# Patient Record
Sex: Male | Born: 1970
Health system: Southern US, Community
[De-identification: ages and names within clinical notes are randomized; demographics above are authoritative.]

## PROBLEM LIST (undated history)

## (undated) DIAGNOSIS — E119 Type 2 diabetes mellitus without complications: Secondary | ICD-10-CM

## (undated) DIAGNOSIS — K219 Gastro-esophageal reflux disease without esophagitis: Secondary | ICD-10-CM

## (undated) DIAGNOSIS — K649 Unspecified hemorrhoids: Secondary | ICD-10-CM

## (undated) DIAGNOSIS — E785 Hyperlipidemia, unspecified: Secondary | ICD-10-CM

## (undated) DIAGNOSIS — I10 Essential (primary) hypertension: Secondary | ICD-10-CM

## (undated) HISTORY — DX: Hyperlipidemia, unspecified: E78.5

## (undated) HISTORY — DX: Unspecified hemorrhoids: K64.9

## (undated) HISTORY — DX: Gastro-esophageal reflux disease without esophagitis: K21.9

---

## 2004-08-27 ENCOUNTER — Ambulatory Visit (HOSPITAL_COMMUNITY): Admission: RE | Admit: 2004-08-27 | Discharge: 2004-08-27 | Payer: Self-pay | Admitting: *Deleted

## 2013-08-10 ENCOUNTER — Ambulatory Visit: Payer: Self-pay | Admitting: Family Medicine

## 2013-08-10 VITALS — BP 118/80 | HR 59 | Temp 97.8°F | Resp 16 | Ht 76.0 in | Wt 266.0 lb

## 2013-08-10 DIAGNOSIS — Z024 Encounter for examination for driving license: Secondary | ICD-10-CM

## 2013-08-10 DIAGNOSIS — Z0289 Encounter for other administrative examinations: Secondary | ICD-10-CM

## 2013-08-10 NOTE — Progress Notes (Signed)
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 He is physically healthy except he has a history of an esophageal stricture about 9 years ago which had to be dilated. He's been on Prilosec for that, but is starting to get some symptoms again. He realizes it is probably can get something done again. He could not recall the name of the gastroenterologist so we gave him the information of people he can contact.  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 Single.  Review of systems: Unremarkable  Physical exam: Well-developed well-nourished tall young man in no major distress. TMs are normal. Eyes PERRLA. Throat clear. Neck supple without nodes. No carotid bruits. Chest clear. Heart regular without murmurs gallops or arrhythmias. Abdomen soft without masses or tenderness. No abdominal bruits. Normal male external genitalia with testes descended. No hernias. Extremities unremarkable. Skin unremarkable except for some scattered moles. On flexion his spine looks fine. Neuro essentially unremarkable.  Plan: DOT card for 2 years  Gave him the name of some gastroenterologists he can contact says he wants to followup on the GERD and esophageal reflux

## 2013-08-10 NOTE — Progress Notes (Signed)
ua-sp gr-1.015, protein-neg, glucose-neg, blood-neg

## 2017-02-28 ENCOUNTER — Encounter: Payer: Self-pay | Admitting: Internal Medicine

## 2017-03-09 ENCOUNTER — Ambulatory Visit (INDEPENDENT_AMBULATORY_CARE_PROVIDER_SITE_OTHER): Payer: 59 | Admitting: Internal Medicine

## 2017-03-09 ENCOUNTER — Encounter: Payer: Self-pay | Admitting: Internal Medicine

## 2017-03-09 VITALS — BP 130/90 | HR 74 | Ht 76.0 in | Wt 259.2 lb

## 2017-03-09 DIAGNOSIS — R151 Fecal smearing: Secondary | ICD-10-CM | POA: Diagnosis not present

## 2017-03-09 DIAGNOSIS — K219 Gastro-esophageal reflux disease without esophagitis: Secondary | ICD-10-CM | POA: Diagnosis not present

## 2017-03-09 DIAGNOSIS — K648 Other hemorrhoids: Secondary | ICD-10-CM

## 2017-03-09 NOTE — Progress Notes (Signed)
Patient ID: Tyrone Lee, male   DOB: 1971/02/22, 46 y.o.   MRN: [id number] HPI: Dolton Shaker is a 46 year old male with past medical history of GERD who self-referred for evaluation of hemorrhoidal symptoms. He is here alone today. He reports that for many years he has had intermittent issues with hemorrhoids. Several months ago he developed hemorrhoidal symptoms that were more persistent and fail to improve as quickly as before. He reports that he feels swollen tissue and bulging tissue particularly after bowel movement. He reports that he feels a "swelling in the anal canal. He endorses perianal itching as well as fecal smearing. He denies rectal bleeding, pain with passing stool and melena. He states that at times he feels that it is hard to "stay clean" after wiping. Bowel habits have not changed and he usually has 2 formed stools per day. Occasionally he will have loose stools with certain foods such as salads. He reports that he has taught himself to not "rush through" a bowel movement. He reports he would often try to have a bowel movement very quickly and move on. He denies abdominal pain. Denies family history of colon polyps and colon cancer. No family history of IBD.  He does report history of intermittent heartburn. He is taking Prilosec 20 mg daily. He reports this controls his symptoms completely. He states it "works great". No dysphagia or odynophagia. No nausea, vomiting or weight loss.  Past Medical History:  Diagnosis Date  . Acid reflux     History reviewed. No pertinent surgical history.  Outpatient Medications Prior to Visit  Medication Sig Dispense Refill  . omeprazole (PRILOSEC) 10 MG capsule Take 10 mg by mouth daily.     No facility-administered medications prior to visit.     Not on File  Family History  Problem Relation Age of Onset  . Alcoholism Mother   . Drug abuse Mother   . Alcoholism Father   . Lung cancer Maternal Grandfather   . Lung cancer  Paternal Grandfather   . Colon cancer Neg Hx   . Stomach cancer Neg Hx   . Esophageal cancer Neg Hx     Social History  Substance Use Topics  . Smoking status: Never Smoker  . Smokeless tobacco: Never Used  . Alcohol use Yes     Comment: weekends    ROS: As per history of present illness, otherwise negative  BP 130/90   Pulse 74   Ht 6\' 4"  (1.93 m)   Wt 259 lb 4 oz (117.6 kg)   BMI 31.56 kg/m  Constitutional: Well-developed and well-nourished. No distress. HEENT: Normocephalic and atraumatic.  Conjunctivae are normal.  No scleral icterus. Neck: Neck supple. Trachea midline. Cardiovascular: Normal rate, regular rhythm and intact distal pulses. No M/R/G Pulmonary/chest: Effort normal and breath sounds normal. No wheezing, rales or rhonchi. Abdominal: Soft, nontender, nondistended. Bowel sounds active throughout.  Rectal: no external lesions or external hemorrhoids are seen, in the posterior midline there is a skin pile with a pinpoint opening without tenderness, fluctuance or drainage, no rectal mass, soft brown stool in the vault Anoscopy: hypertrophied anal papillae and small internal hemorrhoids RA=LL>RP positions  Extremities: no clubbing, cyanosis, or edema Neurological: Alert and oriented to person place and time. Skin: Skin is warm and dry.  Psychiatric: Normal mood and affect. Behavior is normal.   ASSESSMENT/PLAN: 46 year old male with past medical history of GERD who self-referred for evaluation of hemorrhoidal symptoms.  1. Internal hemorrhoids -- cannot rule out a posterior  perirectal abscess which has subsequently healed. There is no evidence of tenderness or active inflammation today. He does have internal hemorrhoids seen by anoscopy. We discussed hemorrhoids and treatment options including banding and surgery. Symptoms include perianal swelling, discomfort, itching and fecal smearing. He denies a history of bleeding. After this discussion including the risks,  benefits and alternatives he would prefer to pursue hemorrhoidal banding.   PROCEDURE NOTE:  The patient presents with symptomatic grade 2 internal hemorrhoids, requesting rubber band ligation of his hemorrhoidal disease.  All risks, benefits and alternative forms of therapy were described and informed consent was obtained.   The anorectum was pre-medicated with 0.125% nitroglycerin ointment The decision was made to band the RA internal hemorrhoid, and the [place] was used to perform band ligation without complication.   Digital anorectal examination was then performed to assure proper positioning of the band, and to adjust the banded tissue as required.  The patient was discharged home without pain or other issues.  Dietary and behavioral recommendations were given and along with follow-up instructions.     The patient will return as scheduled for  follow-up and possible additional banding as required. No complications were encountered and the patient tolerated the procedure well.   2. Need for PCP -- he does not have a primary care physician established. He feels that this is a good idea for him and I agree. We will refer him to [facility] primary care.  3. CRC screening -- average risk, Caucasian male. Screening colonoscopy recommended at age 18.  89. GERD -- GERD without alarm symptoms. He will continue Prilosec. We discussed the risk benefits and alternatives to chronic PPI therapy.

## 2017-03-09 NOTE — Patient Instructions (Addendum)
[place] [facility] Phone [phone].  You have been scheduled for your 2nd hemorrhoidal banding on 05/03/17 @ 3:45 pm   HEMORRHOID [place]   1. The procedure you have had should have been relatively painless since the banding of the area involved does not have nerve endings and there is no pain sensation.  The rubber band cuts off the blood supply to the hemorrhoid and the band may fall off as soon as 48 hours after the banding (the band may occasionally be seen in the toilet bowl following a bowel movement). You may notice a temporary feeling of fullness in the rectum which should respond adequately to plain Tylenol or Motrin.  2. Following the banding, avoid strenuous exercise that evening and resume full activity the next day.  A sitz bath (soaking in a warm tub) or bidet is soothing, and can be useful for cleansing the area after bowel movements.     3. To avoid constipation, take two tablespoons of natural wheat bran, natural oat bran, flax, Benefiber or any over the counter fiber supplement and increase your water intake to 7-8 glasses daily.    4. Unless you have been prescribed anorectal medication, do not put anything inside your rectum for two weeks: No suppositories, enemas, fingers, etc.  5. Occasionally, you may have more bleeding than usual after the banding procedure.  This is often from the untreated hemorrhoids rather than the treated one.  Don't be concerned if there is a tablespoon or so of blood.  If there is more blood than this, lie flat with your bottom higher than your head and apply an ice pack to the area. If the bleeding does not stop within a half an hour or if you feel faint, call our office at [phone] or go to the emergency room.  6. Problems are not common; however, if there is a substantial amount of bleeding, severe pain, chills, fever or difficulty passing urine (very rare) or other problems, you should  call us at [phone] or report to the nearest emergency room.  7. Do not stay seated continuously for more than 2-3 hours for a day or two after the procedure.  Tighten your buttock muscles 10-15 times every two hours and take 10-15 deep breaths every 1-2 hours.  Do not spend more than a few minutes on the toilet if you cannot empty your bowel; instead re-visit the toilet at a later time.

## 2017-04-27 ENCOUNTER — Encounter: Payer: Self-pay | Admitting: *Deleted

## 2017-05-03 ENCOUNTER — Encounter: Payer: Self-pay | Admitting: Internal Medicine

## 2017-05-03 ENCOUNTER — Ambulatory Visit (INDEPENDENT_AMBULATORY_CARE_PROVIDER_SITE_OTHER): Payer: 59 | Admitting: Internal Medicine

## 2017-05-03 VITALS — BP 126/84 | HR 68 | Ht 76.0 in | Wt 255.4 lb

## 2017-05-03 DIAGNOSIS — K648 Other hemorrhoids: Secondary | ICD-10-CM

## 2017-05-03 DIAGNOSIS — R151 Fecal smearing: Secondary | ICD-10-CM

## 2017-05-03 NOTE — Progress Notes (Signed)
Patient ID: Tyrone Lee, male   DOB: January 29, 1971, 46 y.o.   MRN: [id number] [name] is a 46 year old male with a history of symptomatic internal hemorrhoids who was initially seen on 03/09/2017. Hemorrhoidal symptoms prior to the first hemorrhoidal banding included perianal swelling, peripheral pain, itching and fecal smearing Hemorrhoidal banding was performed at last visit to the right anterior internal hemorrhoid  He reports this helped tremendously and his symptoms have improved significantly. He wishes to pursue hemorrhoidal banding again today. He asked to perform the 2 remaining banding's on the same visit today We discussed the slight increased risk of complication when applying to hemorrhoidal bands on the same visit including bleeding and pain. After a thorough discussion of the risks, benefits and alternatives to this therapy he wishes to proceed.   PROCEDURE NOTE:  The patient presents with symptomatic grade 2 internal hemorrhoids, requesting rubber band ligation of his hemorrhoidal disease.  All risks, benefits and alternative forms of therapy were described and informed consent was obtained.   The anorectum was pre-medicated with 0.125% nitroglycerin ointment The decision was made to band the LL and RP (RA done last visit) internal hemorrhoid, and the [place] was used to perform band ligation without complication.   Digital anorectal examination was then performed to assure proper positioning of the band, and to adjust the banded tissue as required.  The patient was discharged home without pain or other issues.  Dietary and behavioral recommendations were given and along with follow-up instructions.     The patient will return as needed. No complications were encountered and the patient tolerated the procedure well.   He is reminded to screening colonoscopy is recommended at age 89, which would be June 2020

## 2017-05-03 NOTE — Patient Instructions (Signed)
You will be due for a recall colonoscopy in 12/2018. We will send you a reminder in the mail when it gets closer to that time.  HEMORRHOID BANDING PROCEDURE    FOLLOW-UP CARE   1. The procedure you have had should have been relatively painless since the banding of the area involved does not have nerve endings and there is no pain sensation.  The rubber band cuts off the blood supply to the hemorrhoid and the band may fall off as soon as 48 hours after the banding (the band may occasionally be seen in the toilet bowl following a bowel movement). You may notice a temporary feeling of fullness in the rectum which should respond adequately to plain Tylenol or Motrin.  2. Following the banding, avoid strenuous exercise that evening and resume full activity the next day.  A sitz bath (soaking in a warm tub) or bidet is soothing, and can be useful for cleansing the area after bowel movements.     3. To avoid constipation, take two tablespoons of natural wheat bran, natural oat bran, flax, Benefiber or any over the counter fiber supplement and increase your water intake to 7-8 glasses daily.    4. Unless you have been prescribed anorectal medication, do not put anything inside your rectum for two weeks: No suppositories, enemas, fingers, etc.  5. Occasionally, you may have more bleeding than usual after the banding procedure.  This is often from the untreated hemorrhoids rather than the treated one.  Don't be concerned if there is a tablespoon or so of blood.  If there is more blood than this, lie flat with your bottom higher than your head and apply an ice pack to the area. If the bleeding does not stop within a half an hour or if you feel faint, call our office at [phone] or go to the emergency room.  6. Problems are not common; however, if there is a substantial amount of bleeding, severe pain, chills, fever or difficulty passing urine (very rare) or other problems, you should call us at [phone]  [phone] or report to the nearest emergency room.  7. Do not stay seated continuously for more than 2-3 hours for a day or two after the procedure.  Tighten your buttock muscles 10-15 times every two hours and take 10-15 deep breaths every 1-2 hours.  Do not spend more than a few minutes on the toilet if you cannot empty your bowel; instead re-visit the toilet at a later time.

## 2017-05-23 ENCOUNTER — Encounter: Payer: 59 | Admitting: Internal Medicine

## 2017-08-04 ENCOUNTER — Ambulatory Visit (INDEPENDENT_AMBULATORY_CARE_PROVIDER_SITE_OTHER): Payer: BLUE CROSS/BLUE SHIELD | Admitting: Family Medicine

## 2017-08-04 ENCOUNTER — Encounter: Payer: Self-pay | Admitting: Family Medicine

## 2017-08-04 ENCOUNTER — Other Ambulatory Visit: Payer: Self-pay

## 2017-08-04 VITALS — BP 141/90 | HR 60 | Temp 98.6°F | Resp 16 | Ht 75.0 in | Wt 250.2 lb

## 2017-08-04 DIAGNOSIS — R030 Elevated blood-pressure reading, without diagnosis of hypertension: Secondary | ICD-10-CM | POA: Diagnosis not present

## 2017-08-04 DIAGNOSIS — E1165 Type 2 diabetes mellitus with hyperglycemia: Secondary | ICD-10-CM | POA: Diagnosis not present

## 2017-08-04 DIAGNOSIS — R739 Hyperglycemia, unspecified: Secondary | ICD-10-CM

## 2017-08-04 DIAGNOSIS — Z13220 Encounter for screening for lipoid disorders: Secondary | ICD-10-CM | POA: Diagnosis not present

## 2017-08-04 DIAGNOSIS — R12 Heartburn: Secondary | ICD-10-CM | POA: Diagnosis not present

## 2017-08-04 LAB — GLUCOSE, POCT (MANUAL RESULT ENTRY): POC Glucose: 121 mg/dl — AB (ref 70–99)

## 2017-08-04 LAB — POCT GLYCOSYLATED HEMOGLOBIN (HGB A1C): Hemoglobin A1C: 11.4

## 2017-08-04 MED ORDER — BLOOD GLUCOSE METER KIT: PACK | 0 refills | Status: AC

## 2017-08-04 MED ORDER — METFORMIN HCL 500 MG PO TABS: 500.0000 mg | ORAL_TABLET | Freq: Two times a day (BID) | ORAL | 1 refills | Status: DC

## 2017-08-04 NOTE — Progress Notes (Addendum)
Subjective:  By signing my name below, I, Tyrone Lee, attest that this documentation has been prepared under the direction and in the presence of Tyrone Agreste, MD Electronically Signed: Ladene Lee, ED Scribe 08/04/2017 at 2:52 PM.   Patient ID: Tyrone Lee, male    DOB: 1971/04/18, 47 y.o.   MRN: [id number]  Chief Complaint  Patient presents with  . Hyperglycemia    patient presents to have lab work following elevated blood sugar during DOT physical, patient denies any sx at this time   HPI Tyrone Lee is a 47 y.o. male who presents to Primary Care at [facility] to f/u on elevated blood glucose. Pt last ate around 6 AM today. Pt recently had a DOT physical through work ~ 1 wk ago, had a finger stick blood glucose of 190, however, he was not fasting at that time. Pt was given a temporary DOT card. Denies polydipsia, polyuria, urinary frequency, visual disturbances, any symptoms. No family h/o DM or HTN. Pt is an occasional drinker ~once/month  Up to 10 beers, last drank over thanksgiving. Pt is a nonsmoker. He is exercising 2-3 days/wk. Denies cp or sob on exertion, lightheadedness, dizziness, weakness, blurred or darkening of vision, numbness/tingling in feet.  GERD Takes Prilosec 10 mg ~2-3 days/wk. Pt has made some behavioral modifications; states he no longer eats within several hours of going to bed. Denies heartburn, h/o stomach ulcer.  Immunizations Pt declines flu vaccine at this visit.   There are no active problems to display for this patient.  Past Medical History:  Diagnosis Date  . Acid reflux   . Hemorrhoids    No past surgical history on file. No Known Allergies Prior to Admission medications   Medication Sig Start Date End Date Taking? Authorizing Provider  omeprazole (PRILOSEC) 10 MG capsule Take 10 mg by mouth daily.    [provider]   Social History   Socioeconomic History  . Marital status: Married    Spouse name: Not on file  .  Number of children: 2  . Years of education: Not on file  . Highest education level: Not on file  Social Needs  . Financial resource strain: Not on file  . Food insecurity - worry: Not on file  . Food insecurity - inability: Not on file  . Transportation needs - medical: Not on file  . Transportation needs - non-medical: Not on file  Occupational History  . Not on file  Tobacco Use  . Smoking status: Never Smoker  . Smokeless tobacco: Never Used  Substance and Sexual Activity  . Alcohol use: Yes    Comment: weekends, occasional  . Drug use: No  . Sexual activity: Not on file  Other Topics Concern  . Not on file  Social History Narrative  . Not on file   Review of Systems  Eyes: Negative for visual disturbance.  Respiratory: Negative for shortness of breath.   Cardiovascular: Negative for chest pain.  Endocrine: Negative for polydipsia and polyuria.  Genitourinary: Negative for frequency.  Neurological: Negative for dizziness, weakness, light-headedness and numbness.      Objective:   Physical Exam  Constitutional: He is oriented to person, place, and time. He appears well-developed and well-nourished.  HENT:  Head: Normocephalic and atraumatic.  Eyes: EOM are normal. Pupils are equal, round, and reactive to light.  Neck: No JVD present. Carotid bruit is not present.  Cardiovascular: Normal rate, regular rhythm and normal heart sounds.  No murmur  heard. Pulmonary/Chest: Effort normal and breath sounds normal. He has no rales.  Musculoskeletal: He exhibits no edema.  Neurological: He is alert and oriented to person, place, and time.  Skin: Skin is warm and dry.  Psychiatric: He has a normal mood and affect.  Vitals reviewed.  Vitals:   08/04/17 1443 08/04/17 1635  BP: (!) 142/100 (!) 141/90  Pulse: 60   Resp: 16   Temp: 98.6 F (37 C)   TempSrc: Oral   SpO2: 99%   Weight: 250 lb 3.2 oz (113.5 kg)   Height: 6' 3"  (1.905 m)    Results for orders placed or  performed in visit on 08/04/17  POCT glucose (manual entry)  Result Value Ref Range   POC Glucose 121 (A) 70 - 99 mg/dl  POCT glycosylated hemoglobin (Hb A1C)  Result Value Ref Range   Hemoglobin A1C 11.4       Assessment & Plan:    Tyrone Lee is a 47 y.o. male Type 2 diabetes mellitus with hyperglycemia, without long-term current use of insulin ([place]) - Plan: Microalbumin, urine, metFORMIN (GLUCOPHAGE) 500 MG tablet, blood glucose meter kit and supplies Hyperglycemia - Plan: POCT glucose (manual entry), POCT glycosylated hemoglobin (Hb A1C), Microalbumin, urine  -Elevated A1c was slightly elevated glucose today consistent with diabetes, new onset. Current glucose reading not consistent with significant elevation in A1c.  -Check microalbumin, start metformin 500 mg daily for now, increasing to twice a day in few days.  -Glucose meter prescribed, monitor home readings and bring record of those to next visit to help decide on further changes.  Screening for hyperlipidemia - Plan: Comprehensive metabolic panel, Lipid panel  Elevated blood pressure reading - Plan: Comprehensive metabolic panel  -Borderline elevated, monitor at repeat visit.  Heartburn  -trigger foods discussed for avoidance, over-the-counter Prilosec as needed  Meds ordered this encounter  Medications  . metFORMIN (GLUCOPHAGE) 500 MG tablet    Sig: Take 1 tablet (500 mg total) by mouth 2 (two) times daily with a meal. Start initially QD for 1 week.    Dispense:  180 tablet    Refill:  1  . blood glucose meter kit and supplies    Sig: Dispense based on patient and insurance preference. Use up to four times daily as directed. (FOR ICD-10 E10.9, E11.9). Once per day for now.    Dispense:  1 each    Refill:  0    Order Specific Question:   Number of strips    Answer:   100    Order Specific Question:   Number of lancets    Answer:   100   Patient Instructions   Blood sugar test in office did indicate  diabetes. Three-month average appears much higher than blood sugar test today. Check your blood sugars once per day at various times including fasting or 2 hours after meals. Bring meter or copy of those readings to follow-up in 2 weeks. Tart metformin once per day for first few days then increase to twice per day if tolerated.  I will check cholesterol and other blood work for screening.   Ok to take Prilosec as needed. See typical foods that can worsen heartburn below.   Diabetes Mellitus and Nutrition When you have diabetes (diabetes mellitus), it is very important to have healthy eating habits because your blood sugar (glucose) levels are greatly affected by what you eat and drink. Eating healthy foods in the appropriate amounts, at about the same times every day,  can help you:  Control your blood glucose.  Lower your risk of heart disease.  Improve your blood pressure.  Reach or maintain a healthy weight.  Every person with diabetes is different, and each person has different needs for a meal plan. Your health care provider may recommend that you work with a diet and nutrition specialist (dietitian) to make a meal plan that is best for you. Your meal plan may vary depending on factors such as:  The calories you need.  The medicines you take.  Your weight.  Your blood glucose, blood pressure, and cholesterol levels.  Your activity level.  Other health conditions you have, such as heart or kidney disease.  How do carbohydrates affect me? Carbohydrates affect your blood glucose level more than any other type of food. Eating carbohydrates naturally increases the amount of glucose in your blood. Carbohydrate counting is a method for keeping track of how many carbohydrates you eat. Counting carbohydrates is important to keep your blood glucose at a healthy level, especially if you use insulin or take certain oral diabetes medicines. It is important to know how many carbohydrates you  can safely have in each meal. This is different for every person. Your dietitian can help you calculate how many carbohydrates you should have at each meal and for snack. Foods that contain carbohydrates include:  Bread, cereal, rice, pasta, and crackers.  Potatoes and corn.  Peas, beans, and lentils.  Milk and yogurt.  Fruit and juice.  Desserts, such as cakes, cookies, ice cream, and candy.  How does alcohol affect me? Alcohol can cause a sudden decrease in blood glucose (hypoglycemia), especially if you use insulin or take certain oral diabetes medicines. Hypoglycemia can be a life-threatening condition. Symptoms of hypoglycemia (sleepiness, dizziness, and confusion) are similar to symptoms of having too much alcohol. If your health care provider says that alcohol is safe for you, follow these guidelines:  Limit alcohol intake to no more than 1 drink per day for nonpregnant women and 2 drinks per day for men. One drink equals 12 oz of beer, 5 oz of wine, or 1 oz of hard liquor.  Do not drink on an empty stomach.  Keep yourself hydrated with water, diet soda, or unsweetened iced tea.  Keep in mind that regular soda, juice, and other mixers may contain a lot of sugar and must be counted as carbohydrates.  What are tips for following this plan? Reading food labels  Start by checking the serving size on the label. The amount of calories, carbohydrates, fats, and other nutrients listed on the label are based on one serving of the food. Many foods contain more than one serving per package.  Check the total grams (g) of carbohydrates in one serving. You can calculate the number of servings of carbohydrates in one serving by dividing the total carbohydrates by 15. For example, if a food has 30 g of total carbohydrates, it would be equal to 2 servings of carbohydrates.  Check the number of grams (g) of saturated and trans fats in one serving. Choose foods that have low or no amount of  these fats.  Check the number of milligrams (mg) of sodium in one serving. Most people should limit total sodium intake to less than 2,300 mg per day.  Always check the nutrition information of foods labeled as "low-fat" or "nonfat". These foods may be higher in added sugar or refined carbohydrates and should be avoided.  Talk to your dietitian to identify  your daily goals for nutrients listed on the label. Shopping  Avoid buying canned, premade, or processed foods. These foods tend to be high in fat, sodium, and added sugar.  Shop around the outside edge of the grocery store. This includes fresh fruits and vegetables, bulk grains, fresh meats, and fresh dairy. Cooking  Use low-heat cooking methods, such as baking, instead of high-heat cooking methods like deep frying.  Cook using healthy oils, such as olive, canola, or sunflower oil.  Avoid cooking with butter, cream, or high-fat meats. Meal planning  Eat meals and snacks regularly, preferably at the same times every day. Avoid going long periods of time without eating.  Eat foods high in fiber, such as fresh fruits, vegetables, beans, and whole grains. Talk to your dietitian about how many servings of carbohydrates you can eat at each meal.  Eat 4-6 ounces of lean protein each day, such as lean meat, chicken, fish, eggs, or tofu. 1 ounce is equal to 1 ounce of meat, chicken, or fish, 1 egg, or 1/4 cup of tofu.  Eat some foods each day that contain healthy fats, such as avocado, nuts, seeds, and fish. Lifestyle   Check your blood glucose regularly.  Exercise at least 30 minutes 5 or more days each week, or as told by your health care provider.  Take medicines as told by your health care provider.  Do not use any products that contain nicotine or tobacco, such as cigarettes and e-cigarettes. If you need help quitting, ask your health care provider.  Work with a Social worker or diabetes educator to identify strategies to manage  stress and any emotional and social challenges. What are some questions to ask my health care provider?  Do I need to meet with a diabetes educator?  Do I need to meet with a dietitian?  What number can I call if I have questions?  When are the best times to check my blood glucose? Where to find more information:  American Diabetes Association: diabetes.org/food-and-fitness/food  Academy of Nutrition and Dietetics: PokerClues.dk  [employer] of Diabetes and Digestive and Kidney Diseases (NIH): ContactWire.be Summary  A healthy meal plan will help you control your blood glucose and maintain a healthy lifestyle.  Working with a diet and nutrition specialist (dietitian) can help you make a meal plan that is best for you.  Keep in mind that carbohydrates and alcohol have immediate effects on your blood glucose levels. It is important to count carbohydrates and to use alcohol carefully. This information is not intended to replace advice given to you by your health care provider. Make sure you discuss any questions you have with your health care provider. Document Released: 03/18/2005 Document Revised: 07/26/2016 Document Reviewed: 07/26/2016 Elsevier Interactive Patient Education  2018 [employer].  Diabetes Mellitus and Standards of Medical Care Managing diabetes (diabetes mellitus) can be complicated. Your diabetes treatment may be managed by a team of health care providers, including:  A diet and nutrition specialist (registered dietitian).  A nurse.  A certified diabetes educator (CDE).  A diabetes specialist (endocrinologist).  An eye doctor.  A primary care provider.  A dentist.  Your health care providers follow a schedule in order to help you get the best quality of care. The following schedule is a general guideline for your diabetes  management plan. Your health care providers may also give you more specific instructions. HbA1c ( hemoglobin A1c) test This test provides information about blood sugar (glucose) control over the previous 2-3 months.  It is used to check whether your diabetes management plan needs to be adjusted.  If you are meeting your treatment goals, this test is done at least 2 times a year.  If you are not meeting treatment goals or if your treatment goals have changed, this test is done 4 times a year.  Blood pressure test  This test is done at every routine medical visit. For most people, the goal is less than 130/80. Ask your health care provider what your goal blood pressure should be. Dental and eye exams  Visit your dentist two times a year.  If you have type 1 diabetes, get an eye exam 3-5 years after you are diagnosed, and then once a year after your first exam. ? If you were diagnosed with type 1 diabetes as a child, get an eye exam when you are age 41 or older and have had diabetes for 3-5 years. After the first exam, you should get an eye exam once a year.  If you have type 2 diabetes, have an eye exam as soon as you are diagnosed, and then once a year after your first exam. Foot care exam  Visual foot exams are done at every routine medical visit. The exams check for cuts, bruises, redness, blisters, sores, or other problems with the feet.  A complete foot exam is done by your health care provider once a year. This exam includes an inspection of the structure and skin of your feet, and a check of the pulses and sensation in your feet. ? Type 1 diabetes: Get your first exam 3-5 years after diagnosis. ? Type 2 diabetes: Get your first exam as soon as you are diagnosed.  Check your feet every day for cuts, bruises, redness, blisters, or sores. If you have any of these or other problems that are not healing, contact your health care provider. Kidney function test ( urine  microalbumin)  This test is done once a year. ? Type 1 diabetes: Get your first test 5 years after diagnosis. ? Type 2 diabetes: Get your first test as soon as you are diagnosed.  If you have chronic kidney disease (CKD), get a serum creatinine and estimated glomerular filtration rate (eGFR) test once a year. Lipid profile (cholesterol, HDL, LDL, triglycerides)  This test should be done when you are diagnosed with diabetes, and every 5 years after the first test. If you are on medicines to lower your cholesterol, you may need to get this test done every year. ? The goal for LDL is less than 100 mg/dL (5.5 mmol/L). If you are at high risk, the goal is less than 70 mg/dL (3.9 mmol/L). ? The goal for HDL is 40 mg/dL (2.2 mmol/L) for men and 50 mg/dL(2.8 mmol/L) for women. An HDL cholesterol of 60 mg/dL (3.3 mmol/L) or higher gives some protection against heart disease. ? The goal for triglycerides is less than 150 mg/dL (8.3 mmol/L). Immunizations  The yearly flu (influenza) vaccine is recommended for everyone 6 months or older who has diabetes.  The pneumonia (pneumococcal) vaccine is recommended for everyone 2 years or older who has diabetes. If you are 79 or older, you may get the pneumonia vaccine as a series of two separate shots.  The hepatitis B vaccine is recommended for adults shortly after they have been diagnosed with diabetes.  The Tdap (tetanus, diphtheria, and pertussis) vaccine should be given: ? According to normal childhood vaccination schedules, for children. ? Every 10 years, for adults who  have diabetes.  The shingles vaccine is recommended for people who have had chicken pox and are 50 years or older. Mental and emotional health  Screening for symptoms of eating disorders, anxiety, and depression is recommended at the time of diagnosis and afterward as needed. If your screening shows that you have symptoms (you have a positive screening result), you may need further  evaluation and be referred to a mental health care provider. Diabetes self-management education  Education about how to manage your diabetes is recommended at diagnosis and ongoing as needed. Treatment plan  Your treatment plan will be reviewed at every medical visit. Summary  Managing diabetes (diabetes mellitus) can be complicated. Your diabetes treatment may be managed by a team of health care providers.  Your health care providers follow a schedule in order to help you get the best quality of care.  Standards of care including having regular physical exams, blood tests, blood pressure monitoring, immunizations, screening tests, and education about how to manage your diabetes.  Your health care providers may also give you more specific instructions based on your individual health. This information is not intended to replace advice given to you by your health care provider. Make sure you discuss any questions you have with your health care provider. Document Released: 04/18/2009 Document Revised: 03/19/2016 Document Reviewed: 03/19/2016 Elsevier Interactive Patient Education  2018 [employer] for Gastroesophageal Reflux Disease, Adult When you have gastroesophageal reflux disease (GERD), the foods you eat and your eating habits are very important. Choosing the right foods can help ease your discomfort. What guidelines do I need to follow?  Choose fruits, vegetables, whole grains, and low-fat dairy products.  Choose low-fat meat, fish, and poultry.  Limit fats such as oils, salad dressings, butter, nuts, and avocado.  Keep a food diary. This helps you identify foods that cause symptoms.  Avoid foods that cause symptoms. These may be different for everyone.  Eat small meals often instead of 3 large meals a day.  Eat your meals slowly, in a place where you are relaxed.  Limit fried foods.  Cook foods using methods other than frying.  Avoid drinking  alcohol.  Avoid drinking large amounts of liquids with your meals.  Avoid bending over or lying down until 2-3 hours after eating. What foods are not recommended? These are some foods and drinks that may make your symptoms worse: Vegetables Tomatoes. Tomato juice. Tomato and spaghetti sauce. Chili peppers. Onion and garlic. Horseradish. Fruits Oranges, grapefruit, and lemon (fruit and juice). Meats High-fat meats, fish, and poultry. This includes hot dogs, ribs, ham, sausage, salami, and bacon. Dairy Whole milk and chocolate milk. Sour cream. Cream. Butter. Ice cream. Cream cheese. Drinks Coffee and tea. Bubbly (carbonated) drinks or energy drinks. Condiments Hot sauce. Barbecue sauce. Sweets/Desserts Chocolate and cocoa. Donuts. Peppermint and spearmint. Fats and Oils High-fat foods. This includes [place] fries and potato chips. Other Vinegar. Strong spices. This includes black pepper, white pepper, red pepper, cayenne, curry powder, cloves, ginger, and chili powder. The items listed above may not be a complete list of foods and drinks to avoid. Contact your dietitian for more information. This information is not intended to replace advice given to you by your health care provider. Make sure you discuss any questions you have with your health care provider. Document Released: 12/21/2011 Document Revised: 11/27/2015 Document Reviewed: 04/25/2013 Elsevier Interactive Patient Education  2017 [employer].   IF you received an x-ray today, you will receive an  invoice from [facility]. Please contact [facility] at [phone] with questions or concerns regarding your invoice.   IF you received labwork today, you will receive an invoice from [facility]. Please contact [facility] at [phone] with questions or concerns regarding your invoice.   Our billing staff will not be able to assist you with questions regarding bills from these companies.  You will be contacted  with the lab results as soon as they are available. The fastest way to get your results is to activate your My Chart account. Instructions are located on the last page of this paperwork. If you have not heard from [place] regarding the results in 2 weeks, please contact this office.      I personally performed the services described in this documentation, which was scribed in my presence. The recorded information has been reviewed and considered for accuracy and completeness, addended by me as needed, and agree with information above.  Signed,   Merri Ray, MD [facility] at [place].  08/04/17 4:32 PM

## 2017-08-04 NOTE — Patient Instructions (Addendum)
Blood sugar test in office did indicate diabetes. Three-month average appears much higher than blood sugar test today. Check your blood sugars once per day at various times including fasting or 2 hours after meals. Bring meter or copy of those readings to follow-up in 2 weeks. Tart metformin once per day for first few days then increase to twice per day if tolerated.  I will check cholesterol and other blood work for screening.   Ok to take Prilosec as needed. See typical foods that can worsen heartburn below.   Diabetes Mellitus and Nutrition When you have diabetes (diabetes mellitus), it is very important to have healthy eating habits because your blood sugar (glucose) levels are greatly affected by what you eat and drink. Eating healthy foods in the appropriate amounts, at about the same times every day, can help you:  Control your blood glucose.  Lower your risk of heart disease.  Improve your blood pressure.  Reach or maintain a healthy weight.  Every person with diabetes is different, and each person has different needs for a meal plan. Your health care provider may recommend that you work with a diet and nutrition specialist (dietitian) to make a meal plan that is best for you. Your meal plan may vary depending on factors such as:  The calories you need.  The medicines you take.  Your weight.  Your blood glucose, blood pressure, and cholesterol levels.  Your activity level.  Other health conditions you have, such as heart or kidney disease.  How do carbohydrates affect me? Carbohydrates affect your blood glucose level more than any other type of food. Eating carbohydrates naturally increases the amount of glucose in your blood. Carbohydrate counting is a method for keeping track of how many carbohydrates you eat. Counting carbohydrates is important to keep your blood glucose at a healthy level, especially if you use insulin or take certain oral diabetes medicines. It is  important to know how many carbohydrates you can safely have in each meal. This is different for every person. Your dietitian can help you calculate how many carbohydrates you should have at each meal and for snack. Foods that contain carbohydrates include:  Bread, cereal, rice, pasta, and crackers.  Potatoes and corn.  Peas, beans, and lentils.  Milk and yogurt.  Fruit and juice.  Desserts, such as cakes, cookies, ice cream, and candy.  How does alcohol affect me? Alcohol can cause a sudden decrease in blood glucose (hypoglycemia), especially if you use insulin or take certain oral diabetes medicines. Hypoglycemia can be a life-threatening condition. Symptoms of hypoglycemia (sleepiness, dizziness, and confusion) are similar to symptoms of having too much alcohol. If your health care provider says that alcohol is safe for you, follow these guidelines:  Limit alcohol intake to no more than 1 drink per day for nonpregnant women and 2 drinks per day for men. One drink equals 12 oz of beer, 5 oz of wine, or 1 oz of hard liquor.  Do not drink on an empty stomach.  Keep yourself hydrated with water, diet soda, or unsweetened iced tea.  Keep in mind that regular soda, juice, and other mixers may contain a lot of sugar and must be counted as carbohydrates.  What are tips for following this plan? Reading food labels  Start by checking the serving size on the label. The amount of calories, carbohydrates, fats, and other nutrients listed on the label are based on one serving of the food. Many foods contain more than one  serving per package.  Check the total grams (g) of carbohydrates in one serving. You can calculate the number of servings of carbohydrates in one serving by dividing the total carbohydrates by 15. For example, if a food has 30 g of total carbohydrates, it would be equal to 2 servings of carbohydrates.  Check the number of grams (g) of saturated and trans fats in one serving.  Choose foods that have low or no amount of these fats.  Check the number of milligrams (mg) of sodium in one serving. Most people should limit total sodium intake to less than 2,300 mg per day.  Always check the nutrition information of foods labeled as "low-fat" or "nonfat". These foods may be higher in added sugar or refined carbohydrates and should be avoided.  Talk to your dietitian to identify your daily goals for nutrients listed on the label. Shopping  Avoid buying canned, premade, or processed foods. These foods tend to be high in fat, sodium, and added sugar.  Shop around the outside edge of the grocery store. This includes fresh fruits and vegetables, bulk grains, fresh meats, and fresh dairy. Cooking  Use low-heat cooking methods, such as baking, instead of high-heat cooking methods like deep frying.  Cook using healthy oils, such as olive, canola, or sunflower oil.  Avoid cooking with butter, cream, or high-fat meats. Meal planning  Eat meals and snacks regularly, preferably at the same times every day. Avoid going long periods of time without eating.  Eat foods high in fiber, such as fresh fruits, vegetables, beans, and whole grains. Talk to your dietitian about how many servings of carbohydrates you can eat at each meal.  Eat 4-6 ounces of lean protein each day, such as lean meat, chicken, fish, eggs, or tofu. 1 ounce is equal to 1 ounce of meat, chicken, or fish, 1 egg, or 1/4 cup of tofu.  Eat some foods each day that contain healthy fats, such as avocado, nuts, seeds, and fish. Lifestyle   Check your blood glucose regularly.  Exercise at least 30 minutes 5 or more days each week, or as told by your health care provider.  Take medicines as told by your health care provider.  Do not use any products that contain nicotine or tobacco, such as cigarettes and e-cigarettes. If you need help quitting, ask your health care provider.  Work with a Social worker or diabetes  educator to identify strategies to manage stress and any emotional and social challenges. What are some questions to ask my health care provider?  Do I need to meet with a diabetes educator?  Do I need to meet with a dietitian?  What number can I call if I have questions?  When are the best times to check my blood glucose? Where to find more information:  American Diabetes Association: diabetes.org/food-and-fitness/food  Academy of Nutrition and Dietetics: PokerClues.dk  [employer] of Diabetes and Digestive and Kidney Diseases (NIH): ContactWire.be Summary  A healthy meal plan will help you control your blood glucose and maintain a healthy lifestyle.  Working with a diet and nutrition specialist (dietitian) can help you make a meal plan that is best for you.  Keep in mind that carbohydrates and alcohol have immediate effects on your blood glucose levels. It is important to count carbohydrates and to use alcohol carefully. This information is not intended to replace advice given to you by your health care provider. Make sure you discuss any questions you have with your health care provider. Document  Released: 03/18/2005 Document Revised: 07/26/2016 Document Reviewed: 07/26/2016 Elsevier Interactive Patient Education  2018 [employer].  Diabetes Mellitus and Standards of Medical Care Managing diabetes (diabetes mellitus) can be complicated. Your diabetes treatment may be managed by a team of health care providers, including:  A diet and nutrition specialist (registered dietitian).  A nurse.  A certified diabetes educator (CDE).  A diabetes specialist (endocrinologist).  An eye doctor.  A primary care provider.  A dentist.  Your health care providers follow a schedule in order to help you get the best quality of care. The following schedule is  a general guideline for your diabetes management plan. Your health care providers may also give you more specific instructions. HbA1c ( hemoglobin A1c) test This test provides information about blood sugar (glucose) control over the previous 2-3 months. It is used to check whether your diabetes management plan needs to be adjusted.  If you are meeting your treatment goals, this test is done at least 2 times a year.  If you are not meeting treatment goals or if your treatment goals have changed, this test is done 4 times a year.  Blood pressure test  This test is done at every routine medical visit. For most people, the goal is less than 130/80. Ask your health care provider what your goal blood pressure should be. Dental and eye exams  Visit your dentist two times a year.  If you have type 1 diabetes, get an eye exam 3-5 years after you are diagnosed, and then once a year after your first exam. ? If you were diagnosed with type 1 diabetes as a child, get an eye exam when you are age 57 or older and have had diabetes for 3-5 years. After the first exam, you should get an eye exam once a year.  If you have type 2 diabetes, have an eye exam as soon as you are diagnosed, and then once a year after your first exam. Foot care exam  Visual foot exams are done at every routine medical visit. The exams check for cuts, bruises, redness, blisters, sores, or other problems with the feet.  A complete foot exam is done by your health care provider once a year. This exam includes an inspection of the structure and skin of your feet, and a check of the pulses and sensation in your feet. ? Type 1 diabetes: Get your first exam 3-5 years after diagnosis. ? Type 2 diabetes: Get your first exam as soon as you are diagnosed.  Check your feet every day for cuts, bruises, redness, blisters, or sores. If you have any of these or other problems that are not healing, contact your health care provider. Kidney  function test ( urine microalbumin)  This test is done once a year. ? Type 1 diabetes: Get your first test 5 years after diagnosis. ? Type 2 diabetes: Get your first test as soon as you are diagnosed.  If you have chronic kidney disease (CKD), get a serum creatinine and estimated glomerular filtration rate (eGFR) test once a year. Lipid profile (cholesterol, HDL, LDL, triglycerides)  This test should be done when you are diagnosed with diabetes, and every 5 years after the first test. If you are on medicines to lower your cholesterol, you may need to get this test done every year. ? The goal for LDL is less than 100 mg/dL (5.5 mmol/L). If you are at high risk, the goal is less than 70 mg/dL (3.9 mmol/L). ? The  goal for HDL is 40 mg/dL (2.2 mmol/L) for men and 50 mg/dL(2.8 mmol/L) for women. An HDL cholesterol of 60 mg/dL (3.3 mmol/L) or higher gives some protection against heart disease. ? The goal for triglycerides is less than 150 mg/dL (8.3 mmol/L). Immunizations  The yearly flu (influenza) vaccine is recommended for everyone 6 months or older who has diabetes.  The pneumonia (pneumococcal) vaccine is recommended for everyone 2 years or older who has diabetes. If you are 17 or older, you may get the pneumonia vaccine as a series of two separate shots.  The hepatitis B vaccine is recommended for adults shortly after they have been diagnosed with diabetes.  The Tdap (tetanus, diphtheria, and pertussis) vaccine should be given: ? According to normal childhood vaccination schedules, for children. ? Every 10 years, for adults who have diabetes.  The shingles vaccine is recommended for people who have had chicken pox and are 50 years or older. Mental and emotional health  Screening for symptoms of eating disorders, anxiety, and depression is recommended at the time of diagnosis and afterward as needed. If your screening shows that you have symptoms (you have a positive screening result),  you may need further evaluation and be referred to a mental health care provider. Diabetes self-management education  Education about how to manage your diabetes is recommended at diagnosis and ongoing as needed. Treatment plan  Your treatment plan will be reviewed at every medical visit. Summary  Managing diabetes (diabetes mellitus) can be complicated. Your diabetes treatment may be managed by a team of health care providers.  Your health care providers follow a schedule in order to help you get the best quality of care.  Standards of care including having regular physical exams, blood tests, blood pressure monitoring, immunizations, screening tests, and education about how to manage your diabetes.  Your health care providers may also give you more specific instructions based on your individual health. This information is not intended to replace advice given to you by your health care provider. Make sure you discuss any questions you have with your health care provider. Document Released: 04/18/2009 Document Revised: 03/19/2016 Document Reviewed: 03/19/2016 Elsevier Interactive Patient Education  2018 [place] for Gastroesophageal Reflux Disease, Adult When you have gastroesophageal reflux disease (GERD), the foods you eat and your eating habits are very important. Choosing the right foods can help ease your discomfort. What guidelines do I need to follow?  Choose fruits, vegetables, whole grains, and low-fat dairy products.  Choose low-fat meat, fish, and poultry.  Limit fats such as oils, salad dressings, butter, nuts, and avocado.  Keep a food diary. This helps you identify foods that cause symptoms.  Avoid foods that cause symptoms. These may be different for everyone.  Eat small meals often instead of 3 large meals a day.  Eat your meals slowly, in a place where you are relaxed.  Limit fried foods.  Cook foods using methods other than  frying.  Avoid drinking alcohol.  Avoid drinking large amounts of liquids with your meals.  Avoid bending over or lying down until 2-3 hours after eating. What foods are not recommended? These are some foods and drinks that may make your symptoms worse: Vegetables Tomatoes. Tomato juice. Tomato and spaghetti sauce. Chili peppers. Onion and garlic. Horseradish. Fruits Oranges, grapefruit, and lemon (fruit and juice). Meats High-fat meats, fish, and poultry. This includes hot dogs, ribs, ham, sausage, salami, and bacon. Dairy Whole milk and chocolate milk. Sour cream.  Cream. Butter. Ice cream. Cream cheese. Drinks Coffee and tea. Bubbly (carbonated) drinks or energy drinks. Condiments Hot sauce. Barbecue sauce. Sweets/Desserts Chocolate and cocoa. Donuts. Peppermint and spearmint. Fats and Oils High-fat foods. This includes [place] fries and potato chips. Other Vinegar. Strong spices. This includes black pepper, white pepper, red pepper, cayenne, curry powder, cloves, ginger, and chili powder. The items listed above may not be a complete list of foods and drinks to avoid. Contact your dietitian for more information. This information is not intended to replace advice given to you by your health care provider. Make sure you discuss any questions you have with your health care provider. Document Released: 12/21/2011 Document Revised: 11/27/2015 Document Reviewed: 04/25/2013 Elsevier Interactive Patient Education  2017 [employer].   IF you received an x-ray today, you will receive an invoice from [facility]. Please contact [facility] at [phone] with questions or concerns regarding your invoice.   IF you received labwork today, you will receive an invoice from [facility]. Please contact [facility] at [phone] with questions or concerns regarding your invoice.   Our billing staff will not be able to assist you with questions regarding bills from these  companies.  You will be contacted with the lab results as soon as they are available. The fastest way to get your results is to activate your My Chart account. Instructions are located on the last page of this paperwork. If you have not heard from [place] regarding the results in 2 weeks, please contact this office.

## 2017-08-05 LAB — LIPID PANEL
CHOLESTEROL TOTAL: 242 mg/dL — AB (ref 100–199)
Chol/HDL Ratio: 6.7 ratio — ABNORMAL HIGH (ref 0.0–5.0)
HDL: 36 mg/dL — AB (ref >39)
LDL Calculated: 178 mg/dL — ABNORMAL HIGH (ref 0–99)
TRIGLYCERIDES: 142 mg/dL (ref 0–149)
VLDL Cholesterol Cal: 28 mg/dL (ref 5–40)

## 2017-08-05 LAB — COMPREHENSIVE METABOLIC PANEL
ALK PHOS: 85 IU/L (ref 39–117)
ALT: 45 IU/L — AB (ref 0–44)
AST: 22 IU/L (ref 0–40)
Albumin/Globulin Ratio: 1.5 (ref 1.2–2.2)
Albumin: 4.6 g/dL (ref 3.5–5.5)
BUN/Creatinine Ratio: 11 (ref 9–20)
BUN: 10 mg/dL (ref 6–24)
Bilirubin Total: 1 mg/dL (ref 0.0–1.2)
CO2: 20 mmol/L (ref 20–29)
Calcium: 10.9 mg/dL — ABNORMAL HIGH (ref 8.7–10.2)
Chloride: 103 mmol/L (ref 96–106)
Creatinine, Ser: 0.94 mg/dL (ref 0.76–1.27)
GFR calc Af Amer: 110 mL/min/{1.73_m2} (ref >59)
GFR calc non Af Amer: 95 mL/min/{1.73_m2} (ref >59)
GLOBULIN, TOTAL: 3 g/dL (ref 1.5–4.5)
Glucose: 139 mg/dL — ABNORMAL HIGH (ref 65–99)
Potassium: 4.3 mmol/L (ref 3.5–5.2)
Sodium: 142 mmol/L (ref 134–144)
Total Protein: 7.6 g/dL (ref 6.0–8.5)

## 2017-08-05 LAB — MICROALBUMIN, URINE: Microalbumin, Urine: 8.6 ug/mL

## 2017-08-10 NOTE — Progress Notes (Signed)
Lab letter sent 

## 2017-08-19 ENCOUNTER — Ambulatory Visit (INDEPENDENT_AMBULATORY_CARE_PROVIDER_SITE_OTHER): Payer: BLUE CROSS/BLUE SHIELD | Admitting: Family Medicine

## 2017-08-19 ENCOUNTER — Encounter: Payer: Self-pay | Admitting: Family Medicine

## 2017-08-19 VITALS — BP 138/84 | HR 89 | Temp 98.5°F | Resp 18 | Ht 75.0 in | Wt 244.4 lb

## 2017-08-19 DIAGNOSIS — E785 Hyperlipidemia, unspecified: Secondary | ICD-10-CM | POA: Diagnosis not present

## 2017-08-19 DIAGNOSIS — E119 Type 2 diabetes mellitus without complications: Secondary | ICD-10-CM | POA: Diagnosis not present

## 2017-08-19 MED ORDER — ATORVASTATIN CALCIUM 10 MG PO TABS: 10.0000 mg | ORAL_TABLET | Freq: Every day | ORAL | 1 refills | Status: DC

## 2017-08-19 NOTE — Progress Notes (Signed)
Subjective:  By signing my name below, I, Moises Blood, attest that this documentation has been prepared under the direction and in the presence of Merri Ray, MD. Electronically Signed: Moises Blood, [place]. 08/19/2017 , 5:20 PM .  Patient was seen in Room 9 .   Patient ID: Tyrone Lee, male    DOB: 1970-10-24, 47 y.o.   MRN: [id number] Chief Complaint  Patient presents with  . Diabetes    2 week follow up; states he has no issues with medication since last OV   HPI Tyrone Lee is a 47 y.o. male   Diabetes Here for follow up of new diagnosis of diabetes. I saw him 2 weeks ago, after he had DOT physical 1 week prior with blood sugar of 190. At last visit, his A1C was significantly elevated at 11.4 but in office glucose was only 121. He was started on metformin 565m QD with plan to increase to BID. Home meter provided. Urine micro albumin overall okay at 8.6. CMP with borderline calcium and borderline ALT, glucose 139.   He reports he's been checking his blood sugar, running around 100s. He brought in his meter, running 102 this morning; fasting running around low 100s up to high of 164 (5 days ago). He believes high of 164 was due to forgetting to take medication that morning.   He's changed up his diet, eating more salads and changed how he cooks. 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 He denies any complications with metformin. He notes he had some watery stools but has improved; believed due to change in diet. He drinks socially, about once a month. He denies any numbness or tingling in extremities, lightheadedness, dizziness or headaches.   He isn't followed by an eye doctor.  He is followed by a dentist, seen 2 days ago.  Flu shot: he declines flu shot today.  Pneumonia vaccine: he declines vaccine today.   Lipid screening Lab Results  Component Value Date   CHOL 242 (H) 08/04/2017   HDL 36 (L) 08/04/2017   LDLCALC 178  (H) 08/04/2017   TRIG 142 08/04/2017   CHOLHDL 6.7 (H) 08/04/2017   Recommended starting a statin, especially with diabetes, may need high intensity. He is currently not on a statin.   There are no active problems to display for this patient.  Past Medical History:  Diagnosis Date  . Acid reflux   . Hemorrhoids    No past surgical history on file. No Known Allergies Prior to Admission medications   Medication Sig Start Date End Date Taking? Authorizing Provider  blood glucose meter kit and supplies Dispense based on patient and insurance preference. Use up to four times daily as directed. (FOR ICD-10 E10.9, E11.9). Once per day for now. 08/04/17  Yes GWendie Agreste MD  metFORMIN (GLUCOPHAGE) 500 MG tablet Take 1 tablet (500 mg total) by mouth 2 (two) times daily with a meal. Start initially QD for 1 week. 08/04/17  Yes GWendie Agreste MD  omeprazole (PRILOSEC) 10 MG capsule Take 10 mg by mouth daily.   Yes [provider]   Social History   Socioeconomic History  . Marital status: Married    Spouse name: Not on file  . Number of children: 2  . Years of education: Not on file  . Highest education level: Not on file  Social Needs  . Financial resource strain: Not on file  . Food insecurity -  worry: Not on file  . Food insecurity - inability: Not on file  . Transportation needs - medical: Not on file  . Transportation needs - non-medical: Not on file  Occupational History  . Not on file  Tobacco Use  . Smoking status: Never Smoker  . Smokeless tobacco: Never Used  Substance and Sexual Activity  . Alcohol use: Yes    Comment: weekends, occasional  . Drug use: No  . Sexual activity: Not on file  Other Topics Concern  . Not on file  Social History Narrative  . Not on file   Review of Systems  Constitutional: Negative for fatigue and unexpected weight change.  Eyes: Negative for visual disturbance.  Respiratory: Negative for cough, chest tightness and  shortness of breath.   Cardiovascular: Negative for chest pain, palpitations and leg swelling.  Gastrointestinal: Negative for abdominal pain and blood in stool.  Neurological: Negative for dizziness, light-headedness and headaches.       Objective:   Physical Exam  Constitutional: He is oriented to person, place, and time. He appears well-developed and well-nourished.  HENT:  Head: Normocephalic and atraumatic.  Eyes: EOM are normal. Pupils are equal, round, and reactive to light.  Neck: No JVD present. Carotid bruit is not present.  Cardiovascular: Normal rate, regular rhythm and normal heart sounds.  No murmur heard. Pulmonary/Chest: Effort normal and breath sounds normal. He has no rales.  Musculoskeletal: He exhibits no edema.  Neurological: He is alert and oriented to person, place, and time.  Skin: Skin is warm and dry.  Psychiatric: He has a normal mood and affect.  Vitals reviewed.   Vitals:   08/19/17 1648  BP: 138/84  Pulse: 89  Resp: 18  Temp: 98.5 F (36.9 C)  TempSrc: Oral  SpO2: 99%  Weight: 244 lb 6.4 oz (110.9 kg)  Height: 6' 3"  (1.905 m)   Diabetic Foot Exam - Simple   Simple Foot Form Diabetic Foot exam was performed with the following findings:  Yes 08/19/2017  5:34 PM  Visual Inspection No deformities, no ulcerations, no other skin breakdown bilaterally:  Yes Sensation Testing Intact to touch and monofilament testing bilaterally:  Yes Pulse Check Posterior Tibialis and Dorsalis pulse intact bilaterally:  Yes Comments        Assessment & Plan:    Tyrone Lee is a 47 y.o. male Hyperlipidemia, unspecified hyperlipidemia type - Plan: atorvastatin (LIPITOR) 10 MG tablet  - Based on previous readings and diagnosis of diabetes, discussed need for statin. Potentially could recommend high intensity statin given ASCVD risk with diabetes.   -Start at Lipitor 10 mg daily, recheck levels at next visit.  Type 2 diabetes mellitus without  complication, without long-term current use of insulin ([place]) - Plan: Ambulatory referral to Ophthalmology  -Tolerating metformin, home readings improving with medication and diet  -Letter completed for his DOT medical examiner given stability of readings recently.   - flu and pneumonia vaccines declined.  Meds ordered this encounter  Medications  . atorvastatin (LIPITOR) 10 MG tablet    Sig: Take 1 tablet (10 mg total) by mouth daily at 6 PM.    Dispense:  90 tablet    Refill:  1   Patient Instructions   No change in metformin dose for now. I would recommend starting Lipitor once per day for cholesterol with repeat testing in next approximately 10 weeks.   Let me know if you change your mind about the flu or pneumonia vaccines.  Thanks for coming in today. I completed a letter for your DOT examiner, but let me know if other information is needed.   Type 2 Diabetes Mellitus, Self Care, Adult When you have type 2 diabetes (type 2 diabetes mellitus), you must keep your blood sugar (glucose) under control. You can do this with:  Nutrition.  Exercise.  Lifestyle changes.  Medicines or insulin, if needed.  Support from your doctors and others.  How do I manage my blood sugar?  Check your blood sugar level every day, as often as told.  Call your doctor if your blood sugar is above your goal numbers for 2 tests in a row.  Have your A1c (hemoglobin A1c) level checked at least two times a year. Have it checked more often if your doctor tells you to. Your doctor will set treatment goals for you. Generally, you should have these blood sugar levels:  Before meals (preprandial): 80-130 mg/dL (4.4-7.2 mmol/L).  After meals (postprandial): lower than 180 mg/dL (10 mmol/L).  A1c level: less than 7%.  What do I need to know about high blood sugar? High blood sugar is called hyperglycemia. Know the signs of high blood sugar. Signs may  include:  Feeling: ? Thirsty. ? Hungry. ? Very tired.  Needing to pee (urinate) more than usual.  Blurry vision.  What do I need to know about low blood sugar? Low blood sugar is called hypoglycemia. This is when blood sugar is at or below 70 mg/dL (3.9 mmol/L). Symptoms may include:  Feeling: ? Hungry. ? Worried or nervous (anxious). ? Sweaty and clammy. ? Confused. ? Dizzy. ? Sleepy. ? Sick to your stomach (nauseous).  Having: ? A fast heartbeat (palpitations). ? A headache. ? A change in your vision. ? Jerky movements that you cannot control (seizure). ? Nightmares. ? Tingling or no feeling (numbness) around the mouth, lips, or tongue.  Having trouble with: ? Talking. ? Paying attention (concentrating). ? Moving (coordination). ? Sleeping.  Shaking.  Passing out (fainting).  Getting upset easily (irritability).  Treating low blood sugar  To treat low blood sugar, eat or drink something sugary right away. If you can think clearly and swallow safely, follow the 15:15 rule:  Take 15 grams of a fast-acting carb (carbohydrate). Some fast-acting carbs are: ? 1 tube of glucose gel. ? 3 sugar tablets (glucose pills). ? 6-8 pieces of hard candy. ? 4 oz (120 mL) of fruit juice. ? 4 oz (120 mL) regular (not diet) soda.  Check your blood sugar 15 minutes after you take the carb.  If your blood sugar is still at or below 70 mg/dL (3.9 mmol/L), take 15 grams of a carb again.  If your blood sugar does not go above 70 mg/dL (3.9 mmol/L) after 3 tries, get help right away.  After your blood sugar goes back to normal, eat a meal or a snack within 1 hour.  Treating very low blood sugar If your blood sugar is at or below 54 mg/dL (3 mmol/L), you have very low blood sugar (severe hypoglycemia). This is an emergency. Do not wait to see if the symptoms will go away. Get medical help right away. Call your local emergency services (911 in the [place] Do not drive yourself to  the hospital. If you have very low blood sugar and you cannot eat or drink, you may need a glucagon shot (injection). A family member or friend should learn how to check your blood sugar and how to give you a  glucagon shot. Ask your doctor if you need to have a glucagon shot kit at home. What else is important to manage my diabetes? Medicine Follow these instructions about insulin and diabetes medicines:  Take them as told by your doctor.  Adjust them as told by your doctor.  Do not run out of them.  Having diabetes can raise your risk for other long-term conditions. These include heart or kidney disease. Your doctor may prescribe medicines to help prevent problems from diabetes. Food   Make healthy food choices. These include: ? Chicken, fish, egg whites, and beans. ? Oats, whole wheat, bulgur, brown rice, quinoa, and millet. ? Fresh fruits and vegetables. ? Low-fat dairy products. ? Nuts, avocado, olive oil, and canola oil.  Make a food plan with a specialist (dietitian).  Follow instructions from your doctor about what you cannot eat or drink.  Drink enough fluid to keep your pee (urine) clear or pale yellow.  Eat healthy snacks between healthy meals.  Keep track of carbs that you eat. Read food labels. Learn food serving sizes.  Follow your sick day plan when you cannot eat or drink normally. Make this plan with your doctor so it is ready to use. Activity  Exercise at least 3 times a week.  Do not go more than 2 days without exercising.  Talk with your doctor before you start a new exercise. Your doctor may need to adjust your insulin, medicines, or food. Lifestyle   Do not use any tobacco products. These include cigarettes, chewing tobacco, and e-cigarettes.If you need help quitting, ask your doctor.  Ask your doctor how much alcohol is safe for you.  Learn to deal with stress. If you need help with this, ask your doctor. Body care  Stay up to date with your  shots (immunizations).  Have your eyes and feet checked by a doctor as often as told.  Check your skin and feet every day. Check for cuts, bruises, redness, blisters, or sores.  Brush your teeth and gums two times a day.  Floss at least one time a day.  Go to the dentist least one time every 6 months.  Stay at a healthy weight. General instructions   Take over-the-counter and prescription medicines only as told by your doctor.  Share your diabetes care plan with: ? Your work or school. ? People you live with.  Check your pee (urine) for ketones: ? When you are sick. ? As told by your doctor.  Carry a card or wear jewelry that says that you have diabetes.  Ask your doctor: ? Do I need to meet with a diabetes educator? ? Where can I find a support group for people with diabetes?  Keep all follow-up visits as told by your doctor. This is important. Where to find more information: To learn more about diabetes, visit:  American Diabetes Association: www.diabetes.org  American Association of Diabetes Educators: www.diabeteseducator.org/patient-resources  This information is not intended to replace advice given to you by your health care provider. Make sure you discuss any questions you have with your health care provider. Document Released: 10/13/2015 Document Revised: 11/27/2015 Document Reviewed: 07/25/2015 Elsevier Interactive Patient Education  2018 [employer].     IF you received an x-ray today, you will receive an invoice from [facility]. Please contact [facility] at [phone] with questions or concerns regarding your invoice.   IF you received labwork today, you will receive an invoice from [facility]. Please contact [facility] at [phone] with questions  or concerns regarding your invoice.   Our billing staff will not be able to assist you with questions regarding bills from these companies.  You will be contacted with the lab results as  soon as they are available. The fastest way to get your results is to activate your My Chart account. Instructions are located on the last page of this paperwork. If you have not heard from [place] regarding the results in 2 weeks, please contact this office.       I personally performed the services described in this documentation, which was scribed in my presence. The recorded information has been reviewed and considered for accuracy and completeness, addended by me as needed, and agree with information above.  Signed,   Merri Ray, MD [facility] [place].  08/20/17 10:54 PM

## 2017-08-19 NOTE — Patient Instructions (Addendum)
No change in metformin dose for now. I would recommend starting Lipitor once per day for cholesterol with repeat testing in next approximately 10 weeks.   Let me know if you change your mind about the flu or pneumonia vaccines.    Thanks for coming in today. I completed a letter for your DOT examiner, but let me know if other information is needed.   Type 2 Diabetes Mellitus, Self Care, Adult When you have type 2 diabetes (type 2 diabetes mellitus), you must keep your blood sugar (glucose) under control. You can do this with:  Nutrition.  Exercise.  Lifestyle changes.  Medicines or insulin, if needed.  Support from your doctors and others.  How do I manage my blood sugar?  Check your blood sugar level every day, as often as told.  Call your doctor if your blood sugar is above your goal numbers for 2 tests in a row.  Have your A1c (hemoglobin A1c) level checked at least two times a year. Have it checked more often if your doctor tells you to. Your doctor will set treatment goals for you. Generally, you should have these blood sugar levels:  Before meals (preprandial): 80-130 mg/dL (4.4-7.2 mmol/L).  After meals (postprandial): lower than 180 mg/dL (10 mmol/L).  A1c level: less than 7%.  What do I need to know about high blood sugar? High blood sugar is called hyperglycemia. Know the signs of high blood sugar. Signs may include:  Feeling: ? Thirsty. ? Hungry. ? Very tired.  Needing to pee (urinate) more than usual.  Blurry vision.  What do I need to know about low blood sugar? Low blood sugar is called hypoglycemia. This is when blood sugar is at or below 70 mg/dL (3.9 mmol/L). Symptoms may include:  Feeling: ? Hungry. ? Worried or nervous (anxious). ? Sweaty and clammy. ? Confused. ? Dizzy. ? Sleepy. ? Sick to your stomach (nauseous).  Having: ? A fast heartbeat (palpitations). ? A headache. ? A change in your vision. ? Jerky movements that you cannot  control (seizure). ? Nightmares. ? Tingling or no feeling (numbness) around the mouth, lips, or tongue.  Having trouble with: ? Talking. ? Paying attention (concentrating). ? Moving (coordination). ? Sleeping.  Shaking.  Passing out (fainting).  Getting upset easily (irritability).  Treating low blood sugar  To treat low blood sugar, eat or drink something sugary right away. If you can think clearly and swallow safely, follow the 15:15 rule:  Take 15 grams of a fast-acting carb (carbohydrate). Some fast-acting carbs are: ? 1 tube of glucose gel. ? 3 sugar tablets (glucose pills). ? 6-8 pieces of hard candy. ? 4 oz (120 mL) of fruit juice. ? 4 oz (120 mL) regular (not diet) soda.  Check your blood sugar 15 minutes after you take the carb.  If your blood sugar is still at or below 70 mg/dL (3.9 mmol/L), take 15 grams of a carb again.  If your blood sugar does not go above 70 mg/dL (3.9 mmol/L) after 3 tries, get help right away.  After your blood sugar goes back to normal, eat a meal or a snack within 1 hour.  Treating very low blood sugar If your blood sugar is at or below 54 mg/dL (3 mmol/L), you have very low blood sugar (severe hypoglycemia). This is an emergency. Do not wait to see if the symptoms will go away. Get medical help right away. Call your local emergency services (911 in the [place] Do not drive  yourself to the hospital. If you have very low blood sugar and you cannot eat or drink, you may need a glucagon shot (injection). A family member or friend should learn how to check your blood sugar and how to give you a glucagon shot. Ask your doctor if you need to have a glucagon shot kit at home. What else is important to manage my diabetes? Medicine Follow these instructions about insulin and diabetes medicines:  Take them as told by your doctor.  Adjust them as told by your doctor.  Do not run out of them.  Having diabetes can raise your risk for other  long-term conditions. These include heart or kidney disease. Your doctor may prescribe medicines to help prevent problems from diabetes. Food   Make healthy food choices. These include: ? Chicken, fish, egg whites, and beans. ? Oats, whole wheat, bulgur, brown rice, quinoa, and millet. ? Fresh fruits and vegetables. ? Low-fat dairy products. ? Nuts, avocado, olive oil, and canola oil.  Make a food plan with a specialist (dietitian).  Follow instructions from your doctor about what you cannot eat or drink.  Drink enough fluid to keep your pee (urine) clear or pale yellow.  Eat healthy snacks between healthy meals.  Keep track of carbs that you eat. Read food labels. Learn food serving sizes.  Follow your sick day plan when you cannot eat or drink normally. Make this plan with your doctor so it is ready to use. Activity  Exercise at least 3 times a week.  Do not go more than 2 days without exercising.  Talk with your doctor before you start a new exercise. Your doctor may need to adjust your insulin, medicines, or food. Lifestyle   Do not use any tobacco products. These include cigarettes, chewing tobacco, and e-cigarettes.If you need help quitting, ask your doctor.  Ask your doctor how much alcohol is safe for you.  Learn to deal with stress. If you need help with this, ask your doctor. Body care  Stay up to date with your shots (immunizations).  Have your eyes and feet checked by a doctor as often as told.  Check your skin and feet every day. Check for cuts, bruises, redness, blisters, or sores.  Brush your teeth and gums two times a day.  Floss at least one time a day.  Go to the dentist least one time every 6 months.  Stay at a healthy weight. General instructions   Take over-the-counter and prescription medicines only as told by your doctor.  Share your diabetes care plan with: ? Your work or school. ? People you live with.  Check your pee (urine) for  ketones: ? When you are sick. ? As told by your doctor.  Carry a card or wear jewelry that says that you have diabetes.  Ask your doctor: ? Do I need to meet with a diabetes educator? ? Where can I find a support group for people with diabetes?  Keep all follow-up visits as told by your doctor. This is important. Where to find more information: To learn more about diabetes, visit:  American Diabetes Association: www.diabetes.org  American Association of Diabetes Educators: www.diabeteseducator.org/patient-resources  This information is not intended to replace advice given to you by your health care provider. Make sure you discuss any questions you have with your health care provider. Document Released: 10/13/2015 Document Revised: 11/27/2015 Document Reviewed: 07/25/2015 Elsevier Interactive Patient Education  2018 [employer].     IF you received an  x-ray today, you will receive an invoice from [facility]. Please contact [facility] at [phone] with questions or concerns regarding your invoice.   IF you received labwork today, you will receive an invoice from [facility]. Please contact [facility] at [phone] with questions or concerns regarding your invoice.   Our billing staff will not be able to assist you with questions regarding bills from these companies.  You will be contacted with the lab results as soon as they are available. The fastest way to get your results is to activate your My Chart account. Instructions are located on the last page of this paperwork. If you have not heard from [place] regarding the results in 2 weeks, please contact this office.

## 2017-08-20 ENCOUNTER — Encounter: Payer: Self-pay | Admitting: Family Medicine

## 2017-08-26 ENCOUNTER — Other Ambulatory Visit: Payer: Self-pay | Admitting: Family Medicine

## 2017-08-26 NOTE — Telephone Encounter (Signed)
Diabetic test supplies refilled per policy.

## 2017-10-28 ENCOUNTER — Ambulatory Visit: Payer: BLUE CROSS/BLUE SHIELD | Admitting: Family Medicine

## 2017-11-01 ENCOUNTER — Ambulatory Visit (INDEPENDENT_AMBULATORY_CARE_PROVIDER_SITE_OTHER): Payer: BLUE CROSS/BLUE SHIELD | Admitting: Family Medicine

## 2017-11-01 ENCOUNTER — Encounter: Payer: Self-pay | Admitting: Family Medicine

## 2017-11-01 VITALS — BP 151/91 | HR 66 | Temp 98.8°F | Resp 17 | Ht 75.0 in | Wt 246.0 lb

## 2017-11-01 DIAGNOSIS — E1165 Type 2 diabetes mellitus with hyperglycemia: Secondary | ICD-10-CM | POA: Diagnosis not present

## 2017-11-01 DIAGNOSIS — E119 Type 2 diabetes mellitus without complications: Secondary | ICD-10-CM

## 2017-11-01 DIAGNOSIS — Z23 Encounter for immunization: Secondary | ICD-10-CM

## 2017-11-01 DIAGNOSIS — E785 Hyperlipidemia, unspecified: Secondary | ICD-10-CM

## 2017-11-01 MED ORDER — METFORMIN HCL 500 MG PO TABS: 500.0000 mg | ORAL_TABLET | Freq: Two times a day (BID) | ORAL | 1 refills | Status: DC

## 2017-11-01 MED ORDER — ATORVASTATIN CALCIUM 10 MG PO TABS: 10.0000 mg | ORAL_TABLET | Freq: Every day | ORAL | 1 refills | Status: DC

## 2017-11-01 NOTE — Patient Instructions (Addendum)
 Type 2 Diabetes Mellitus, Self Care, Adult When you have type 2 diabetes (type 2 diabetes mellitus), you must keep your blood sugar (glucose) under control. You can do this with:  Nutrition.  Exercise.  Lifestyle changes.  Medicines or insulin, if needed.  Support from your doctors and others.  How do I manage my blood sugar?  Check your blood sugar level every day, as often as told.  Call your doctor if your blood sugar is above your goal numbers for 2 tests in a row.  Have your A1c (hemoglobin A1c) level checked at least two times a year. Have it checked more often if your doctor tells you to. Your doctor will set treatment goals for you. Generally, you should have these blood sugar levels:  Before meals (preprandial): 80-130 mg/dL (4.4-7.2 mmol/L).  After meals (postprandial): lower than 180 mg/dL (10 mmol/L).  A1c level: less than 7%.  What do I need to know about high blood sugar? High blood sugar is called hyperglycemia. Know the signs of high blood sugar. Signs may include:  Feeling: ? Thirsty. ? Hungry. ? Very tired.  Needing to pee (urinate) more than usual.  Blurry vision.  What do I need to know about low blood sugar? Low blood sugar is called hypoglycemia. This is when blood sugar is at or below 70 mg/dL (3.9 mmol/L). Symptoms may include:  Feeling: ? Hungry. ? Worried or nervous (anxious). ? Sweaty and clammy. ? Confused. ? Dizzy. ? Sleepy. ? Sick to your stomach (nauseous).  Having: ? A fast heartbeat (palpitations). ? A headache. ? A change in your vision. ? Jerky movements that you cannot control (seizure). ? Nightmares. ? Tingling or no feeling (numbness) around the mouth, lips, or tongue.  Having trouble with: ? Talking. ? Paying attention (concentrating). ? Moving (coordination). ? Sleeping.  Shaking.  Passing out (fainting).  Getting upset easily (irritability).  Treating low blood sugar  To treat low blood sugar, eat  or drink something sugary right away. If you can think clearly and swallow safely, follow the 15:15 rule:  Take 15 grams of a fast-acting carb (carbohydrate). Some fast-acting carbs are: ? 1 tube of glucose gel. ? 3 sugar tablets (glucose pills). ? 6-8 pieces of hard candy. ? 4 oz (120 mL) of fruit juice. ? 4 oz (120 mL) regular (not diet) soda.  Check your blood sugar 15 minutes after you take the carb.  If your blood sugar is still at or below 70 mg/dL (3.9 mmol/L), take 15 grams of a carb again.  If your blood sugar does not go above 70 mg/dL (3.9 mmol/L) after 3 tries, get help right away.  After your blood sugar goes back to normal, eat a meal or a snack within 1 hour.  Treating very low blood sugar If your blood sugar is at or below 54 mg/dL (3 mmol/L), you have very low blood sugar (severe hypoglycemia). This is an emergency. Do not wait to see if the symptoms will go away. Get medical help right away. Call your local emergency services (911 in the [place] Do not drive yourself to the hospital. If you have very low blood sugar and you cannot eat or drink, you may need a glucagon shot (injection). A family member or friend should learn how to check your blood sugar and how to give you a glucagon shot. Ask your doctor if you need to have a glucagon shot kit at home. What else is important to manage my diabetes?   Follow these instructions about insulin and diabetes medicines:  Take them as told by your doctor.  Adjust them as told by your doctor.  Do not run out of them.  Having diabetes can raise your risk for other long-term conditions. These include heart or kidney disease. Your doctor may prescribe medicines to help prevent problems from diabetes. Food   Make healthy food choices. These include: ? Chicken, fish, egg whites, and beans. ? Oats, whole wheat, bulgur, brown rice, quinoa, and millet. ? Fresh fruits and vegetables. ? Low-fat dairy products. ? Nuts,  avocado, olive oil, and canola oil.  Make a food plan with a specialist (dietitian).  Follow instructions from your doctor about what you cannot eat or drink.  Drink enough fluid to keep your pee (urine) clear or pale yellow.  Eat healthy snacks between healthy meals.  Keep track of carbs that you eat. Read food labels. Learn food serving sizes.  Follow your sick day plan when you cannot eat or drink normally. Make this plan with your doctor so it is ready to use. Activity  Exercise at least 3 times a week.  Do not go more than 2 days without exercising.  Talk with your doctor before you start a new exercise. Your doctor may need to adjust your insulin, medicines, or food. Lifestyle   Do not use any tobacco products. These include cigarettes, chewing tobacco, and e-cigarettes.If you need help quitting, ask your doctor.  Ask your doctor how much alcohol is safe for you.  Learn to deal with stress. If you need help with this, ask your doctor. Body care  Stay up to date with your shots (immunizations).  Have your eyes and feet checked by a doctor as often as told.  Check your skin and feet every day. Check for cuts, bruises, redness, blisters, or sores.  Brush your teeth and gums two times a day.  Floss at least one time a day.  Go to the dentist least one time every 6 months.  Stay at a healthy weight. General instructions   Take over-the-counter and prescription medicines only as told by your doctor.  Share your diabetes care plan with: ? Your work or school. ? People you live with.  Check your pee (urine) for ketones: ? When you are sick. ? As told by your doctor.  Carry a card or wear jewelry that says that you have diabetes.  Ask your doctor: ? Do I need to meet with a diabetes educator? ? Where can I find a support group for people with diabetes?  Keep all follow-up visits as told by your doctor. This is important. Where to find more information: To  learn more about diabetes, visit:  American Diabetes Association: www.diabetes.org  American Association of Diabetes Educators: www.diabeteseducator.org/patient-resources  This information is not intended to replace advice given to you by your health care provider. Make sure you discuss any questions you have with your health care provider. Document Released: 10/13/2015 Document Revised: 11/27/2015 Document Reviewed: 07/25/2015 Elsevier Interactive Patient Education  2018 [employer]     IF you received an x-ray today, you will receive an invoice from [facility]. Please contact [facility] at [phone] with questions or concerns regarding your invoice.   IF you received labwork today, you will receive an invoice from [facility]. Please contact [facility] at [phone] with questions or concerns regarding your invoice.   Our billing staff will not be able to assist you with questions regarding bills from these companies.    You will be contacted with the lab results as soon as they are available. The fastest way to get your results is to activate your My Chart account. Instructions are located on the last page of this paperwork. If you have not heard from us regarding the results in 2 weeks, please contact this office.      

## 2017-11-01 NOTE — Progress Notes (Signed)
Subjective:  By signing my name below, I, Moises Blood, attest that this documentation has been prepared under the direction and in the presence of Merri Ray, MD. Electronically Signed: Moises Blood, [place]. 11/01/2017 , 4:46 PM .  Patient was seen in Room 3 .   Patient ID: Tyrone Lee, male    DOB: April 22, 1971, 47 y.o.   MRN: [id number] Chief Complaint  Patient presents with  . Follow-up    diabetes, cholesterol    HPI Tyrone Lee is a 47 y.o. male Here for follow up of diabetes and hyperlipidemia. 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   Diabetes He was last seen on Feb 15th, with new diagnosis of diabetes after DOT physical in January. His initial A1C was 11.4. He was started on metformin 581m QD but in-office glucose only 121, and normal urine microalbumin. Home glucose readings around 100, up to high of 164 when he may have forgotten medications. Optho and dentist recommended at last visit. Letter completed for DOT, given stability of readings and tolerating medication.   Lab Results  Component Value Date   HGBA1C 11.4 08/04/2017    Patient reports his sugar readings lately have been around 130s and below. He's currently taking metformin 5051mBID without complications. He drinks alcohol about just once a month, only drinking socially with friends; denies drinking at home. He denies history of smoking. He denies chest pain, shortness of breath, lightheadedness, headache, blurry vision, abdominal pain, abdominal distention, or dark tarry stool.   Optho: hasn't seen yet Dentist: seen 3 times a year Pneumovax: will receive today.   Hyperlipidemia Lab Results  Component Value Date   CHOL 242 (H) 08/04/2017   HDL 36 (L) 08/04/2017   LDLCALC 178 (H) 08/04/2017   TRIG 142 08/04/2017   CHOLHDL 6.7 (H) 08/04/2017   Lab Results  Component Value Date   ALT 45 (H) 08/04/2017   AST 22 08/04/2017   ALKPHOS 85 08/04/2017   BILITOT 1.0 08/04/2017   He was  started on Lipitor 1058mD at last visit. He had borderline elevated LFT's, plan for recheck today. He denies any new muscle aches or cramps.   There are no active problems to display for this patient.  Past Medical History:  Diagnosis Date  . Acid reflux   . Hemorrhoids    No past surgical history on file. No Known Allergies Prior to Admission medications   Medication Sig Start Date End Date Taking? Authorizing Provider  ACCU-CHEK GUIDE test strip TEST UP TO FOUR TIMES A DAY AS DIRECTED (START WITH TESTING ONCE PER DAY) 08/26/17   GreWendie AgresteD  atorvastatin (LIPITOR) 10 MG tablet Take 1 tablet (10 mg total) by mouth daily at 6 PM. 08/19/17   GreWendie AgresteD  blood glucose meter kit and supplies Dispense based on patient and insurance preference. Use up to four times daily as directed. (FOR ICD-10 E10.9, E11.9). Once per day for now. 08/04/17   GreWendie AgresteD  metFORMIN (GLUCOPHAGE) 500 MG tablet Take 1 tablet (500 mg total) by mouth 2 (two) times daily with a meal. Start initially QD for 1 week. 08/04/17   GreWendie AgresteD  omeprazole (PRILOSEC) 10 MG capsule Take 10 mg by mouth daily.    [provider]   Social History   Socioeconomic History  . Marital status: Married    Spouse name: Not on file  . Number of children: 2  . Years  of education: Not on file  . Highest education level: Not on file  Occupational History  . Not on file  Social Needs  . Financial resource strain: Not on file  . Food insecurity:    Worry: Not on file    Inability: Not on file  . Transportation needs:    Medical: Not on file    Non-medical: Not on file  Tobacco Use  . Smoking status: Never Smoker  . Smokeless tobacco: Never Used  Substance and Sexual Activity  . Alcohol use: Yes    Comment: weekends, occasional  . Drug use: No  . Sexual activity: Not on file  Lifestyle  . Physical activity:    Days per week: Not on file    Minutes per session: Not on file  .  Stress: Not on file  Relationships  . Social connections:    Talks on phone: Not on file    Gets together: Not on file    Attends religious service: Not on file    Active member of club or organization: Not on file    Attends meetings of clubs or organizations: Not on file    Relationship status: Not on file  . Intimate partner violence:    Fear of current or ex partner: Not on file    Emotionally abused: Not on file    Physically abused: Not on file    Forced sexual activity: Not on file  Other Topics Concern  . Not on file  Social History Narrative  . Not on file   Review of Systems  Constitutional: Negative for fatigue and unexpected weight change.  Eyes: Negative for visual disturbance.  Respiratory: Negative for cough, chest tightness and shortness of breath.   Cardiovascular: Negative for chest pain, palpitations and leg swelling.  Gastrointestinal: Negative for abdominal distention, abdominal pain, blood in stool, diarrhea, nausea and vomiting.  Musculoskeletal: Negative for myalgias.  Neurological: Negative for dizziness, light-headedness and headaches.       Objective:   Physical Exam  Constitutional: He is oriented to person, place, and time. He appears well-developed and well-nourished. No distress.  HENT:  Head: Normocephalic and atraumatic.  Eyes: Pupils are equal, round, and reactive to light. EOM are normal.  Neck: Neck supple. No JVD present. Carotid bruit is not present.  Cardiovascular: Normal rate, regular rhythm and normal heart sounds.  No murmur heard. Pulmonary/Chest: Effort normal and breath sounds normal. No respiratory distress. He has no rales.  Musculoskeletal: Normal range of motion. He exhibits no edema.  Neurological: He is alert and oriented to person, place, and time.  Skin: Skin is warm and dry.  Psychiatric: He has a normal mood and affect. His behavior is normal.  Nursing note and vitals reviewed.   Vitals:   11/01/17 1607  BP: (!)  151/91  Pulse: 66  Resp: 17  Temp: 98.8 F (37.1 C)  TempSrc: Oral  SpO2: 98%  Weight: 246 lb (111.6 kg)  Height: 6' 3"  (1.905 m)       Assessment & Plan:    Tyrone Lee is a 47 y.o. male Type 2 diabetes mellitus without complication, without long-term current use of insulin ([place]) - Plan: Hemoglobin A1c Type 2 diabetes mellitus with hyperglycemia, without long-term current use of insulin ([place]) - Plan: metFORMIN (GLUCOPHAGE) 500 MG tablet  -Initial hyperglycemia, reports improved readings at home, tolerating metformin 500 mg twice daily.  Check A1c, then determine if med changes.  -Recommended ophthalmology/optometry eval  -Pneumovax given  -  Recheck 3 months  Hyperlipidemia, unspecified hyperlipidemia type - Plan: Comprehensive metabolic panel, Lipid panel, atorvastatin (LIPITOR) 10 MG tablet  -Tolerating Lipitor, continue same dose, check CMP, lipid panel.  Prior borderline LFTs, monitor for significant changes.  Need for prophylactic vaccination against Streptococcus pneumoniae (pneumococcus) - Plan: Pneumococcal polysaccharide vaccine 23-valent greater than or equal to 2yo subcutaneous/IM     Meds ordered this encounter  Medications  . metFORMIN (GLUCOPHAGE) 500 MG tablet    Sig: Take 1 tablet (500 mg total) by mouth 2 (two) times daily with a meal.    Dispense:  180 tablet    Refill:  1  . atorvastatin (LIPITOR) 10 MG tablet    Sig: Take 1 tablet (10 mg total) by mouth daily at 6 PM.    Dispense:  90 tablet    Refill:  1   Patient Instructions     Type 2 Diabetes Mellitus, Self Care, Adult When you have type 2 diabetes (type 2 diabetes mellitus), you must keep your blood sugar (glucose) under control. You can do this with:  Nutrition.  Exercise.  Lifestyle changes.  Medicines or insulin, if needed.  Support from your doctors and others.  How do I manage my blood sugar?  Check your blood sugar level every day, as often as told.  Call your doctor  if your blood sugar is above your goal numbers for 2 tests in a row.  Have your A1c (hemoglobin A1c) level checked at least two times a year. Have it checked more often if your doctor tells you to. Your doctor will set treatment goals for you. Generally, you should have these blood sugar levels:  Before meals (preprandial): 80-130 mg/dL (4.4-7.2 mmol/L).  After meals (postprandial): lower than 180 mg/dL (10 mmol/L).  A1c level: less than 7%.  What do I need to know about high blood sugar? High blood sugar is called hyperglycemia. Know the signs of high blood sugar. Signs may include:  Feeling: ? Thirsty. ? Hungry. ? Very tired.  Needing to pee (urinate) more than usual.  Blurry vision.  What do I need to know about low blood sugar? Low blood sugar is called hypoglycemia. This is when blood sugar is at or below 70 mg/dL (3.9 mmol/L). Symptoms may include:  Feeling: ? Hungry. ? Worried or nervous (anxious). ? Sweaty and clammy. ? Confused. ? Dizzy. ? Sleepy. ? Sick to your stomach (nauseous).  Having: ? A fast heartbeat (palpitations). ? A headache. ? A change in your vision. ? Jerky movements that you cannot control (seizure). ? Nightmares. ? Tingling or no feeling (numbness) around the mouth, lips, or tongue.  Having trouble with: ? Talking. ? Paying attention (concentrating). ? Moving (coordination). ? Sleeping.  Shaking.  Passing out (fainting).  Getting upset easily (irritability).  Treating low blood sugar  To treat low blood sugar, eat or drink something sugary right away. If you can think clearly and swallow safely, follow the 15:15 rule:  Take 15 grams of a fast-acting carb (carbohydrate). Some fast-acting carbs are: ? 1 tube of glucose gel. ? 3 sugar tablets (glucose pills). ? 6-8 pieces of hard candy. ? 4 oz (120 mL) of fruit juice. ? 4 oz (120 mL) regular (not diet) soda.  Check your blood sugar 15 minutes after you take the carb.  If your  blood sugar is still at or below 70 mg/dL (3.9 mmol/L), take 15 grams of a carb again.  If your blood sugar does not go above 70  mg/dL (3.9 mmol/L) after 3 tries, get help right away.  After your blood sugar goes back to normal, eat a meal or a snack within 1 hour.  Treating very low blood sugar If your blood sugar is at or below 54 mg/dL (3 mmol/L), you have very low blood sugar (severe hypoglycemia). This is an emergency. Do not wait to see if the symptoms will go away. Get medical help right away. Call your local emergency services (911 in the [place] Do not drive yourself to the hospital. If you have very low blood sugar and you cannot eat or drink, you may need a glucagon shot (injection). A family member or friend should learn how to check your blood sugar and how to give you a glucagon shot. Ask your doctor if you need to have a glucagon shot kit at home. What else is important to manage my diabetes? Medicine Follow these instructions about insulin and diabetes medicines:  Take them as told by your doctor.  Adjust them as told by your doctor.  Do not run out of them.  Having diabetes can raise your risk for other long-term conditions. These include heart or kidney disease. Your doctor may prescribe medicines to help prevent problems from diabetes. Food   Make healthy food choices. These include: ? Chicken, fish, egg whites, and beans. ? Oats, whole wheat, bulgur, brown rice, quinoa, and millet. ? Fresh fruits and vegetables. ? Low-fat dairy products. ? Nuts, avocado, olive oil, and canola oil.  Make a food plan with a specialist (dietitian).  Follow instructions from your doctor about what you cannot eat or drink.  Drink enough fluid to keep your pee (urine) clear or pale yellow.  Eat healthy snacks between healthy meals.  Keep track of carbs that you eat. Read food labels. Learn food serving sizes.  Follow your sick day plan when you cannot eat or drink normally. Make  this plan with your doctor so it is ready to use. Activity  Exercise at least 3 times a week.  Do not go more than 2 days without exercising.  Talk with your doctor before you start a new exercise. Your doctor may need to adjust your insulin, medicines, or food. Lifestyle   Do not use any tobacco products. These include cigarettes, chewing tobacco, and e-cigarettes.If you need help quitting, ask your doctor.  Ask your doctor how much alcohol is safe for you.  Learn to deal with stress. If you need help with this, ask your doctor. Body care  Stay up to date with your shots (immunizations).  Have your eyes and feet checked by a doctor as often as told.  Check your skin and feet every day. Check for cuts, bruises, redness, blisters, or sores.  Brush your teeth and gums two times a day.  Floss at least one time a day.  Go to the dentist least one time every 6 months.  Stay at a healthy weight. General instructions   Take over-the-counter and prescription medicines only as told by your doctor.  Share your diabetes care plan with: ? Your work or school. ? People you live with.  Check your pee (urine) for ketones: ? When you are sick. ? As told by your doctor.  Carry a card or wear jewelry that says that you have diabetes.  Ask your doctor: ? Do I need to meet with a diabetes educator? ? Where can I find a support group for people with diabetes?  Keep all follow-up visits as  told by your doctor. This is important. Where to find more information: To learn more about diabetes, visit:  American Diabetes Association: www.diabetes.org  American Association of Diabetes Educators: www.diabeteseducator.org/patient-resources  This information is not intended to replace advice given to you by your health care provider. Make sure you discuss any questions you have with your health care provider. Document Released: 10/13/2015 Document Revised: 11/27/2015 Document Reviewed:  07/25/2015 Elsevier Interactive Patient Education  2018 [employer].   IF you received an x-ray today, you will receive an invoice from [facility]. Please contact [facility] at [phone] with questions or concerns regarding your invoice.   IF you received labwork today, you will receive an invoice from [facility]. Please contact [facility] at [phone] with questions or concerns regarding your invoice.   Our billing staff will not be able to assist you with questions regarding bills from these companies.  You will be contacted with the lab results as soon as they are available. The fastest way to get your results is to activate your My Chart account. Instructions are located on the last page of this paperwork. If you have not heard from [place] regarding the results in 2 weeks, please contact this office.       I personally performed the services described in this documentation, which was scribed in my presence. The recorded information has been reviewed and considered for accuracy and completeness, addended by me as needed, and agree with information above.  Signed,   Merri Ray, MD [facility].  11/01/17 5:25 PM

## 2017-11-02 LAB — COMPREHENSIVE METABOLIC PANEL
ALBUMIN: 4.8 g/dL (ref 3.5–5.5)
ALK PHOS: 74 IU/L (ref 39–117)
ALT: 26 IU/L (ref 0–44)
AST: 14 IU/L (ref 0–40)
Albumin/Globulin Ratio: 2 (ref 1.2–2.2)
BUN / CREAT RATIO: 16 (ref 9–20)
BUN: 16 mg/dL (ref 6–24)
Bilirubin Total: 0.6 mg/dL (ref 0.0–1.2)
CHLORIDE: 103 mmol/L (ref 96–106)
CO2: 22 mmol/L (ref 20–29)
Calcium: 10.6 mg/dL — ABNORMAL HIGH (ref 8.7–10.2)
Creatinine, Ser: 0.99 mg/dL (ref 0.76–1.27)
GFR calc Af Amer: 104 mL/min/{1.73_m2} (ref >59)
GFR calc non Af Amer: 90 mL/min/{1.73_m2} (ref >59)
GLUCOSE: 98 mg/dL (ref 65–99)
Globulin, Total: 2.4 g/dL (ref 1.5–4.5)
Potassium: 4.2 mmol/L (ref 3.5–5.2)
Sodium: 141 mmol/L (ref 134–144)
Total Protein: 7.2 g/dL (ref 6.0–8.5)

## 2017-11-02 LAB — HEMOGLOBIN A1C
ESTIMATED AVERAGE GLUCOSE: 151 mg/dL
Hgb A1c MFr Bld: 6.9 % — ABNORMAL HIGH (ref 4.8–5.6)

## 2017-11-02 LAB — LIPID PANEL
CHOLESTEROL TOTAL: 172 mg/dL (ref 100–199)
Chol/HDL Ratio: 3.9 ratio (ref 0.0–5.0)
HDL: 44 mg/dL (ref >39)
LDL CALC: 100 mg/dL — AB (ref 0–99)
Triglycerides: 138 mg/dL (ref 0–149)
VLDL CHOLESTEROL CAL: 28 mg/dL (ref 5–40)

## 2017-12-12 ENCOUNTER — Other Ambulatory Visit: Payer: Self-pay | Admitting: Family Medicine

## 2018-02-02 ENCOUNTER — Other Ambulatory Visit: Payer: Self-pay

## 2018-02-02 ENCOUNTER — Ambulatory Visit (INDEPENDENT_AMBULATORY_CARE_PROVIDER_SITE_OTHER): Payer: BLUE CROSS/BLUE SHIELD | Admitting: Family Medicine

## 2018-02-02 ENCOUNTER — Encounter: Payer: Self-pay | Admitting: Family Medicine

## 2018-02-02 VITALS — BP 148/88 | HR 60 | Temp 98.1°F | Ht 76.0 in | Wt 248.4 lb

## 2018-02-02 DIAGNOSIS — E785 Hyperlipidemia, unspecified: Secondary | ICD-10-CM

## 2018-02-02 DIAGNOSIS — I10 Essential (primary) hypertension: Secondary | ICD-10-CM | POA: Diagnosis not present

## 2018-02-02 DIAGNOSIS — E1165 Type 2 diabetes mellitus with hyperglycemia: Secondary | ICD-10-CM

## 2018-02-02 MED ORDER — LISINOPRIL 5 MG PO TABS: 5.0000 mg | ORAL_TABLET | Freq: Every day | ORAL | 1 refills | Status: DC

## 2018-02-02 MED ORDER — METFORMIN HCL 500 MG PO TABS: 500.0000 mg | ORAL_TABLET | Freq: Two times a day (BID) | ORAL | 1 refills | Status: DC

## 2018-02-02 MED ORDER — ATORVASTATIN CALCIUM 10 MG PO TABS: 10.0000 mg | ORAL_TABLET | Freq: Every day | ORAL | 1 refills | Status: DC

## 2018-02-02 NOTE — Patient Instructions (Addendum)
Start low dose lisinopril once per day for blood pressure. No other changes in meds today.  Recheck in 3 months.   Thanks for coming in today.    Hypertension Hypertension, commonly called high blood pressure, is when the force of blood pumping through the arteries is too strong. The arteries are the blood vessels that carry blood from the heart throughout the body. Hypertension forces the heart to work harder to pump blood and may cause arteries to become narrow or stiff. Having untreated or uncontrolled hypertension can cause heart attacks, strokes, kidney disease, and other problems. A blood pressure reading consists of a higher number over a lower number. Ideally, your blood pressure should be below 120/80. The first ("top") number is called the systolic pressure. It is a measure of the pressure in your arteries as your heart beats. The second ("bottom") number is called the diastolic pressure. It is a measure of the pressure in your arteries as the heart relaxes. What are the causes? The cause of this condition is not known. What increases the risk? Some risk factors for high blood pressure are under your control. Others are not. Factors you can change  Smoking.  Having type 2 diabetes mellitus, high cholesterol, or both.  Not getting enough exercise or physical activity.  Being overweight.  Having too much fat, sugar, calories, or salt (sodium) in your diet.  Drinking too much alcohol. Factors that are difficult or impossible to change  Having chronic kidney disease.  Having a family history of high blood pressure.  Age. Risk increases with age.  Race. You may be at higher risk if you are African-American.  Gender. Men are at higher risk than women before age 75. After age 50, women are at higher risk than men.  Having obstructive sleep apnea.  Stress. What are the signs or symptoms? Extremely high blood pressure (hypertensive crisis) may  cause:  Headache.  Anxiety.  Shortness of breath.  Nosebleed.  Nausea and vomiting.  Severe chest pain.  Jerky movements you cannot control (seizures).  How is this diagnosed? This condition is diagnosed by measuring your blood pressure while you are seated, with your arm resting on a surface. The cuff of the blood pressure monitor will be placed directly against the skin of your upper arm at the level of your heart. It should be measured at least twice using the same arm. Certain conditions can cause a difference in blood pressure between your right and left arms. Certain factors can cause blood pressure readings to be lower or higher than normal (elevated) for a short period of time:  When your blood pressure is higher when you are in a health care provider's office than when you are at home, this is called white coat hypertension. Most people with this condition do not need medicines.  When your blood pressure is higher at home than when you are in a health care provider's office, this is called masked hypertension. Most people with this condition may need medicines to control blood pressure.  If you have a high blood pressure reading during one visit or you have normal blood pressure with other risk factors:  You may be asked to return on a different day to have your blood pressure checked again.  You may be asked to monitor your blood pressure at home for 1 week or longer.  If you are diagnosed with hypertension, you may have other blood or imaging tests to help your health care provider understand your  overall risk for other conditions. How is this treated? This condition is treated by making healthy lifestyle changes, such as eating healthy foods, exercising more, and reducing your alcohol intake. Your health care provider may prescribe medicine if lifestyle changes are not enough to get your blood pressure under control, and if:  Your systolic blood pressure is above  130.  Your diastolic blood pressure is above 80.  Your personal target blood pressure may vary depending on your medical conditions, your age, and other factors. Follow these instructions at home: Eating and drinking  Eat a diet that is high in fiber and potassium, and low in sodium, added sugar, and fat. An example eating plan is called the DASH (Dietary Approaches to Stop Hypertension) diet. To eat this way: ? Eat plenty of fresh fruits and vegetables. Try to fill half of your plate at each meal with fruits and vegetables. ? Eat whole grains, such as whole wheat pasta, brown rice, or whole grain bread. Fill about one quarter of your plate with whole grains. ? Eat or drink low-fat dairy products, such as skim milk or low-fat yogurt. ? Avoid fatty cuts of meat, processed or cured meats, and poultry with skin. Fill about one quarter of your plate with lean proteins, such as fish, chicken without skin, beans, eggs, and tofu. ? Avoid premade and processed foods. These tend to be higher in sodium, added sugar, and fat.  Reduce your daily sodium intake. Most people with hypertension should eat less than 1,500 mg of sodium a day.  Limit alcohol intake to no more than 1 drink a day for nonpregnant women and 2 drinks a day for men. One drink equals 12 oz of beer, 5 oz of wine, or 1 oz of hard liquor. Lifestyle  Work with your health care provider to maintain a healthy body weight or to lose weight. Ask what an ideal weight is for you.  Get at least 30 minutes of exercise that causes your heart to beat faster (aerobic exercise) most days of the week. Activities may include walking, swimming, or biking.  Include exercise to strengthen your muscles (resistance exercise), such as pilates or lifting weights, as part of your weekly exercise routine. Try to do these types of exercises for 30 minutes at least 3 days a week.  Do not use any products that contain nicotine or tobacco, such as cigarettes and  e-cigarettes. If you need help quitting, ask your health care provider.  Monitor your blood pressure at home as told by your health care provider.  Keep all follow-up visits as told by your health care provider. This is important. Medicines  Take over-the-counter and prescription medicines only as told by your health care provider. Follow directions carefully. Blood pressure medicines must be taken as prescribed.  Do not skip doses of blood pressure medicine. Doing this puts you at risk for problems and can make the medicine less effective.  Ask your health care provider about side effects or reactions to medicines that you should watch for. Contact a health care provider if:  You think you are having a reaction to a medicine you are taking.  You have headaches that keep coming back (recurring).  You feel dizzy.  You have swelling in your ankles.  You have trouble with your vision. Get help right away if:  You develop a severe headache or confusion.  You have unusual weakness or numbness.  You feel faint.  You have severe pain in your chest or  abdomen.  You vomit repeatedly.  You have trouble breathing. Summary  Hypertension is when the force of blood pumping through your arteries is too strong. If this condition is not controlled, it may put you at risk for serious complications.  Your personal target blood pressure may vary depending on your medical conditions, your age, and other factors. For most people, a normal blood pressure is less than 120/80.  Hypertension is treated with lifestyle changes, medicines, or a combination of both. Lifestyle changes include weight loss, eating a healthy, low-sodium diet, exercising more, and limiting alcohol. This information is not intended to replace advice given to you by your health care provider. Make sure you discuss any questions you have with your health care provider. Document Released: 06/21/2005 Document Revised: 05/19/2016  Document Reviewed: 05/19/2016 Elsevier Interactive Patient Education  2018 [employer].   IF you received an x-ray today, you will receive an invoice from [facility]. Please contact [facility] at [phone] with questions or concerns regarding your invoice.   IF you received labwork today, you will receive an invoice from [place]. Please contact [facility] at [phone] with questions or concerns regarding your invoice.   Our billing staff will not be able to assist you with questions regarding bills from these companies.  You will be contacted with the lab results as soon as they are available. The fastest way to get your results is to activate your My Chart account. Instructions are located on the last page of this paperwork. If you have not heard from [place] regarding the results in 2 weeks, please contact this office.

## 2018-02-02 NOTE — Progress Notes (Signed)
Subjective:    Patient ID: Tyrone Lee, male    DOB: 30-Jul-1970, 47 y.o.   MRN: [id number]  HPI Tyrone Lee is a 47 y.o. male Presents today for: Chief Complaint  Patient presents with  . Diabetes    3 m f/u   . Hypertension    (fasting today for bloodwork)   DM: Lab Results  Component Value Date   HGBA1C 6.9 (H) 11/01/2017  A1c 6 months ago 11.4 Still taking metformin 520m BID. No GI intolerance, no missed doses.  No symptomatic lows.  Has not yet seen optho. Plans on scheduling.  Urine microalbumin 08/04/17 - normal  Foot exam 08/19/17 Pneumovax: 11/01/17.  Exercise daily.walking. Occasional basketball.  Wt Readings from Last 3 Encounters:  02/02/18 248 lb 6.4 oz (112.7 kg)  11/01/17 246 lb (111.6 kg)  08/19/17 244 lb 6.4 oz (110.9 kg)    Hyperlipidemia:  Lab Results  Component Value Date   CHOL 172 11/01/2017   HDL 44 11/01/2017   LDLCALC 100 (H) 11/01/2017   TRIG 138 11/01/2017   CHOLHDL 3.9 11/01/2017   Lab Results  Component Value Date   ALT 26 11/01/2017   AST 14 11/01/2017   ALKPHOS 74 11/01/2017   BILITOT 0.6 11/01/2017  on lipitor 117mqd. No new myalgias or other side effects. Fasting today.   Hypertension: BP Readings from Last 3 Encounters:  02/02/18 (!) 148/88  11/01/17 (!) 151/91  08/19/17 138/84   Lab Results  Component Value Date   CREATININE 0.99 11/01/2017  no current meds.     There are no active problems to display for this patient.  Past Medical History:  Diagnosis Date  . Acid reflux   . Hemorrhoids    No past surgical history on file. No Known Allergies Prior to Admission medications   Medication Sig Start Date End Date Taking? Authorizing Provider  ACCU-CHEK GUIDE test strip TEST UP TO FOUR TIMES A DAY AS DIRECTED (START WITH TESTING ONCE PER DAY) 12/12/17  Yes GrWendie AgresteMD  atorvastatin (LIPITOR) 10 MG tablet Take 1 tablet (10 mg total) by mouth daily at 6 PM. 11/01/17  Yes GrWendie AgresteMD    blood glucose meter kit and supplies Dispense based on patient and insurance preference. Use up to four times daily as directed. (FOR ICD-10 E10.9, E11.9). Once per day for now. 08/04/17  Yes GrWendie AgresteMD  metFORMIN (GLUCOPHAGE) 500 MG tablet Take 1 tablet (500 mg total) by mouth 2 (two) times daily with a meal. 11/01/17  Yes GrWendie AgresteMD  omeprazole (PRILOSEC) 10 MG capsule Take 10 mg by mouth daily.   Yes [provider]   Social History   Socioeconomic History  . Marital status: Married    Spouse name: Not on file  . Number of children: 2  . Years of education: Not on file  . Highest education level: Not on file  Occupational History  . Not on file  Social Needs  . Financial resource strain: Not on file  . Food insecurity:    Worry: Not on file    Inability: Not on file  . Transportation needs:    Medical: Not on file    Non-medical: Not on file  Tobacco Use  . Smoking status: Never Smoker  . Smokeless tobacco: Never Used  Substance and Sexual Activity  . Alcohol use: Yes    Comment: weekends, occasional  . Drug use: No  . Sexual activity:  Not on file  Lifestyle  . Physical activity:    Days per week: Not on file    Minutes per session: Not on file  . Stress: Not on file  Relationships  . Social connections:    Talks on phone: Not on file    Gets together: Not on file    Attends religious service: Not on file    Active member of club or organization: Not on file    Attends meetings of clubs or organizations: Not on file    Relationship status: Not on file  . Intimate partner violence:    Fear of current or ex partner: Not on file    Emotionally abused: Not on file    Physically abused: Not on file    Forced sexual activity: Not on file  Other Topics Concern  . Not on file  Social History Narrative  . Not on file    Review of Systems  Constitutional: Negative for fatigue and unexpected weight change.  Eyes: Negative for visual  disturbance.  Respiratory: Negative for cough, chest tightness and shortness of breath.   Cardiovascular: Negative for chest pain, palpitations and leg swelling.  Gastrointestinal: Negative for abdominal pain and blood in stool.  Neurological: Negative for dizziness, light-headedness and headaches.       Objective:   Physical Exam  Constitutional: He is oriented to person, place, and time. He appears well-developed and well-nourished.  HENT:  Head: Normocephalic and atraumatic.  Eyes: Pupils are equal, round, and reactive to light. EOM are normal.  Neck: No JVD present. Carotid bruit is not present.  Cardiovascular: Normal rate, regular rhythm and normal heart sounds.  No murmur heard. Pulmonary/Chest: Effort normal and breath sounds normal. He has no rales.  Musculoskeletal: He exhibits no edema.  Neurological: He is alert and oriented to person, place, and time.  Skin: Skin is warm and dry.  Psychiatric: He has a normal mood and affect.  Vitals reviewed.   Vitals:   02/02/18 1610 02/02/18 1613  BP: (!) 158/92 (!) 148/88  Pulse: 60   Temp: 98.1 F (36.7 C)   TempSrc: Oral   SpO2: 98%   Weight: 248 lb 6.4 oz (112.7 kg)   Height: _0  (1.93 m)         Assessment & Plan:    Tyrone Lee is a 47 y.o. male Essential hypertension  - elevated, and with history of diabetes will start lisinopril 5 mg daily, check labs,  in 3 months.  Recheck in 3 months.  Type 2 diabetes mellitus with hyperglycemia, without long-term current use of insulin (HCC) - Plan: Hemoglobin A1c, metFORMIN (GLUCOPHAGE) 500 MG tablet  -Tolerating current regimen, check A1c, no other changes.  Recheck 3 months  Hyperlipidemia, unspecified hyperlipidemia type - Plan: Lipid panel, Comprehensive metabolic panel, atorvastatin (LIPITOR) 10 MG tablet  -Tolerating Lipitor, no changes, labs pending.  Meds ordered this encounter  Medications  . lisinopril (PRINIVIL,ZESTRIL) 5 MG tablet    Sig: Take 1  tablet (5 mg total) by mouth daily.    Dispense:  90 tablet    Refill:  1  . metFORMIN (GLUCOPHAGE) 500 MG tablet    Sig: Take 1 tablet (500 mg total) by mouth 2 (two) times daily with a meal.    Dispense:  180 tablet    Refill:  1  . atorvastatin (LIPITOR) 10 MG tablet    Sig: Take 1 tablet (10 mg total) by mouth daily at 6 PM.  Dispense:  90 tablet    Refill:  1   Patient Instructions   Start low dose lisinopril once per day for blood pressure. No other changes in meds today.  Recheck in 3 months.   Thanks for coming in today.    Hypertension Hypertension, commonly called high blood pressure, is when the force of blood pumping through the arteries is too strong. The arteries are the blood vessels that carry blood from the heart throughout the body. Hypertension forces the heart to work harder to pump blood and may cause arteries to become narrow or stiff. Having untreated or uncontrolled hypertension can cause heart attacks, strokes, kidney disease, and other problems. A blood pressure reading consists of a higher number over a lower number. Ideally, your blood pressure should be below 120/80. The first ("top") number is called the systolic pressure. It is a measure of the pressure in your arteries as your heart beats. The second ("bottom") number is called the diastolic pressure. It is a measure of the pressure in your arteries as the heart relaxes. What are the causes? The cause of this condition is not known. What increases the risk? Some risk factors for high blood pressure are under your control. Others are not. Factors you can change  Smoking.  Having type 2 diabetes mellitus, high cholesterol, or both.  Not getting enough exercise or physical activity.  Being overweight.  Having too much fat, sugar, calories, or salt (sodium) in your diet.  Drinking too much alcohol. Factors that are difficult or impossible to change  Having chronic kidney disease.  Having a  family history of high blood pressure.  Age. Risk increases with age.  Race. You may be at higher risk if you are African-American.  Gender. Men are at higher risk than women before age 76. After age 76, women are at higher risk than men.  Having obstructive sleep apnea.  Stress. What are the signs or symptoms? Extremely high blood pressure (hypertensive crisis) may cause:  Headache.  Anxiety.  Shortness of breath.  Nosebleed.  Nausea and vomiting.  Severe chest pain.  Jerky movements you cannot control (seizures).  How is this diagnosed? This condition is diagnosed by measuring your blood pressure while you are seated, with your arm resting on a surface. The cuff of the blood pressure monitor will be placed directly against the skin of your upper arm at the level of your heart. It should be measured at least twice using the same arm. Certain conditions can cause a difference in blood pressure between your right and left arms. Certain factors can cause blood pressure readings to be lower or higher than normal (elevated) for a short period of time:  When your blood pressure is higher when you are in a health care provider's office than when you are at home, this is called white coat hypertension. Most people with this condition do not need medicines.  When your blood pressure is higher at home than when you are in a health care provider's office, this is called masked hypertension. Most people with this condition may need medicines to control blood pressure.  If you have a high blood pressure reading during one visit or you have normal blood pressure with other risk factors:  You may be asked to return on a different day to have your blood pressure checked again.  You may be asked to monitor your blood pressure at home for 1 week or longer.  If you are diagnosed with hypertension,  you may have other blood or imaging tests to help your health care provider understand your  overall risk for other conditions. How is this treated? This condition is treated by making healthy lifestyle changes, such as eating healthy foods, exercising more, and reducing your alcohol intake. Your health care provider may prescribe medicine if lifestyle changes are not enough to get your blood pressure under control, and if:  Your systolic blood pressure is above 130.  Your diastolic blood pressure is above 80.  Your personal target blood pressure may vary depending on your medical conditions, your age, and other factors. Follow these instructions at home: Eating and drinking  Eat a diet that is high in fiber and potassium, and low in sodium, added sugar, and fat. An example eating plan is called the DASH (Dietary Approaches to Stop Hypertension) diet. To eat this way: ? Eat plenty of fresh fruits and vegetables. Try to fill half of your plate at each meal with fruits and vegetables. ? Eat whole grains, such as whole wheat pasta, brown rice, or whole grain bread. Fill about one quarter of your plate with whole grains. ? Eat or drink low-fat dairy products, such as skim milk or low-fat yogurt. ? Avoid fatty cuts of meat, processed or cured meats, and poultry with skin. Fill about one quarter of your plate with lean proteins, such as fish, chicken without skin, beans, eggs, and tofu. ? Avoid premade and processed foods. These tend to be higher in sodium, added sugar, and fat.  Reduce your daily sodium intake. Most people with hypertension should eat less than 1,500 mg of sodium a day.  Limit alcohol intake to no more than 1 drink a day for nonpregnant women and 2 drinks a day for men. One drink equals 12 oz of beer, 5 oz of wine, or 1 oz of hard liquor. Lifestyle  Work with your health care provider to maintain a healthy body weight or to lose weight. Ask what an ideal weight is for you.  Get at least 30 minutes of exercise that causes your heart to beat faster (aerobic exercise)  most days of the week. Activities may include walking, swimming, or biking.  Include exercise to strengthen your muscles (resistance exercise), such as pilates or lifting weights, as part of your weekly exercise routine. Try to do these types of exercises for 30 minutes at least 3 days a week.  Do not use any products that contain nicotine or tobacco, such as cigarettes and e-cigarettes. If you need help quitting, ask your health care provider.  Monitor your blood pressure at home as told by your health care provider.  Keep all follow-up visits as told by your health care provider. This is important. Medicines  Take over-the-counter and prescription medicines only as told by your health care provider. Follow directions carefully. Blood pressure medicines must be taken as prescribed.  Do not skip doses of blood pressure medicine. Doing this puts you at risk for problems and can make the medicine less effective.  Ask your health care provider about side effects or reactions to medicines that you should watch for. Contact a health care provider if:  You think you are having a reaction to a medicine you are taking.  You have headaches that keep coming back (recurring).  You feel dizzy.  You have swelling in your ankles.  You have trouble with your vision. Get help right away if:  You develop a severe headache or confusion.  You have unusual  weakness or numbness.  You feel faint.  You have severe pain in your chest or abdomen.  You vomit repeatedly.  You have trouble breathing. Summary  Hypertension is when the force of blood pumping through your arteries is too strong. If this condition is not controlled, it may put you at risk for serious complications.  Your personal target blood pressure may vary depending on your medical conditions, your age, and other factors. For most people, a normal blood pressure is less than 120/80.  Hypertension is treated with lifestyle changes,  medicines, or a combination of both. Lifestyle changes include weight loss, eating a healthy, low-sodium diet, exercising more, and limiting alcohol. This information is not intended to replace advice given to you by your health care provider. Make sure you discuss any questions you have with your health care provider. Document Released: 06/21/2005 Document Revised: 05/19/2016 Document Reviewed: 05/19/2016 Elsevier Interactive Patient Education  2018 [employer].   IF you received an x-ray today, you will receive an invoice from [facility]. Please contact [facility] at [phone] with questions or concerns regarding your invoice.   IF you received labwork today, you will receive an invoice from [place]. Please contact [facility] at [phone] with questions or concerns regarding your invoice.   Our billing staff will not be able to assist you with questions regarding bills from these companies.  You will be contacted with the lab results as soon as they are available. The fastest way to get your results is to activate your My Chart account. Instructions are located on the last page of this paperwork. If you have not heard from [place] regarding the results in 2 weeks, please contact this office.       Signed,   Merri Ray, MD [facility].  02/05/18 1:08 PM

## 2018-02-03 LAB — COMPREHENSIVE METABOLIC PANEL
A/G RATIO: 1.8 (ref 1.2–2.2)
ALT: 32 IU/L (ref 0–44)
AST: 16 IU/L (ref 0–40)
Albumin: 4.8 g/dL (ref 3.5–5.5)
Alkaline Phosphatase: 79 IU/L (ref 39–117)
BUN/Creatinine Ratio: 13 (ref 9–20)
BUN: 12 mg/dL (ref 6–24)
Bilirubin Total: 1.2 mg/dL (ref 0.0–1.2)
CALCIUM: 10.8 mg/dL — AB (ref 8.7–10.2)
CO2: 22 mmol/L (ref 20–29)
Chloride: 101 mmol/L (ref 96–106)
Creatinine, Ser: 0.95 mg/dL (ref 0.76–1.27)
GFR, EST AFRICAN AMERICAN: 108 mL/min/{1.73_m2} (ref >59)
GFR, EST NON AFRICAN AMERICAN: 94 mL/min/{1.73_m2} (ref >59)
GLOBULIN, TOTAL: 2.6 g/dL (ref 1.5–4.5)
Glucose: 102 mg/dL — ABNORMAL HIGH (ref 65–99)
POTASSIUM: 4.1 mmol/L (ref 3.5–5.2)
SODIUM: 142 mmol/L (ref 134–144)
TOTAL PROTEIN: 7.4 g/dL (ref 6.0–8.5)

## 2018-02-03 LAB — LIPID PANEL
CHOLESTEROL TOTAL: 197 mg/dL (ref 100–199)
Chol/HDL Ratio: 4.6 ratio (ref 0.0–5.0)
HDL: 43 mg/dL (ref >39)
LDL Calculated: 124 mg/dL — ABNORMAL HIGH (ref 0–99)
TRIGLYCERIDES: 149 mg/dL (ref 0–149)
VLDL Cholesterol Cal: 30 mg/dL (ref 5–40)

## 2018-02-03 LAB — HEMOGLOBIN A1C
Est. average glucose Bld gHb Est-mCnc: 160 mg/dL
Hgb A1c MFr Bld: 7.2 % — ABNORMAL HIGH (ref 4.8–5.6)

## 2018-02-10 ENCOUNTER — Telehealth: Payer: Self-pay | Admitting: Family Medicine

## 2018-02-10 NOTE — Telephone Encounter (Signed)
Left a voicemail for Tyrone Lee in regards to his appt he has with Dr. Carlota Raspberry on 05-11-2018. The provider will not be in the office on that day and would need to be rescheduled.

## 2018-04-21 ENCOUNTER — Other Ambulatory Visit: Payer: Self-pay | Admitting: Family Medicine

## 2018-04-21 NOTE — Telephone Encounter (Signed)
Requested Prescriptions  Pending Prescriptions Disp Refills  . ACCU-CHEK GUIDE test strip [Pharmacy Med Name: ACCU-CHEK GUIDE TEST STRIP] 400 each 1    Sig: TEST UP TO FOUR TIMES A DAY AS DIRECTED (START WITH TESTING ONCE PER DAY)     Endocrinology: Diabetes - Testing Supplies Passed - 04/21/2018  1:46 AM      Passed - Valid encounter within last 12 months    Recent Outpatient Visits          2 months ago Essential hypertension   [facility], Ranell Patrick, MD   5 months ago Type 2 diabetes mellitus without complication, without long-term current use of insulin [facility])   [facility], Ranell Patrick, MD   8 months ago Hyperlipidemia, unspecified hyperlipidemia type   [facility], Ranell Patrick, MD   8 months ago Type 2 diabetes mellitus with hyperglycemia, without long-term current use of insulin [facility])   [facility], Ranell Patrick, MD   4 years ago Driver's permit physical examination   [facility], Fenton Malling, MD      Future Appointments            In 2 weeks Carlota Raspberry Ranell Patrick, MD [facility] [place], [facility]   In 3 weeks Wendie Agreste, MD [facility] at [place], [facility]

## 2018-05-05 ENCOUNTER — Other Ambulatory Visit: Payer: Self-pay

## 2018-05-05 ENCOUNTER — Encounter: Payer: Self-pay | Admitting: Family Medicine

## 2018-05-05 ENCOUNTER — Ambulatory Visit (INDEPENDENT_AMBULATORY_CARE_PROVIDER_SITE_OTHER): Payer: BLUE CROSS/BLUE SHIELD | Admitting: Family Medicine

## 2018-05-05 VITALS — BP 130/90 | HR 65 | Temp 98.1°F | Ht 76.0 in | Wt 254.0 lb

## 2018-05-05 DIAGNOSIS — R221 Localized swelling, mass and lump, neck: Secondary | ICD-10-CM

## 2018-05-05 DIAGNOSIS — E119 Type 2 diabetes mellitus without complications: Secondary | ICD-10-CM

## 2018-05-05 DIAGNOSIS — E785 Hyperlipidemia, unspecified: Secondary | ICD-10-CM

## 2018-05-05 MED ORDER — ATORVASTATIN CALCIUM 20 MG PO TABS: 20.0000 mg | ORAL_TABLET | Freq: Every day | ORAL | 1 refills | Status: DC

## 2018-05-05 NOTE — Patient Instructions (Addendum)
Increase lipitor to 2 per day.  If A1c is still elevated, would recommend changing dose of metformin. I will schedule ultrasound as well as look at some hormone tests for the fullness in the right side of your neck.  Further work-up to be determined by those results. Let me know if there are questions.    If you have lab work done today you will be contacted with your lab results within the next 2 weeks.  If you have not heard from [place] then please contact us. The fastest way to get your results is to register for My Chart.   IF you received an x-ray today, you will receive an invoice from [facility]. Please contact [facility] at [phone] with questions or concerns regarding your invoice.   IF you received labwork today, you will receive an invoice from [place]. Please contact [facility] at [phone] with questions or concerns regarding your invoice.   Our billing staff will not be able to assist you with questions regarding bills from these companies.  You will be contacted with the lab results as soon as they are available. The fastest way to get your results is to activate your My Chart account. Instructions are located on the last page of this paperwork. If you have not heard from [place] regarding the results in 2 weeks, please contact this office.

## 2018-05-05 NOTE — Progress Notes (Signed)
Subjective:  By signing my name below, I, Moises Blood, attest that this documentation has been prepared under the direction and in the presence of Merri Ray, MD. Electronically Signed: Moises Blood, [place]. 05/05/2018 , 4:30 PM .  Patient was seen in Room 8 .   Patient ID: Tyrone Lee, male    DOB: [birth year], 47 y.o.   MRN: [id number] Chief Complaint  Patient presents with  . chronic condition    2 m f/u (BP)   HPI AVEDIS Lee is a 47 y.o. male Here for follow up.   Diabetes He takes metformin 500 mg bid. He is also on a statin and ACE inhibitor.   Lab Results  Component Value Date   HGBA1C 7.2 (H) 02/02/2018   Foot exam: 08/19/17 Pneumovax: 11/01/17  Optho: ordered in Feb; hasn't seen eye doctor recently.   Hyperlipidemia Lab Results  Component Value Date   CHOL 197 02/02/2018   HDL 43 02/02/2018   LDLCALC 124 (H) 02/02/2018   TRIG 149 02/02/2018   CHOLHDL 4.6 02/02/2018   Lab Results  Component Value Date   ALT 32 02/02/2018   AST 16 02/02/2018   ALKPHOS 79 02/02/2018   BILITOT 1.2 02/02/2018   He takes Lipitor 10 mg qd; recommended 20 mg qd in Aug. He denies any side effects with Lipitor.   Hypercalcemia Lab Results  Component Value Date   CALCIUM 10.8 (H) 02/02/2018   Calcium was mildly elevated at 10.6 in April. He denies any known family history of thyroid problems. He denies any unexplained weight loss.   HTN BP Readings from Last 3 Encounters:  05/05/18 130/90  02/02/18 (!) 148/88  11/01/17 (!) 151/91   Lab Results  Component Value Date   CREATININE 0.95 02/02/2018   He takes Lisinopril 5 mg qd. He drinks about 8-11 alcoholic drinks, once to twice a month; usually when hanging out with friends. His last drink was about 3 weeks ago. He denies checking his BP outside of the office. He walks for exercise, as often as he can; he and his girlfriend started [employer] recently. He denies chest pain, shortness of breath,  lightheadedness, headache, dizziness or blood in stool.   There are no active problems to display for this patient.  Past Medical History:  Diagnosis Date  . Acid reflux   . Hemorrhoids    No past surgical history on file. No Known Allergies Prior to Admission medications   Medication Sig Start Date End Date Taking? Authorizing Provider  ACCU-CHEK GUIDE test strip TEST UP TO FOUR TIMES A DAY AS DIRECTED (START WITH TESTING ONCE PER DAY) 04/21/18   Wendie Agreste, MD  atorvastatin (LIPITOR) 10 MG tablet Take 1 tablet (10 mg total) by mouth daily at 6 PM. 02/02/18   Wendie Agreste, MD  blood glucose meter kit and supplies Dispense based on patient and insurance preference. Use up to four times daily as directed. (FOR ICD-10 E10.9, E11.9). Once per day for now. 08/04/17   Wendie Agreste, MD  lisinopril (PRINIVIL,ZESTRIL) 5 MG tablet Take 1 tablet (5 mg total) by mouth daily. 02/02/18   Wendie Agreste, MD  metFORMIN (GLUCOPHAGE) 500 MG tablet Take 1 tablet (500 mg total) by mouth 2 (two) times daily with a meal. 02/02/18   Wendie Agreste, MD  omeprazole (PRILOSEC) 10 MG capsule Take 10 mg by mouth daily.    [provider]   Social History   Socioeconomic History  .  Marital status: Married    Spouse name: Not on file  . Number of children: 2  . Years of education: Not on file  . Highest education level: Not on file  Occupational History  . Not on file  Social Needs  . Financial resource strain: Not on file  . Food insecurity:    Worry: Not on file    Inability: Not on file  . Transportation needs:    Medical: Not on file    Non-medical: Not on file  Tobacco Use  . Smoking status: Never Smoker  . Smokeless tobacco: Never Used  Substance and Sexual Activity  . Alcohol use: Yes    Comment: weekends, occasional  . Drug use: No  . Sexual activity: Not on file  Lifestyle  . Physical activity:    Days per week: Not on file    Minutes per session: Not on file  .  Stress: Not on file  Relationships  . Social connections:    Talks on phone: Not on file    Gets together: Not on file    Attends religious service: Not on file    Active member of club or organization: Not on file    Attends meetings of clubs or organizations: Not on file    Relationship status: Not on file  . Intimate partner violence:    Fear of current or ex partner: Not on file    Emotionally abused: Not on file    Physically abused: Not on file    Forced sexual activity: Not on file  Other Topics Concern  . Not on file  Social History Narrative  . Not on file   Review of Systems  Constitutional: Negative for fatigue and unexpected weight change.  Eyes: Negative for visual disturbance.  Respiratory: Negative for cough, chest tightness and shortness of breath.   Cardiovascular: Negative for chest pain, palpitations and leg swelling.  Gastrointestinal: Negative for abdominal pain, blood in stool, diarrhea, nausea and vomiting.  Musculoskeletal: Negative for myalgias.  Neurological: Negative for dizziness, light-headedness and headaches.       Objective:   Physical Exam  Constitutional: He is oriented to person, place, and time. He appears well-developed and well-nourished.  HENT:  Head: Normocephalic and atraumatic.  Eyes: Pupils are equal, round, and reactive to light. EOM are normal.  Neck: No JVD present. Carotid bruit is not present.  Neck: there is a firm mass/area, non pulsatile over right anterior neck; does not change with swallowing  Cardiovascular: Normal rate, regular rhythm and normal heart sounds.  No murmur heard. Pulmonary/Chest: Effort normal and breath sounds normal. He has no rales.  Musculoskeletal: He exhibits no edema.  Neurological: He is alert and oriented to person, place, and time.  Skin: Skin is warm and dry.  Psychiatric: He has a normal mood and affect.  Vitals reviewed.   Vitals:   05/05/18 1601 05/05/18 1602  BP: (!) 149/88 130/90    Pulse: 65   Temp: 98.1 F (36.7 C)   TempSrc: Oral   SpO2: 98%   Weight: 254 lb (115.2 kg)   Height: 6' 4"  (1.93 m)        Assessment & Plan:   Tyrone Lee is a 47 y.o. male Type 2 diabetes mellitus without complication, without long-term current use of insulin ([place]) - Plan: Hemoglobin A1c  -Check A1c to determine dosing changes if needed for metformin.  Hyperlipidemia, unspecified hyperlipidemia type - Plan: Comprehensive metabolic panel, Lipid panel, atorvastatin (LIPITOR)  20 MG tablet  -Increase Lipitor to 20 mg daily, check labs.  Neck mass - Plan: TSH Hypercalcemia - Plan: PTH, Intact and Calcium, US Soft Tissue Head/Neck  -Fullness on right side of neck, with hypercalcemia.  Differential includes parathyroid cause versus thyroid cyst/nodule.  -Check PTH with calcium as hypercalcemia previously noted  -Check ultrasound of neck  -Further work-up/treatment to be decided after above results  Meds ordered this encounter  Medications  . atorvastatin (LIPITOR) 20 MG tablet    Sig: Take 1 tablet (20 mg total) by mouth daily at 6 PM.    Dispense:  90 tablet    Refill:  1   Patient Instructions   Increase lipitor to 2 per day.  If A1c is still elevated, would recommend changing dose of metformin. I will schedule ultrasound as well as look at some hormone tests for the fullness in the right side of your neck.  Further work-up to be determined by those results. Let me know if there are questions.    If you have lab work done today you will be contacted with your lab results within the next 2 weeks.  If you have not heard from [place] then please contact us. The fastest way to get your results is to register for My Chart.   IF you received an x-ray today, you will receive an invoice from [facility]. Please contact [facility] at [phone] with questions or concerns regarding your invoice.   IF you received labwork today, you will receive an invoice from  [facility]. Please contact [facility] at [phone] with questions or concerns regarding your invoice.   Our billing staff will not be able to assist you with questions regarding bills from these companies.  You will be contacted with the lab results as soon as they are available. The fastest way to get your results is to activate your My Chart account. Instructions are located on the last page of this paperwork. If you have not heard from [place] regarding the results in 2 weeks, please contact this office.       I personally performed the services described in this documentation, which was scribed in my presence. The recorded information has been reviewed and considered for accuracy and completeness, addended by me as needed, and agree with information above.  Signed,   Merri Ray, MD [facility] at [place].  05/07/18 10:51 AM

## 2018-05-06 LAB — COMPREHENSIVE METABOLIC PANEL
ALBUMIN: 4.7 g/dL (ref 3.5–5.5)
ALT: 28 IU/L (ref 0–44)
AST: 15 IU/L (ref 0–40)
Albumin/Globulin Ratio: 1.9 (ref 1.2–2.2)
Alkaline Phosphatase: 73 IU/L (ref 39–117)
BUN / CREAT RATIO: 14 (ref 9–20)
BUN: 13 mg/dL (ref 6–24)
Bilirubin Total: 0.9 mg/dL (ref 0.0–1.2)
CALCIUM: 10.7 mg/dL — AB (ref 8.7–10.2)
CO2: 22 mmol/L (ref 20–29)
CREATININE: 0.96 mg/dL (ref 0.76–1.27)
Chloride: 104 mmol/L (ref 96–106)
GFR calc Af Amer: 107 mL/min/{1.73_m2} (ref >59)
GFR, EST NON AFRICAN AMERICAN: 92 mL/min/{1.73_m2} (ref >59)
GLOBULIN, TOTAL: 2.5 g/dL (ref 1.5–4.5)
GLUCOSE: 100 mg/dL — AB (ref 65–99)
Potassium: 3.9 mmol/L (ref 3.5–5.2)
SODIUM: 140 mmol/L (ref 134–144)
TOTAL PROTEIN: 7.2 g/dL (ref 6.0–8.5)

## 2018-05-06 LAB — LIPID PANEL
CHOL/HDL RATIO: 4.5 ratio (ref 0.0–5.0)
CHOLESTEROL TOTAL: 180 mg/dL (ref 100–199)
HDL: 40 mg/dL (ref >39)
LDL CALC: 117 mg/dL — AB (ref 0–99)
Triglycerides: 115 mg/dL (ref 0–149)
VLDL CHOLESTEROL CAL: 23 mg/dL (ref 5–40)

## 2018-05-06 LAB — PTH, INTACT AND CALCIUM: PTH: 68 pg/mL — AB (ref 15–65)

## 2018-05-06 LAB — HEMOGLOBIN A1C
ESTIMATED AVERAGE GLUCOSE: 160 mg/dL
Hgb A1c MFr Bld: 7.2 % — ABNORMAL HIGH (ref 4.8–5.6)

## 2018-05-06 LAB — TSH: TSH: 3.33 u[IU]/mL (ref 0.450–4.500)

## 2018-05-11 ENCOUNTER — Ambulatory Visit: Payer: BLUE CROSS/BLUE SHIELD | Admitting: Family Medicine

## 2018-05-15 ENCOUNTER — Ambulatory Visit: Payer: BLUE CROSS/BLUE SHIELD | Admitting: Family Medicine

## 2018-05-18 ENCOUNTER — Telehealth: Payer: Self-pay | Admitting: Family Medicine

## 2018-05-18 ENCOUNTER — Ambulatory Visit
Admission: RE | Admit: 2018-05-18 | Discharge: 2018-05-18 | Disposition: A | Discharge status: Home / Self Care | Payer: 59 | Source: Ambulatory Visit | Attending: Family Medicine | Admitting: Family Medicine

## 2018-05-18 DIAGNOSIS — E042 Nontoxic multinodular goiter: Secondary | ICD-10-CM | POA: Diagnosis not present

## 2018-05-18 NOTE — Telephone Encounter (Signed)
Suanne Marker, Radiology Technician from [facility] called a report of Thyroid Ultrasound, result verified in the chart, read back.

## 2018-05-22 ENCOUNTER — Encounter: Payer: Self-pay | Admitting: Family Medicine

## 2018-05-25 ENCOUNTER — Encounter: Payer: Self-pay | Admitting: Family Medicine

## 2018-05-27 NOTE — Telephone Encounter (Signed)
See result note - will call and discuss with patient.

## 2018-05-29 ENCOUNTER — Telehealth: Payer: Self-pay

## 2018-05-29 NOTE — Telephone Encounter (Unsigned)
Copied from [place] [id number]. Topic: General - Call Back - No Documentation >> May 29, 2018  2:15 PM Virl Axe D wrote: Reason for CRM: Pt returned Dr. Vonna Kotyk call. Stated he is available for the rest of the day if Dr. Carlota Raspberry would like to call back. Please advise [phone]

## 2018-05-29 NOTE — Telephone Encounter (Signed)
Pt sent message - he missed Dr. Vonna Kotyk call and is available the rest of the day if he can call him back.   Message sent to Dr. Carlota Raspberry

## 2018-05-30 ENCOUNTER — Other Ambulatory Visit: Payer: Self-pay | Admitting: Family Medicine

## 2018-05-30 DIAGNOSIS — R1319 Other dysphagia: Secondary | ICD-10-CM

## 2018-05-30 DIAGNOSIS — R1310 Dysphagia, unspecified: Secondary | ICD-10-CM

## 2018-05-30 DIAGNOSIS — R591 Generalized enlarged lymph nodes: Secondary | ICD-10-CM

## 2018-05-30 DIAGNOSIS — R221 Localized swelling, mass and lump, neck: Secondary | ICD-10-CM

## 2018-05-30 NOTE — Progress Notes (Signed)
See U/S report. Will schedule CT neck with contrast, barium swallow with dysphagia and prior GI issues, refer to ENT and GI.   Will also schedule to see me for other testing/CBC and exam for other lymphadenopathy.

## 2018-06-02 ENCOUNTER — Telehealth: Payer: Self-pay | Admitting: Family Medicine

## 2018-06-02 NOTE — Telephone Encounter (Signed)
Called and spoke with pt regarding Dr. Vonna Kotyk message to call pt and get him scheduled with him next week. I was able to get him in on 06/08/18 at 4:00. I advised pt of time, building and late policy. Pt acknowledged.

## 2018-06-05 ENCOUNTER — Telehealth: Payer: Self-pay

## 2018-06-05 NOTE — Telephone Encounter (Signed)
Fax documentation re: CT -CPT [phone] determined medically necessary Effective date 05/31/2018 Exp date 07/15/2017 Memo also sent to [place]

## 2018-06-06 ENCOUNTER — Other Ambulatory Visit (HOSPITAL_COMMUNITY)
Admission: RE | Admit: 2018-06-06 | Discharge: 2018-06-06 | Disposition: A | Discharge status: Home / Self Care | Payer: BLUE CROSS/BLUE SHIELD | Source: Ambulatory Visit | Attending: Otolaryngology | Admitting: Otolaryngology

## 2018-06-06 DIAGNOSIS — R590 Localized enlarged lymph nodes: Secondary | ICD-10-CM | POA: Insufficient documentation

## 2018-06-06 DIAGNOSIS — L729 Follicular cyst of the skin and subcutaneous tissue, unspecified: Secondary | ICD-10-CM | POA: Diagnosis not present

## 2018-06-08 ENCOUNTER — Ambulatory Visit: Payer: BLUE CROSS/BLUE SHIELD | Admitting: Family Medicine

## 2018-06-09 ENCOUNTER — Other Ambulatory Visit (HOSPITAL_COMMUNITY): Payer: Self-pay | Admitting: Otolaryngology

## 2018-06-09 DIAGNOSIS — R591 Generalized enlarged lymph nodes: Secondary | ICD-10-CM

## 2018-06-12 ENCOUNTER — Other Ambulatory Visit: Payer: Self-pay | Admitting: Otolaryngology

## 2018-06-12 DIAGNOSIS — R591 Generalized enlarged lymph nodes: Secondary | ICD-10-CM

## 2018-06-15 ENCOUNTER — Other Ambulatory Visit: Payer: Self-pay | Admitting: Radiology

## 2018-06-15 ENCOUNTER — Ambulatory Visit
Admission: RE | Admit: 2018-06-15 | Discharge: 2018-06-15 | Disposition: A | Discharge status: Home / Self Care | Payer: BLUE CROSS/BLUE SHIELD | Source: Ambulatory Visit | Attending: Family Medicine | Admitting: Family Medicine

## 2018-06-15 ENCOUNTER — Ambulatory Visit
Admission: RE | Admit: 2018-06-15 | Discharge: 2018-06-15 | Disposition: A | Discharge status: Home / Self Care | Payer: BLUE CROSS/BLUE SHIELD | Source: Ambulatory Visit | Attending: Otolaryngology | Admitting: Otolaryngology

## 2018-06-15 DIAGNOSIS — R1319 Other dysphagia: Secondary | ICD-10-CM

## 2018-06-15 DIAGNOSIS — R221 Localized swelling, mass and lump, neck: Secondary | ICD-10-CM | POA: Diagnosis not present

## 2018-06-15 DIAGNOSIS — R591 Generalized enlarged lymph nodes: Secondary | ICD-10-CM

## 2018-06-15 DIAGNOSIS — R1310 Dysphagia, unspecified: Secondary | ICD-10-CM

## 2018-06-15 DIAGNOSIS — K760 Fatty (change of) liver, not elsewhere classified: Secondary | ICD-10-CM | POA: Diagnosis not present

## 2018-06-15 MED ORDER — IOPAMIDOL (ISOVUE-300) INJECTION 61%
100.0000 mL | Freq: Once | INTRAVENOUS | Status: AC | PRN
  Administered 2018-06-15: 125 mL via INTRAVENOUS

## 2018-06-16 ENCOUNTER — Telehealth: Payer: Self-pay | Admitting: Family Medicine

## 2018-06-16 ENCOUNTER — Encounter (HOSPITAL_COMMUNITY): Payer: Self-pay

## 2018-06-16 ENCOUNTER — Ambulatory Visit (HOSPITAL_COMMUNITY)
Admission: RE | Admit: 2018-06-16 | Discharge: 2018-06-16 | Disposition: A | Discharge status: Home / Self Care | Payer: BLUE CROSS/BLUE SHIELD | Source: Ambulatory Visit | Attending: Otolaryngology | Admitting: Otolaryngology

## 2018-06-16 DIAGNOSIS — E119 Type 2 diabetes mellitus without complications: Secondary | ICD-10-CM | POA: Diagnosis not present

## 2018-06-16 DIAGNOSIS — K219 Gastro-esophageal reflux disease without esophagitis: Secondary | ICD-10-CM | POA: Diagnosis not present

## 2018-06-16 DIAGNOSIS — R591 Generalized enlarged lymph nodes: Secondary | ICD-10-CM | POA: Diagnosis not present

## 2018-06-16 DIAGNOSIS — R590 Localized enlarged lymph nodes: Secondary | ICD-10-CM | POA: Diagnosis not present

## 2018-06-16 HISTORY — DX: Type 2 diabetes mellitus without complications: E11.9

## 2018-06-16 LAB — CBC WITH DIFFERENTIAL/PLATELET
Abs Immature Granulocytes: 0.03 10*3/uL (ref 0.00–0.07)
Basophils Absolute: 0.1 10*3/uL (ref 0.0–0.1)
Basophils Relative: 1 %
EOS PCT: 4 %
Eosinophils Absolute: 0.2 10*3/uL (ref 0.0–0.5)
HCT: 43.4 % (ref 39.0–52.0)
Hemoglobin: 14.7 g/dL (ref 13.0–17.0)
Immature Granulocytes: 1 %
Lymphocytes Relative: 42 %
Lymphs Abs: 2 10*3/uL (ref 0.7–4.0)
MCH: 29.7 pg (ref 26.0–34.0)
MCHC: 33.9 g/dL (ref 30.0–36.0)
MCV: 87.7 fL (ref 80.0–100.0)
MONOS PCT: 10 %
Monocytes Absolute: 0.5 10*3/uL (ref 0.1–1.0)
Neutro Abs: 2.1 10*3/uL (ref 1.7–7.7)
Neutrophils Relative %: 42 %
Platelets: 160 10*3/uL (ref 150–400)
RBC: 4.95 MIL/uL (ref 4.22–5.81)
RDW: 11.9 % (ref 11.5–15.5)
WBC: 4.9 10*3/uL (ref 4.0–10.5)
nRBC: 0 % (ref 0.0–0.2)

## 2018-06-16 LAB — BASIC METABOLIC PANEL
Anion gap: 11 (ref 5–15)
BUN: 12 mg/dL (ref 6–20)
CO2: 22 mmol/L (ref 22–32)
CREATININE: 0.92 mg/dL (ref 0.61–1.24)
Calcium: 10.2 mg/dL (ref 8.9–10.3)
Chloride: 106 mmol/L (ref 98–111)
GFR calc Af Amer: >60 mL/min (ref >60)
GFR calc non Af Amer: >60 mL/min (ref >60)
Glucose, Bld: 205 mg/dL — ABNORMAL HIGH (ref 70–99)
Potassium: 4 mmol/L (ref 3.5–5.1)
Sodium: 139 mmol/L (ref 135–145)

## 2018-06-16 LAB — PROTIME-INR
INR: 0.96
Prothrombin Time: 12.7 seconds (ref 11.4–15.2)

## 2018-06-16 MED ORDER — LIDOCAINE-EPINEPHRINE 1 %-1:100000 IJ SOLN
INTRAMUSCULAR | Status: AC
  Filled 2018-06-16: qty 1

## 2018-06-16 MED ORDER — SODIUM CHLORIDE 0.9 % IV SOLN: INTRAVENOUS | Status: DC

## 2018-06-16 NOTE — Telephone Encounter (Signed)
mychart message sent to pt about rescheduling their apt on 11/16/18

## 2018-06-16 NOTE — H&P (Signed)
Chief Complaint: Patient was seen in consultation today for right cervical LN bx at the request of Newman,Christopher E  Referring Physician(s): Newman,Christopher E  Supervising Physician: Sandi Mariscal  Patient Status: [facility] - Out-pt  History of Present Illness: Tyrone Lee is a 47 y.o. male   Nonsmoker Pt developed right cervical lymph node enlargement approx 1 mo ago Persistent; no change; nontender Dr Gwenlyn Saran biopsied in office per pt-- Neg per pt report Now MD request for needle aspiration with IR  CT yesterday: IMPRESSION: 1. Multiple abnormal soft tissue nodules up to 28 mm long axis at the right thoracic inlet (right level 4 lymph node station, right tracheoesophageal groove, and right superior mediastinum). No head or neck primary mass is identified. No contralateral lymphadenopathy. Recommend Ultrasound-guided Needle Biopsy of one of the dominant lymph nodes at the right level 4 station near the thyroid. Top differential considerations include lymph node metastases from an occult primary and Hodgkin's lymphoma. 2.  CT Chest today is reported separately.     Past Medical History:  Diagnosis Date  . Acid reflux   . Diabetes mellitus without complication ([place])   . Hemorrhoids     History reviewed. No pertinent surgical history.  Allergies: Patient has no known allergies.  Medications: Prior to Admission medications   Medication Sig Start Date End Date Taking? Authorizing Provider  atorvastatin (LIPITOR) 20 MG tablet Take 1 tablet (20 mg total) by mouth daily at 6 PM. 05/05/18  Yes Wendie Agreste, MD  lisinopril (PRINIVIL,ZESTRIL) 5 MG tablet Take 1 tablet (5 mg total) by mouth daily. 02/02/18  Yes Wendie Agreste, MD  metFORMIN (GLUCOPHAGE) 500 MG tablet Take 1 tablet (500 mg total) by mouth 2 (two) times daily with a meal. 02/02/18  Yes Wendie Agreste, MD  omeprazole (PRILOSEC) 10 MG capsule Take 10 mg by mouth daily.   Yes [provider]  ACCU-CHEK GUIDE test strip TEST UP TO FOUR TIMES A DAY AS DIRECTED (START WITH TESTING ONCE PER DAY) 04/21/18   Wendie Agreste, MD  blood glucose meter kit and supplies Dispense based on patient and insurance preference. Use up to four times daily as directed. (FOR ICD-10 E10.9, E11.9). Once per day for now. 08/04/17   Wendie Agreste, MD     Family History  Problem Relation Age of Onset  . Alcoholism Mother   . Drug abuse Mother   . Alcoholism Father   . Lung cancer Maternal Grandfather   . Lung cancer Paternal Grandfather   . Colon cancer Neg Hx   . Stomach cancer Neg Hx   . Esophageal cancer Neg Hx     Social History   Socioeconomic History  . Marital status: Married    Spouse name: Not on file  . Number of children: 2  . Years of education: Not on file  . Highest education level: Not on file  Occupational History  . Not on file  Social Needs  . Financial resource strain: Not on file  . Food insecurity:    Worry: Not on file    Inability: Not on file  . Transportation needs:    Medical: Not on file    Non-medical: Not on file  Tobacco Use  . Smoking status: Never Smoker  . Smokeless tobacco: Never Used  Substance and Sexual Activity  . Alcohol use: Yes    Comment: weekends, occasional  . Drug use: No  . Sexual activity: Not on file  Lifestyle  .  Physical activity:    Days per week: Not on file    Minutes per session: Not on file  . Stress: Not on file  Relationships  . Social connections:    Talks on phone: Not on file    Gets together: Not on file    Attends religious service: Not on file    Active member of club or organization: Not on file    Attends meetings of clubs or organizations: Not on file    Relationship status: Not on file  Other Topics Concern  . Not on file  Social History Narrative  . Not on file    Review of Systems: A 12 point ROS discussed and pertinent positives are indicated in the HPI above.  All other systems  are negative.  Review of Systems  Constitutional: Negative for activity change, fatigue and fever.  HENT: Negative for facial swelling, sore throat and trouble swallowing.   Respiratory: Negative for cough and shortness of breath.   Gastrointestinal: Negative for abdominal pain.  Neurological: Negative for weakness.  Psychiatric/Behavioral: Negative for behavioral problems and confusion.    Vital Signs: BP 135/76   Pulse 76   Temp (!) 97.4 F (36.3 C) (Oral)   Resp 16   Ht 6' 4" (1.93 m)   Wt 250 lb (113.4 kg)   SpO2 99%   BMI 30.43 kg/m   Physical Exam Vitals signs reviewed.  Constitutional:      Appearance: Normal appearance.  Neck:     Musculoskeletal: Normal range of motion.  Cardiovascular:     Rate and Rhythm: Normal rate and regular rhythm.  Pulmonary:     Effort: Pulmonary effort is normal.     Breath sounds: Normal breath sounds.  Abdominal:     General: Bowel sounds are normal.     Palpations: Abdomen is soft.  Musculoskeletal: Normal range of motion.  Skin:    General: Skin is warm and dry.  Neurological:     General: No focal deficit present.     Mental Status: He is alert and oriented to person, place, and time.  Psychiatric:        Mood and Affect: Mood normal.        Behavior: Behavior normal.        Thought Content: Thought content normal.        Judgment: Judgment normal.     Imaging: Ct Soft Tissue Neck W Contrast  Result Date: 06/15/2018 CLINICAL DATA:  47 year old male with neck mass/swelling. Concern for liver lymphoproliferative process or metastatic disease. EXAM: CT NECK WITH CONTRAST TECHNIQUE: Multidetector CT imaging of the neck was performed using the standard protocol following the bolus administration of intravenous contrast. CONTRAST:  163m ISOVUE-300 IOPAMIDOL (ISOVUE-300) INJECTION 61% in conjunction with contrast enhanced imaging of the chest reported separately. COMPARISON:  Chest CT today reported separately. FINDINGS:  Pharynx and larynx: Laryngeal and pharyngeal soft tissue contours are within normal limits. Negative parapharyngeal spaces. The retropharyngeal space appears normal aside from right tracheoesophageal groove findings beginning at the larynx, described in the lymph node section below. Salivary glands: Sublingual space, submandibular glands, and parotid glands are symmetric and within normal limits. Thyroid: The thyroid itself has a normal appearance. Abnormal regional soft tissue nodules on the right, see below. Lymph nodes: Corresponding to the abnormal ultrasound last month there are multiple abnormal rounded soft tissue nodules in the right lower neck, best demonstrated on coronal images 41 through 52. The largest is adjacent but separate from, just  lateral to the right thyroid lobe measuring 18 millimeter short axis (28 millimeters long axis). Further lateral in just cephalad to that is a smaller 14-15 millimeter short axis node with internal heterogeneity (series 15, image 86). Nearby but smaller and indeterminate 4 lymph nodes there are 8 to 10 millimeter short axis nodules closely associated with the right tracheoesophageal groove and right retropharynx from the larynx to the thoracic inlet. There is no contralateral left level 4 lymphadenopathy. The level 3, level 2, and level 1 lymph node stations are within normal limits. The level 5 stations are within normal limits. Vascular: Major vascular structures in the neck and at the skull base are patent. There is minimal atherosclerosis. Limited intracranial: Negative. Visualized orbits: Negative. Mastoids and visualized paranasal sinuses: Well pneumatized. Small mucous retention cyst in the left maxillary sinus. Skeleton: Lower cervical spine disc and endplate degeneration. No acute or suspicious osseous lesion. Upper chest: Reported separately today, but I note an 11 millimeter short axis right paratracheal lymph node at the thoracic inlet on series 15, image  108 of this exam. IMPRESSION: 1. Multiple abnormal soft tissue nodules up to 28 mm long axis at the right thoracic inlet (right level 4 lymph node station, right tracheoesophageal groove, and right superior mediastinum). No head or neck primary mass is identified. No contralateral lymphadenopathy. Recommend Ultrasound-guided Needle Biopsy of one of the dominant lymph nodes at the right level 4 station near the thyroid. Top differential considerations include lymph node metastases from an occult primary and Hodgkin's lymphoma. 2.  CT Chest today is reported separately. Electronically Signed   By: Genevie Ann M.D.   On: 06/15/2018 16:55   US Thyroid  Result Date: 05/18/2018 CLINICAL DATA:  Right thyroid fullness EXAM: THYROID ULTRASOUND TECHNIQUE: Ultrasound examination of the thyroid gland and adjacent soft tissues was performed. COMPARISON:  None. FINDINGS: Parenchymal Echotexture: Mildly heterogenous Isthmus: 5 mm Right lobe: 4.6 x 1.8 x 1.4 cm Left lobe: 4.2 x 2.2 x 1.6 cm _________________________________________________________ Estimated total number of nodules >/= 1 cm: 0 Number of spongiform nodules >/=  2 cm not described below (TR1): 0 Number of mixed cystic and solid nodules >/= 1.5 cm not described below (TR2): 0 _________________________________________________________ Minor thyroid heterogeneity without discrete focal nodule or other significant abnormality. Normal vascularity. Right neck demonstrates abnormal enlarged lymph nodes with loss of normal architecture and microcalcifications. Largest lymph node measures 2.8 x 2.3 x 2.4 cm. Adjacent abnormal lymph node measures 1.9 x 1.3 x 1.7 cm. These are concerning for lymphoproliferative process or metastatic disease. Consider further evaluation with CT neck and chest with contrast. IMPRESSION: Palpable abnormality correlates with abnormal right cervical adenopathy concerning for lymphoproliferative process or metastatic disease. See above comment and  recommendation. Nonspecific mild thyroid heterogeneity without discrete nodule or mass. These results will be called to the ordering clinician or representative by the Radiologist Assistant, and communication documented in the PACS or zVision Dashboard. The above is in keeping with the ACR TI-RADS recommendations - J Am Coll Radiol 2017;14:587-595. Electronically Signed   By: Jerilynn Mages.  Shick M.D.   On: 05/18/2018 16:47    Labs:  CBC: Recent Labs    06/16/18 0630  WBC 4.9  HGB 14.7  HCT 43.4  PLT 160    COAGS: Recent Labs    06/16/18 0630  INR 0.96    BMP: Recent Labs    08/04/17 1751 11/01/17 1713 02/02/18 1738 05/05/18 1708  NA 142 141 142 140  K 4.3 4.2 4.1 3.9  CL 103 103 101 104  CO2 _0 GLUCOSE 139* 98 102* 100*  BUN _1 CALCIUM 10.9* 10.6* 10.8* 10.7*  CREATININE 0.94 0.99 0.95 0.96  GFRNONAA 95 90 94 92  GFRAA 110 104 108 107    LIVER FUNCTION TESTS: Recent Labs    08/04/17 1751 11/01/17 1713 02/02/18 1738 05/05/18 1708  BILITOT 1.0 0.6 1.2 0.9  AST _2 ALT 45* 26 32 28  ALKPHOS 85 74 79 73  PROT 7.6 7.2 7.4 7.2  ALBUMIN 4.6 4.8 4.8 4.7    TUMOR MARKERS: No results for input(s): AFPTM, CEA, CA199, CHROMGRNA in the last 8760 hours.  Assessment and Plan:  Right cervical lymphadenopathy For biopsy today Risks and benefits discussed with the patient including, but not limited to bleeding, infection, damage to adjacent structures or low yield requiring additional tests.  All of the patient's questions were answered, patient is agreeable to proceed. Consent signed and in chart.   Thank you for this interesting consult.  I greatly enjoyed meeting Tyrone Lee [facility] and look forward to participating in their care.  A copy of this report was sent to the requesting provider on this date.  Electronically Signed: Lavonia Drafts, PA-C 06/16/2018, 7:24 AM   I spent a total of  30 Minutes   in face to face in clinical  consultation, greater than 50% of which was counseling/coordinating care for right cervical LN bx

## 2018-06-16 NOTE — Progress Notes (Signed)
Procedure completed. Tolerated well. Dressing to right neck dry and intact. D/C home walking with wife. Awake and alert. In no distress.

## 2018-06-16 NOTE — Procedures (Signed)
Pre Procedure Dx: Cervical lymphadenopathy Post Procedural Dx: Same  Technically successful US guided biopsy of indeterminate right cervical lymph node.   EBL: None  No immediate complications.   Ronny Bacon, MD Pager #: [phone]

## 2018-06-16 NOTE — Discharge Instructions (Addendum)
Needle Biopsy, Care After °Refer to this sheet in the next few weeks. These instructions provide you with information about caring for yourself after your procedure. Your health care provider may also give you more specific instructions. Your treatment has been planned according to current medical practices, but problems sometimes occur. Call your health care provider if you have any problems or questions after your procedure. °What can I expect after the procedure? °After your procedure, it is common to have soreness, bruising, or mild pain at the biopsy site. This should go away in a few days. °Follow these instructions at home: °· Rest as directed by your health care provider. °· Take medicines only as directed by your health care provider. °· There are many different ways to close and cover the biopsy site, including stitches (sutures), skin glue, and adhesive strips. Follow your health care provider's instructions about: °? Biopsy site care. °? Bandage (dressing) changes and removal. °? Biopsy site closure removal. °· Check your biopsy site every day for signs of infection. Watch for: °? Redness, swelling, or pain. °? Fluid, blood, or pus. °Contact a health care provider if: °· You have a fever. °· You have redness, swelling, or pain at the biopsy site that lasts longer than a few days. °· You have fluid, blood, or pus coming from the biopsy site. °· You feel nauseous. °· You vomit. °Get help right away if: °· You have shortness of breath. °· You have trouble breathing. °· You have chest pain. °· You feel dizzy or you faint. °· You have bleeding that does not stop with pressure or a bandage. °· You cough up blood. °· You have pain in your abdomen. °This information is not intended to replace advice given to you by your health care provider. Make sure you discuss any questions you have with your health care provider. °Document Released: 11/05/2014 Document Revised: 11/27/2015 Document Reviewed:  06/17/2014 °Elsevier Interactive Patient Education © 2018 [employer] °Moderate Conscious Sedation, Adult, Care After °These instructions provide you with information about caring for yourself after your procedure. Your health care provider may also give you more specific instructions. Your treatment has been planned according to current medical practices, but problems sometimes occur. Call your health care provider if you have any problems or questions after your procedure. °What can I expect after the procedure? °After your procedure, it is common: °· To feel sleepy for several hours. °· To feel clumsy and have poor balance for several hours. °· To have poor judgment for several hours. °· To vomit if you eat too soon. ° °Follow these instructions at home: °For at least 24 hours after the procedure: ° °· Do not: °? Participate in activities where you could fall or become injured. °? Drive. °? Use heavy machinery. °? Drink alcohol. °? Take sleeping pills or medicines that cause drowsiness. °? Make important decisions or sign legal documents. °? Take care of children on your own. °· Rest. °Eating and drinking °· Follow the diet recommended by your health care provider. °· If you vomit: °? Drink water, juice, or soup when you can drink without vomiting. °? Make sure you have little or no nausea before eating solid foods. °General instructions °· Have a responsible adult stay with you until you are awake and alert. °· Take over-the-counter and prescription medicines only as told by your health care provider. °· If you smoke, do not smoke without supervision. °· Keep all follow-up visits as told by your health care   provider. This is important. °Contact a health care provider if: °· You keep feeling nauseous or you keep vomiting. °· You feel light-headed. °· You develop a rash. °· You have a fever. °Get help right away if: °· You have trouble breathing. °This information is not intended to replace advice given to you  by your health care provider. Make sure you discuss any questions you have with your health care provider. °Document Released: 04/11/2013 Document Revised: 11/24/2015 Document Reviewed: 10/11/2015 °Elsevier Interactive Patient Education © 2018 Elsevier Inc. ° °

## 2018-07-03 ENCOUNTER — Ambulatory Visit: Payer: BLUE CROSS/BLUE SHIELD | Admitting: Family Medicine

## 2018-07-07 DIAGNOSIS — C73 Malignant neoplasm of thyroid gland: Secondary | ICD-10-CM | POA: Diagnosis not present

## 2018-07-14 NOTE — Pre-Procedure Instructions (Signed)
Chayton Murata [facility]  07/14/2018      [id number] [address], [place] [address] [place] [address] Phone: [phone] Fax: [phone]    Your procedure is scheduled on Wednesday January 15th.  Report to [facility] Admitting at 1100 A.M.  Call this number if you have problems the morning of surgery:  [phone]   Remember:  Do not eat or drink after midnight.    Take these medicines the morning of surgery with A SIP OF WATER  omeprazole (PRILOSEC)  If needed  7 days prior to surgery STOP taking any Aspirin (unless otherwise instructed by your surgeon), Aleve, Naproxen, Ibuprofen, Motrin, Advil, Goody's, BC's, all herbal medications, fish oil, and all vitamins.   WHAT DO I DO ABOUT MY DIABETES MEDICATION?   Marland Kitchen Do not take oral diabetes medicines (pills) the morning of surgery:  metFORMIN (GLUCOPHAGE).  . The day of surgery, do not take other diabetes injectables, including Byetta (exenatide), Bydureon (exenatide ER), Victoza (liraglutide), or Trulicity (dulaglutide).  . If your CBG is greater than 220 mg/dL, you may take  of your sliding scale (correction) dose of insulin.   How to Manage Your Diabetes Before and After Surgery  Why is it important to control my blood sugar before and after surgery? . Improving blood sugar levels before and after surgery helps healing and can limit problems. . A way of improving blood sugar control is eating a healthy diet by: o  Eating less sugar and carbohydrates o  Increasing activity/exercise o  Talking with your doctor about reaching your blood sugar goals . High blood sugars (greater than 180 mg/dL) can raise your risk of infections and slow your recovery, so you will need to focus on controlling your diabetes during the weeks before surgery. . Make sure that the doctor who takes care of your diabetes knows about your planned surgery including the date and location.  How  do I manage my blood sugar before surgery? . Check your blood sugar at least 4 times a day, starting 2 days before surgery, to make sure that the level is not too high or low. o Check your blood sugar the morning of your surgery when you wake up and every 2 hours until you get to the Short Stay unit. . If your blood sugar is less than 70 mg/dL, you will need to treat for low blood sugar: o Do not take insulin. o Treat a low blood sugar (less than 70 mg/dL) with  cup of clear juice (cranberry or apple), 4 glucose tablets, OR glucose gel. o Recheck blood sugar in 15 minutes after treatment (to make sure it is greater than 70 mg/dL). If your blood sugar is not greater than 70 mg/dL on recheck, call [phone] for further instructions. . Report your blood sugar to the short stay nurse when you get to Short Stay.  . If you are admitted to the hospital after surgery: o Your blood sugar will be checked by the staff and you will probably be given insulin after surgery (instead of oral diabetes medicines) to make sure you have good blood sugar levels. o The goal for blood sugar control after surgery is 80-180 mg/dL.    Do not wear jewelry.  Do not wear lotions, powders, or colognes, or deodorant.  Men may shave face and neck.  Do not bring valuables to the hospital.  [facility] is not responsible for any belongings or valuables.  Contacts, dentures or bridgework may not be worn into surgery.  Leave your suitcase in the car.  After surgery it may be brought to your room.  For patients admitted to the hospital, discharge time will be determined by your treatment team.  Patients discharged the day of surgery will not be allowed to drive home.    [facility]- Preparing For Surgery  Before surgery, you can play an important role. Because skin is not sterile, your skin needs to be as free of germs as possible. You can reduce the number of germs on your skin by washing with CHG (chlorahexidine  gluconate) Soap before surgery.  CHG is an antiseptic cleaner which kills germs and bonds with the skin to continue killing germs even after washing.    Oral Hygiene is also important to reduce your risk of infection.  Remember - BRUSH YOUR TEETH THE MORNING OF SURGERY WITH YOUR REGULAR TOOTHPASTE  Please do not use if you have an allergy to CHG or antibacterial soaps. If your skin becomes reddened/irritated stop using the CHG.  Do not shave (including legs and underarms) for at least 48 hours prior to first CHG shower. It is OK to shave your face.  Please follow these instructions carefully.   1. Shower the NIGHT BEFORE SURGERY and the MORNING OF SURGERY with CHG.   2. If you chose to wash your hair, wash your hair first as usual with your normal shampoo.  3. After you shampoo, rinse your hair and body thoroughly to remove the shampoo.  4. Use CHG as you would any other liquid soap. You can apply CHG directly to the skin and wash gently with a scrungie or a clean washcloth.   5. Apply the CHG Soap to your body ONLY FROM THE NECK DOWN.  Do not use on open wounds or open sores. Avoid contact with your eyes, ears, mouth and genitals (private parts). Wash Face and genitals (private parts)  with your normal soap.  6. Wash thoroughly, paying special attention to the area where your surgery will be performed.  7. Thoroughly rinse your body with warm water from the neck down.  8. DO NOT shower/wash with your normal soap after using and rinsing off the CHG Soap.  9. Pat yourself dry with a CLEAN TOWEL.  10. Wear CLEAN PAJAMAS to bed the night before surgery, wear comfortable clothes the morning of surgery  11. Place CLEAN SHEETS on your bed the night of your first shower and DO NOT SLEEP WITH PETS.    Day of Surgery:  Do not apply any deodorants/lotions.  Please wear clean clothes to the hospital/surgery center.   Remember to brush your teeth WITH YOUR REGULAR TOOTHPASTE.    Please  read over the following fact sheets that you were given.

## 2018-07-17 ENCOUNTER — Encounter: Payer: Self-pay | Admitting: Family Medicine

## 2018-07-17 ENCOUNTER — Ambulatory Visit: Payer: Self-pay | Admitting: Otolaryngology

## 2018-07-17 ENCOUNTER — Other Ambulatory Visit: Payer: Self-pay

## 2018-07-17 ENCOUNTER — Encounter (HOSPITAL_COMMUNITY)
Admission: RE | Admit: 2018-07-17 | Discharge: 2018-07-17 | Disposition: A | Discharge status: Home / Self Care | Payer: BLUE CROSS/BLUE SHIELD | Source: Ambulatory Visit | Attending: Otolaryngology | Admitting: Otolaryngology

## 2018-07-17 ENCOUNTER — Ambulatory Visit (HOSPITAL_COMMUNITY)
Admission: RE | Admit: 2018-07-17 | Discharge: 2018-07-17 | Disposition: A | Discharge status: Home / Self Care | Payer: BLUE CROSS/BLUE SHIELD | Source: Ambulatory Visit | Attending: Anesthesiology | Admitting: Anesthesiology

## 2018-07-17 ENCOUNTER — Encounter (HOSPITAL_COMMUNITY): Payer: Self-pay

## 2018-07-17 DIAGNOSIS — Z9089 Acquired absence of other organs: Secondary | ICD-10-CM | POA: Diagnosis not present

## 2018-07-17 DIAGNOSIS — Z01818 Encounter for other preprocedural examination: Secondary | ICD-10-CM | POA: Insufficient documentation

## 2018-07-17 HISTORY — DX: Essential (primary) hypertension: I10

## 2018-07-17 LAB — CBC
HCT: 43.5 % (ref 39.0–52.0)
Hemoglobin: 14.2 g/dL (ref 13.0–17.0)
MCH: 29.3 pg (ref 26.0–34.0)
MCHC: 32.6 g/dL (ref 30.0–36.0)
MCV: 89.9 fL (ref 80.0–100.0)
PLATELETS: 135 10*3/uL — AB (ref 150–400)
RBC: 4.84 MIL/uL (ref 4.22–5.81)
RDW: 12 % (ref 11.5–15.5)
WBC: 5 10*3/uL (ref 4.0–10.5)
nRBC: 0 % (ref 0.0–0.2)

## 2018-07-17 LAB — BASIC METABOLIC PANEL
Anion gap: 7 (ref 5–15)
BUN: 15 mg/dL (ref 6–20)
CHLORIDE: 108 mmol/L (ref 98–111)
CO2: 22 mmol/L (ref 22–32)
Calcium: 10 mg/dL (ref 8.9–10.3)
Creatinine, Ser: 1.07 mg/dL (ref 0.61–1.24)
GFR calc non Af Amer: >60 mL/min (ref >60)
Glucose, Bld: 217 mg/dL — ABNORMAL HIGH (ref 70–99)
Potassium: 4.2 mmol/L (ref 3.5–5.1)
Sodium: 137 mmol/L (ref 135–145)

## 2018-07-17 LAB — HEMOGLOBIN A1C
Hgb A1c MFr Bld: 8.1 % — ABNORMAL HIGH (ref 4.8–5.6)
Mean Plasma Glucose: 185.77 mg/dL

## 2018-07-17 LAB — GLUCOSE, CAPILLARY: Glucose-Capillary: 217 mg/dL — ABNORMAL HIGH (ref 70–99)

## 2018-07-17 NOTE — Progress Notes (Signed)
Anesthesiology chart review:  Tyrone Lee is a 48 year old male with papillary thyroid carcinoma who is scheduled to undergo thyroidectomy and right neck dissection by Dr. Radene Journey on 07/19/2018.  Preoperative ECG reveals a junctional rhythm with a rate of 71 and a narrow complex.  He apparently has no history of cardiac disease: Denies dizziness syncope or shortness of breath.  He has type 2 diabetes and hypertension and is presently taking lisinopril 5 mg a day and metformin 500 mg a day Prilosec 10 mg a day and Lipitor 20 mg.  He is not taking any beta-blockers digitalis or calcium channel blockers.  I reviewed his ECG with Dr. Sherren Mocha from the [facility] cardiology.  We both agreed that since the patient is asymptomatic and has no cardiac disease that he is acceptable candidate to undergo the surgery without any further cardiac testing at this time.  He will need cardiology evaluation postoperatively and possible extended cardiac monitoring to evaluate for any high-grade heart block or severe bradycardia.  Plan: Will proceed with general anesthesia. Consider art line and external pacing lead placement  prior to surgery.  [name], MD

## 2018-07-17 NOTE — H&P (Signed)
PREOPERATIVE H&P  Chief Complaint: enlarged right neck nodes  HPI: Tyrone Lee is a 48 y.o. male who presents for evaluation of enlarged right neck nodes that he has noted for the past 2 months. Needle aspiration of the right lower neck node revealed findings consistent with papillary thyroid carcinoma. A CT scan demonstrated multiple right neck nodes and essentially normal appearing thyroid gland. He was presented at tumor board and recommendations included total thyroidectomy and right neck dissection followed by postoperative radioactive iodine.Marland Kitchen He is taken to the operating room this time for total thyroidectomy and right neck dissection. Dr. Loanne Drilling has been contacted concerning postop follow-up from the endocrinology point of view.  Past Medical History:  Diagnosis Date  . Acid reflux   . Diabetes mellitus without complication ([place])   . Hemorrhoids   . Hypertension    No past surgical history on file. Social History   Socioeconomic History  . Marital status: Married    Spouse name: Not on file  . Number of children: 2  . Years of education: Not on file  . Highest education level: Not on file  Occupational History  . Not on file  Social Needs  . Financial resource strain: Not on file  . Food insecurity:    Worry: Not on file    Inability: Not on file  . Transportation needs:    Medical: Not on file    Non-medical: Not on file  Tobacco Use  . Smoking status: Never Smoker  . Smokeless tobacco: Never Used  Substance and Sexual Activity  . Alcohol use: Yes    Comment: weekends, occasional  . Drug use: No  . Sexual activity: Not on file  Lifestyle  . Physical activity:    Days per week: Not on file    Minutes per session: Not on file  . Stress: Not on file  Relationships  . Social connections:    Talks on phone: Not on file    Gets together: Not on file    Attends religious service: Not on file    Active member of club or organization: Not on file    Attends  meetings of clubs or organizations: Not on file    Relationship status: Not on file  Other Topics Concern  . Not on file  Social History Narrative  . Not on file   Family History  Problem Relation Age of Onset  . Alcoholism Mother   . Drug abuse Mother   . Alcoholism Father   . Lung cancer Maternal Grandfather   . Lung cancer Paternal Grandfather   . Colon cancer Neg Hx   . Stomach cancer Neg Hx   . Esophageal cancer Neg Hx    No Known Allergies Prior to Admission medications   Medication Sig Start Date End Date Taking? Authorizing Provider  atorvastatin (LIPITOR) 20 MG tablet Take 1 tablet (20 mg total) by mouth daily at 6 PM. 05/05/18  Yes Wendie Agreste, MD  lisinopril (PRINIVIL,ZESTRIL) 5 MG tablet Take 1 tablet (5 mg total) by mouth daily. 02/02/18  Yes Wendie Agreste, MD  metFORMIN (GLUCOPHAGE) 500 MG tablet Take 1 tablet (500 mg total) by mouth 2 (two) times daily with a meal. 02/02/18  Yes Wendie Agreste, MD  omeprazole (PRILOSEC) 10 MG capsule Take 10 mg by mouth daily as needed (heartburn).    Yes [provider]  ACCU-CHEK GUIDE test strip TEST UP TO FOUR TIMES A DAY AS DIRECTED (START WITH TESTING ONCE  PER DAY) 04/21/18   Wendie Agreste, MD  blood glucose meter kit and supplies Dispense based on patient and insurance preference. Use up to four times daily as directed. (FOR ICD-10 E10.9, E11.9). Once per day for now. 08/04/17   Wendie Agreste, MD     Positive ROS: negative  All other systems have been reviewed and were otherwise negative with the exception of those mentioned in the HPI and as above.  Physical Exam: There were no vitals filed for this visit.  General: Alert, no acute distress Oral: Normal oral mucosa and tonsils Nasal: Clear nasal passages Neck: several 2-3 cm right lower neck nodes palpable. No adenopathy in the left neck. Ear: Ear canal is clear with normal appearing TMs Cardiovascular: Regular rate and rhythm, no murmur.   Respiratory: Clear to auscultation Neurologic: Alert and oriented x 3   Assessment/Plan: THYROID CANCER Plan for Procedure(s): TOTAL THYROIDECTOMY AND RIGHT NECK DISSECTION RIGHT NECK DISSECTION   Melony Overly, MD 07/17/2018 10:23 AM

## 2018-07-17 NOTE — Anesthesia Preprocedure Evaluation (Addendum)
Anesthesia Evaluation  Patient identified by MRN, date of birth, ID band Patient awake    Reviewed: Allergy & Precautions, NPO status , Patient's Chart, lab work & pertinent test results  Airway Mallampati: II  TM Distance: >3 FB Neck ROM: Full    Dental  (+) Teeth Intact, Dental Advisory Given   Pulmonary    breath sounds clear to auscultation       Cardiovascular hypertension,  Rhythm:Regular Rate:Normal     Neuro/Psych    GI/Hepatic   Endo/Other  diabetes  Renal/GU      Musculoskeletal   Abdominal   Peds  Hematology   Anesthesia Other Findings   Reproductive/Obstetrics                            Anesthesia Physical Anesthesia Plan  ASA: III  Anesthesia Plan: General   Post-op Pain Management:    Induction: Intravenous  PONV Risk Score and Plan: Ondansetron  Airway Management Planned: Oral ETT  Additional Equipment:   Intra-op Plan:   Post-operative Plan: Extubation in OR  Informed Consent: I have reviewed the patients History and Physical, chart, labs and discussed the procedure including the risks, benefits and alternatives for the proposed anesthesia with the patient or authorized representative who has indicated his/her understanding and acceptance.     Dental advisory given  Plan Discussed with: CRNA and Anesthesiologist  Anesthesia Plan Comments: (See PAT note  by Karoline Caldwell, PA-C )       Anesthesia Quick Evaluation

## 2018-07-17 NOTE — Pre-Procedure Instructions (Signed)
[facility] Ingrum [facility]  07/17/2018      [id number] [address], [place] - [address] [place] [address] Phone: [phone] Fax: [phone]    Your procedure is scheduled on Wednesday January 15th.  Report to [facility] Admitting at 1100 A.M.  Call this number if you have problems the morning of surgery:  [phone]   Remember:  Do not eat or drink after midnight.    Take these medicines the morning of surgery with A SIP OF WATER  omeprazole (PRILOSEC)  If needed  7 days prior to surgery STOP taking any Aspirin (unless otherwise instructed by your surgeon), Aleve, Naproxen, Ibuprofen, Motrin, Advil, Goody's, BC's, all herbal medications, fish oil, and all vitamins.   WHAT DO I DO ABOUT MY DIABETES MEDICATION?   Marland Kitchen Do not take oral diabetes medicines (pills) the morning of surgery:  metFORMIN (GLUCOPHAGE).   How to Manage Your Diabetes Before and After Surgery  Why is it important to control my blood sugar before and after surgery? . Improving blood sugar levels before and after surgery helps healing and can limit problems. . A way of improving blood sugar control is eating a healthy diet by: o  Eating less sugar and carbohydrates o  Increasing activity/exercise o  Talking with your doctor about reaching your blood sugar goals . High blood sugars (greater than 180 mg/dL) can raise your risk of infections and slow your recovery, so you will need to focus on controlling your diabetes during the weeks before surgery. . Make sure that the doctor who takes care of your diabetes knows about your planned surgery including the date and location.  How do I manage my blood sugar before surgery? . Check your blood sugar at least 4 times a day, starting 2 days before surgery, to make sure that the level is not too high or low. o Check your blood sugar the morning of your surgery when you wake up and every 2 hours until you get to  the Short Stay unit. . If your blood sugar is less than 70 mg/dL, you will need to treat for low blood sugar: o Do not take insulin. o Treat a low blood sugar (less than 70 mg/dL) with  cup of clear juice (cranberry or apple), 4 glucose tablets, OR glucose gel. o Recheck blood sugar in 15 minutes after treatment (to make sure it is greater than 70 mg/dL). If your blood sugar is not greater than 70 mg/dL on recheck, call [phone] for further instructions. . Report your blood sugar to the short stay nurse when you get to Short Stay.  . If you are admitted to the hospital after surgery: o Your blood sugar will be checked by the staff and you will probably be given insulin after surgery (instead of oral diabetes medicines) to make sure you have good blood sugar levels. o The goal for blood sugar control after surgery is 80-180 mg/dL.    Do not wear jewelry.  Do not wear lotions, powders, or colognes, or deodorant.  Men may shave face and neck.  Do not bring valuables to the hospital.  [facility] is not responsible for any belongings or valuables.  Contacts, dentures or bridgework may not be worn into surgery.  Leave your suitcase in the car.  After surgery it may be brought to your room.  For patients admitted to the hospital, discharge time will be determined by your treatment team.  Patients discharged the day of surgery will not be allowed to drive home.    [place]- Preparing For Surgery  Before surgery, you can play an important role. Because skin is not sterile, your skin needs to be as free of germs as possible. You can reduce the number of germs on your skin by washing with CHG (chlorahexidine gluconate) Soap before surgery.  CHG is an antiseptic cleaner which kills germs and bonds with the skin to continue killing germs even after washing.    Oral Hygiene is also important to reduce your risk of infection.  Remember - BRUSH YOUR TEETH THE MORNING OF SURGERY WITH YOUR REGULAR  TOOTHPASTE  Please do not use if you have an allergy to CHG or antibacterial soaps. If your skin becomes reddened/irritated stop using the CHG.  Do not shave (including legs and underarms) for at least 48 hours prior to first CHG shower. It is OK to shave your face.  Please follow these instructions carefully.   1. Shower the NIGHT BEFORE SURGERY and the MORNING OF SURGERY with CHG.   2. If you chose to wash your hair, wash your hair first as usual with your normal shampoo.  3. After you shampoo, rinse your hair and body thoroughly to remove the shampoo.  4. Use CHG as you would any other liquid soap. You can apply CHG directly to the skin and wash gently with a scrungie or a clean washcloth.   5. Apply the CHG Soap to your body ONLY FROM THE NECK DOWN.  Do not use on open wounds or open sores. Avoid contact with your eyes, ears, mouth and genitals (private parts). Wash Face and genitals (private parts)  with your normal soap.  6. Wash thoroughly, paying special attention to the area where your surgery will be performed.  7. Thoroughly rinse your body with warm water from the neck down.  8. DO NOT shower/wash with your normal soap after using and rinsing off the CHG Soap.  9. Pat yourself dry with a CLEAN TOWEL.  10. Wear CLEAN PAJAMAS to bed the night before surgery, wear comfortable clothes the morning of surgery  11. Place CLEAN SHEETS on your bed the night of your first shower and DO NOT SLEEP WITH PETS.    Day of Surgery:  Do not apply any deodorants/lotions.  Please wear clean clothes to the hospital/surgery center.   Remember to brush your teeth WITH YOUR REGULAR TOOTHPASTE.    Please read over the following fact sheets that you were given.

## 2018-07-17 NOTE — Progress Notes (Addendum)
Anesthesia Chart Review:  Case:  [id number] Date/Time:  07/19/18 1245   Procedures:      TOTAL THYROIDECTOMY AND RIGHT NECK DISSECTION (Right )     RIGHT NECK DISSECTION (Right )   Anesthesia type:  General   Pre-op diagnosis:  THYROID CANCER   Location:  [facility] OR ROOM 10 / [place] OR   Surgeon:  Rozetta Nunnery, MD      DISCUSSION: 48 yo male never smoker. Pertinent hx includes GERD, DMII, HTN.  I was called to see pt during PAT appt due to abnormal EKG. EKG reviewed showing accelerated junctional rhythm. Rate 70 with QRS duration 56m. Pt denies any CV symptoms, says he is very active and regularly performs hard physical work with no limitations. Denies any CP, SOB, or syncope. Denies any cardiac history. Dr. JLinna Capricereviewed the EKG with Dr. CBurt Knackwho recommended to pt be seen by cardiology outpatient for additional eval. He said it was okay to proceed with upcoming surgery as planned. [facility]  I spoke with the pt to update him on Dr. CAntionette Charrecommendation. The pt asked if cardiology eval could be performed postop while he is admitted. I recommended he discuss this with both Dr. NLucia Gaskinsand Cardiology when he is called to schedule his appt. I called Dr. NLucia Gaskinsto make him aware of the situation and he will discuss with pt.  See note by Dr. JLinna Caprice1/13/20.  VS: There were no vitals taken for this visit.  PROVIDERS: GWendie Agreste MD is PCP   LABS: Labs reviewed: Acceptable for surgery. (all labs ordered are listed, but only abnormal results are displayed)  Labs Reviewed  GLUCOSE, CAPILLARY - Abnormal; Notable for the following components:      Result Value   Glucose-Capillary 217 (*)    All other components within normal limits  BASIC METABOLIC PANEL - Abnormal; Notable for the following components:   Glucose, Bld 217 (*)    All other components within normal limits  HEMOGLOBIN A1C - Abnormal; Notable for the  following components:   Hgb A1c MFr Bld 8.1 (*)    All other components within normal limits  CBC - Abnormal; Notable for the following components:   Platelets 135 (*)    All other components within normal limits     IMAGES: CT Neck 06/15/2018: IMPRESSION: 1. Multiple abnormal soft tissue nodules up to 28 mm long axis at the right thoracic inlet (right level 4 lymph node station, right tracheoesophageal groove, and right superior mediastinum). No head or neck primary mass is identified. No contralateral lymphadenopathy. Recommend Ultrasound-guided Needle Biopsy of one of the dominant lymph nodes at the right level 4 station near the thyroid. Top differential considerations include lymph node metastases from an occult primary and Hodgkin's lymphoma. 2.  CT Chest today is reported separately.  CT Chest 06/15/2018: IMPRESSION: 11 mm right paratracheal lymph node is noted. Right supraclavicular adenopathy is noted lateral and posterior to right common carotid artery as described on CT scan of the neck of the same day. As noted on that CT exam, this is concerning for possible metastatic disease or lymphoma, and tissue sampling is recommended.  Fatty infiltration of the liver.  EKG: 07/17/2018: Accelerated Junctional rhythm. Rate 71.  CV: N/A  Past Medical History:  Diagnosis Date  . Acid reflux   . Diabetes mellitus without complication ([place]   . Hemorrhoids   . Hypertension  History reviewed. No pertinent surgical history.  MEDICATIONS: . ACCU-CHEK GUIDE test strip  . atorvastatin (LIPITOR) 20 MG tablet  . blood glucose meter kit and supplies  . lisinopril (PRINIVIL,ZESTRIL) 5 MG tablet  . metFORMIN (GLUCOPHAGE) 500 MG tablet  . omeprazole (PRILOSEC) 10 MG capsule   No current facility-administered medications for this encounter.     Wynonia Musty [facility] Short Stay Center/Anesthesiology Phone [phone] 07/17/2018 9:50 AM

## 2018-07-17 NOTE — Progress Notes (Addendum)
PCP - Merri Ray, MD Cardiologist - denies  Chest x-ray - 07/17/2018 EKG - 07/17/2018 Stress Test - denies ECHO - denies Cardiac Cath - denies  Sleep Study - denies  Fasting Blood Sugar - 100-150 Checks Blood Sugar 1 time a day  Aspirin Instructions: N/A  Labs: CBC, BMP, A1C   Anesthesia review: Yes, EKG review  Patient denies shortness of breath, fever, cough and chest pain at PAT appointment   Patient verbalized understanding of instructions that were given to them at the PAT appointment. Patient was also instructed that they will need to review over the PAT instructions again at home before surgery.

## 2018-07-19 ENCOUNTER — Inpatient Hospital Stay (HOSPITAL_COMMUNITY)
Admission: RE | Admit: 2018-07-19 | Discharge: 2018-07-21 | DRG: 627 | Disposition: A | Discharge status: Home / Self Care | Payer: BLUE CROSS/BLUE SHIELD | Attending: Otolaryngology | Admitting: Otolaryngology

## 2018-07-19 ENCOUNTER — Encounter (HOSPITAL_COMMUNITY): Payer: Self-pay | Admitting: Anesthesiology

## 2018-07-19 ENCOUNTER — Telehealth: Payer: Self-pay | Admitting: Cardiology

## 2018-07-19 ENCOUNTER — Ambulatory Visit (HOSPITAL_COMMUNITY): Payer: BLUE CROSS/BLUE SHIELD | Admitting: Anesthesiology

## 2018-07-19 ENCOUNTER — Encounter (HOSPITAL_COMMUNITY)
Admission: RE | Disposition: A | Discharge status: Home / Self Care | Payer: Self-pay | Source: Home / Self Care | Attending: Otolaryngology

## 2018-07-19 ENCOUNTER — Other Ambulatory Visit: Payer: Self-pay

## 2018-07-19 ENCOUNTER — Ambulatory Visit (HOSPITAL_COMMUNITY): Payer: BLUE CROSS/BLUE SHIELD | Admitting: Physician Assistant

## 2018-07-19 DIAGNOSIS — C73 Malignant neoplasm of thyroid gland: Principal | ICD-10-CM | POA: Diagnosis present

## 2018-07-19 DIAGNOSIS — R590 Localized enlarged lymph nodes: Secondary | ICD-10-CM | POA: Diagnosis not present

## 2018-07-19 DIAGNOSIS — E119 Type 2 diabetes mellitus without complications: Secondary | ICD-10-CM | POA: Diagnosis not present

## 2018-07-19 DIAGNOSIS — E063 Autoimmune thyroiditis: Secondary | ICD-10-CM | POA: Diagnosis not present

## 2018-07-19 DIAGNOSIS — Z79899 Other long term (current) drug therapy: Secondary | ICD-10-CM

## 2018-07-19 DIAGNOSIS — C7989 Secondary malignant neoplasm of other specified sites: Secondary | ICD-10-CM | POA: Diagnosis not present

## 2018-07-19 DIAGNOSIS — E213 Hyperparathyroidism, unspecified: Secondary | ICD-10-CM | POA: Diagnosis not present

## 2018-07-19 DIAGNOSIS — Z7984 Long term (current) use of oral hypoglycemic drugs: Secondary | ICD-10-CM | POA: Diagnosis not present

## 2018-07-19 DIAGNOSIS — I10 Essential (primary) hypertension: Secondary | ICD-10-CM | POA: Diagnosis not present

## 2018-07-19 DIAGNOSIS — K219 Gastro-esophageal reflux disease without esophagitis: Secondary | ICD-10-CM | POA: Diagnosis not present

## 2018-07-19 DIAGNOSIS — C770 Secondary and unspecified malignant neoplasm of lymph nodes of head, face and neck: Secondary | ICD-10-CM | POA: Diagnosis not present

## 2018-07-19 HISTORY — PX: RADICAL NECK DISSECTION: SHX2284

## 2018-07-19 HISTORY — PX: THYROIDECTOMY: SHX17

## 2018-07-19 LAB — GLUCOSE, CAPILLARY
GLUCOSE-CAPILLARY: 202 mg/dL — AB (ref 70–99)
Glucose-Capillary: 197 mg/dL — ABNORMAL HIGH (ref 70–99)

## 2018-07-19 SURGERY — THYROIDECTOMY
Anesthesia: General | Laterality: Right

## 2018-07-19 MED ORDER — LIDOCAINE-EPINEPHRINE 1 %-1:100000 IJ SOLN
INTRAMUSCULAR | Status: DC | PRN
  Administered 2018-07-19: 10 mL

## 2018-07-19 MED ORDER — CEFAZOLIN SODIUM 1 G IJ SOLR
INTRAMUSCULAR | Status: AC
  Filled 2018-07-19: qty 20

## 2018-07-19 MED ORDER — MORPHINE SULFATE (PF) 2 MG/ML IV SOLN: 2.0000 mg | INTRAVENOUS | Status: DC | PRN

## 2018-07-19 MED ORDER — ONDANSETRON HCL 4 MG PO TABS: 4.0000 mg | ORAL_TABLET | ORAL | Status: DC | PRN

## 2018-07-19 MED ORDER — MUPIROCIN 2 % EX OINT
1.0000 "application " | TOPICAL_OINTMENT | Freq: Three times a day (TID) | CUTANEOUS | Status: DC
  Administered 2018-07-19 – 2018-07-21 (×5): 1 via TOPICAL

## 2018-07-19 MED ORDER — CEFAZOLIN SODIUM-DEXTROSE 1-4 GM/50ML-% IV SOLN
1.0000 g | Freq: Three times a day (TID) | INTRAVENOUS | Status: DC
  Administered 2018-07-19 – 2018-07-21 (×5): 1 g via INTRAVENOUS
  Filled 2018-07-19 (×6): qty 50

## 2018-07-19 MED ORDER — ONDANSETRON HCL 4 MG/2ML IJ SOLN
INTRAMUSCULAR | Status: DC | PRN
  Administered 2018-07-19: 4 mg via INTRAVENOUS

## 2018-07-19 MED ORDER — LIDOCAINE 2% (20 MG/ML) 5 ML SYRINGE
INTRAMUSCULAR | Status: AC
  Filled 2018-07-19: qty 5

## 2018-07-19 MED ORDER — HYDROCODONE-ACETAMINOPHEN 5-325 MG PO TABS: 1.0000 | ORAL_TABLET | ORAL | Status: DC | PRN

## 2018-07-19 MED ORDER — DEXAMETHASONE SODIUM PHOSPHATE 10 MG/ML IJ SOLN
INTRAMUSCULAR | Status: DC | PRN
  Administered 2018-07-19: 5 mg via INTRAVENOUS

## 2018-07-19 MED ORDER — OXYCODONE HCL 5 MG PO TABS: 5.0000 mg | ORAL_TABLET | Freq: Once | ORAL | Status: DC | PRN

## 2018-07-19 MED ORDER — PROPOFOL 10 MG/ML IV BOLUS
INTRAVENOUS | Status: AC
  Filled 2018-07-19: qty 40

## 2018-07-19 MED ORDER — DEXAMETHASONE SODIUM PHOSPHATE 10 MG/ML IJ SOLN
INTRAMUSCULAR | Status: AC
  Filled 2018-07-19: qty 1

## 2018-07-19 MED ORDER — SODIUM CHLORIDE 0.9 % IV SOLN
INTRAVENOUS | Status: DC | PRN
  Administered 2018-07-19: 15 ug/min via INTRAVENOUS

## 2018-07-19 MED ORDER — FENTANYL CITRATE (PF) 250 MCG/5ML IJ SOLN
INTRAMUSCULAR | Status: AC
  Filled 2018-07-19: qty 5

## 2018-07-19 MED ORDER — METFORMIN HCL 500 MG PO TABS
500.0000 mg | ORAL_TABLET | Freq: Two times a day (BID) | ORAL | Status: DC
  Administered 2018-07-20 – 2018-07-21 (×3): 500 mg via ORAL
  Filled 2018-07-19 (×3): qty 1

## 2018-07-19 MED ORDER — LACTATED RINGERS IV SOLN
INTRAVENOUS | Status: DC
  Administered 2018-07-19 (×3): via INTRAVENOUS

## 2018-07-19 MED ORDER — OXYCODONE HCL 5 MG/5ML PO SOLN: 5.0000 mg | Freq: Once | ORAL | Status: DC | PRN

## 2018-07-19 MED ORDER — CEFAZOLIN SODIUM-DEXTROSE 2-4 GM/100ML-% IV SOLN
2.0000 g | INTRAVENOUS | Status: AC
  Administered 2018-07-19 (×2): 2 g via INTRAVENOUS
  Filled 2018-07-19: qty 100

## 2018-07-19 MED ORDER — ROCURONIUM BROMIDE 50 MG/5ML IV SOSY
PREFILLED_SYRINGE | INTRAVENOUS | Status: AC
  Filled 2018-07-19: qty 10

## 2018-07-19 MED ORDER — ONDANSETRON HCL 4 MG/2ML IJ SOLN
4.0000 mg | INTRAMUSCULAR | Status: DC | PRN
  Filled 2018-07-19: qty 2

## 2018-07-19 MED ORDER — ROCURONIUM BROMIDE 50 MG/5ML IV SOSY
PREFILLED_SYRINGE | INTRAVENOUS | Status: AC
  Filled 2018-07-19: qty 15

## 2018-07-19 MED ORDER — BACITRACIN ZINC 500 UNIT/GM EX OINT: 1.0000 "application " | TOPICAL_OINTMENT | Freq: Three times a day (TID) | CUTANEOUS | Status: DC

## 2018-07-19 MED ORDER — 0.9 % SODIUM CHLORIDE (POUR BTL) OPTIME
TOPICAL | Status: DC | PRN
  Administered 2018-07-19: 1000 mL

## 2018-07-19 MED ORDER — MUPIROCIN 2 % EX OINT
TOPICAL_OINTMENT | CUTANEOUS | Status: AC
  Filled 2018-07-19: qty 22

## 2018-07-19 MED ORDER — PROPOFOL 10 MG/ML IV BOLUS
INTRAVENOUS | Status: DC | PRN
  Administered 2018-07-19: 200 mg via INTRAVENOUS

## 2018-07-19 MED ORDER — ATORVASTATIN CALCIUM 10 MG PO TABS
20.0000 mg | ORAL_TABLET | Freq: Every day | ORAL | Status: DC
  Administered 2018-07-20: 20 mg via ORAL
  Filled 2018-07-19: qty 2

## 2018-07-19 MED ORDER — BACITRACIN ZINC 500 UNIT/GM EX OINT
TOPICAL_OINTMENT | CUTANEOUS | Status: AC
  Filled 2018-07-19: qty 28.35

## 2018-07-19 MED ORDER — MIDAZOLAM HCL 5 MG/5ML IJ SOLN
INTRAMUSCULAR | Status: DC | PRN
  Administered 2018-07-19: 2 mg via INTRAVENOUS

## 2018-07-19 MED ORDER — CHLORHEXIDINE GLUCONATE CLOTH 2 % EX PADS: 6.0000 | MEDICATED_PAD | Freq: Once | CUTANEOUS | Status: DC

## 2018-07-19 MED ORDER — ONDANSETRON HCL 4 MG/2ML IJ SOLN: 4.0000 mg | Freq: Once | INTRAMUSCULAR | Status: DC | PRN

## 2018-07-19 MED ORDER — MIDAZOLAM HCL 2 MG/2ML IJ SOLN
INTRAMUSCULAR | Status: AC
  Filled 2018-07-19: qty 2

## 2018-07-19 MED ORDER — ROCURONIUM BROMIDE 10 MG/ML (PF) SYRINGE
PREFILLED_SYRINGE | INTRAVENOUS | Status: DC | PRN
  Administered 2018-07-19: 50 mg via INTRAVENOUS
  Administered 2018-07-19: 30 mg via INTRAVENOUS
  Administered 2018-07-19: 20 mg via INTRAVENOUS
  Administered 2018-07-19: 50 mg via INTRAVENOUS
  Administered 2018-07-19: 20 mg via INTRAVENOUS
  Administered 2018-07-19: 30 mg via INTRAVENOUS
  Administered 2018-07-19: 50 mg via INTRAVENOUS
  Administered 2018-07-19 (×2): 30 mg via INTRAVENOUS
  Administered 2018-07-19: 20 mg via INTRAVENOUS
  Administered 2018-07-19: 50 mg via INTRAVENOUS
  Administered 2018-07-19: 20 mg via INTRAVENOUS

## 2018-07-19 MED ORDER — IBUPROFEN 100 MG/5ML PO SUSP
400.0000 mg | Freq: Four times a day (QID) | ORAL | Status: DC | PRN
  Filled 2018-07-19: qty 20

## 2018-07-19 MED ORDER — SUGAMMADEX SODIUM 200 MG/2ML IV SOLN
INTRAVENOUS | Status: DC | PRN
  Administered 2018-07-19: 200 mg via INTRAVENOUS

## 2018-07-19 MED ORDER — LIDOCAINE 2% (20 MG/ML) 5 ML SYRINGE
INTRAMUSCULAR | Status: DC | PRN
  Administered 2018-07-19: 60 mg via INTRAVENOUS

## 2018-07-19 MED ORDER — BACITRACIN ZINC 500 UNIT/GM EX OINT
TOPICAL_OINTMENT | CUTANEOUS | Status: DC | PRN
  Administered 2018-07-19: 1 via TOPICAL

## 2018-07-19 MED ORDER — ROCURONIUM BROMIDE 50 MG/5ML IV SOSY
PREFILLED_SYRINGE | INTRAVENOUS | Status: AC
  Filled 2018-07-19: qty 5

## 2018-07-19 MED ORDER — FENTANYL CITRATE (PF) 100 MCG/2ML IJ SOLN: 25.0000 ug | INTRAMUSCULAR | Status: DC | PRN

## 2018-07-19 MED ORDER — LISINOPRIL 5 MG PO TABS
5.0000 mg | ORAL_TABLET | Freq: Every day | ORAL | Status: DC
  Administered 2018-07-19 – 2018-07-21 (×3): 5 mg via ORAL
  Filled 2018-07-19 (×3): qty 1

## 2018-07-19 MED ORDER — PANTOPRAZOLE SODIUM 40 MG PO TBEC
40.0000 mg | DELAYED_RELEASE_TABLET | Freq: Every day | ORAL | Status: DC
  Administered 2018-07-19 – 2018-07-21 (×3): 40 mg via ORAL
  Filled 2018-07-19 (×3): qty 1

## 2018-07-19 MED ORDER — FENTANYL CITRATE (PF) 100 MCG/2ML IJ SOLN
INTRAMUSCULAR | Status: DC | PRN
  Administered 2018-07-19 (×2): 50 ug via INTRAVENOUS
  Administered 2018-07-19 (×2): 100 ug via INTRAVENOUS
  Administered 2018-07-19 (×2): 50 ug via INTRAVENOUS

## 2018-07-19 MED ORDER — POTASSIUM CHLORIDE IN NACL 20-0.9 MEQ/L-% IV SOLN
INTRAVENOUS | Status: DC
  Administered 2018-07-19 – 2018-07-21 (×2): via INTRAVENOUS
  Filled 2018-07-19: qty 1000
  Filled 2018-07-19: qty 2000

## 2018-07-19 MED ORDER — CALCIUM CARBONATE-VITAMIN D 500-200 MG-UNIT PO TABS
2.0000 | ORAL_TABLET | Freq: Two times a day (BID) | ORAL | Status: DC
  Administered 2018-07-19 – 2018-07-21 (×4): 2 via ORAL
  Filled 2018-07-19 (×4): qty 2

## 2018-07-19 MED ORDER — MUPIROCIN 2 % EX OINT
TOPICAL_OINTMENT | CUTANEOUS | Status: DC | PRN
  Administered 2018-07-19: 1 via TOPICAL

## 2018-07-19 MED ORDER — LIDOCAINE-EPINEPHRINE 1 %-1:100000 IJ SOLN
INTRAMUSCULAR | Status: AC
  Filled 2018-07-19: qty 1

## 2018-07-19 MED ORDER — ONDANSETRON HCL 4 MG/2ML IJ SOLN
INTRAMUSCULAR | Status: AC
  Filled 2018-07-19: qty 2

## 2018-07-19 SURGICAL SUPPLY — 75 items
ATTRACTOMAT 16X20 MAGNETIC DRP (DRAPES) ×2 IMPLANT
BLADE CLIPPER SURG (BLADE) IMPLANT
BLADE SURG 10 STRL SS (BLADE) ×2 IMPLANT
BLADE SURG 15 STRL LF DISP TIS (BLADE) ×1 IMPLANT
BLADE SURG 15 STRL SS (BLADE) ×2
CANISTER SUCT 3000ML PPV (MISCELLANEOUS) ×2 IMPLANT
CLEANER TIP ELECTROSURG 2X2 (MISCELLANEOUS) ×2 IMPLANT
CONT SPEC 4OZ CLIKSEAL STRL BL (MISCELLANEOUS) ×7 IMPLANT
CORD BIPOLAR FORCEPS 12FT (ELECTRODE) IMPLANT
CORDS BIPOLAR (ELECTRODE) ×2 IMPLANT
COVER SURGICAL LIGHT HANDLE (MISCELLANEOUS) ×2 IMPLANT
COVER WAND RF STERILE (DRAPES) ×2 IMPLANT
DECANTER SPIKE VIAL GLASS SM (MISCELLANEOUS) ×2 IMPLANT
DRAIN HEMOVAC 7FR (DRAIN) ×1 IMPLANT
DRAIN SNY 7 FPER (WOUND CARE) IMPLANT
DRAPE HALF SHEET 40X57 (DRAPES) IMPLANT
ELECT COATED BLADE 2.86 ST (ELECTRODE) ×2 IMPLANT
ELECT REM PT RETURN 9FT ADLT (ELECTROSURGICAL) ×2
ELECTRODE REM PT RTRN 9FT ADLT (ELECTROSURGICAL) ×1 IMPLANT
EVACUATOR 3/16  PVC DRAIN (DRAIN)
EVACUATOR 3/16 PVC DRAIN (DRAIN) IMPLANT
EVACUATOR SILICONE 100CC (DRAIN) IMPLANT
FORCEPS BIPOLAR SPETZLER 8 1.0 (NEUROSURGERY SUPPLIES) IMPLANT
GAUZE 4X4 16PLY RFD (DISPOSABLE) ×6 IMPLANT
GAUZE SPONGE 4X4 12PLY STRL (GAUZE/BANDAGES/DRESSINGS) ×2 IMPLANT
GAUZE SPONGE 4X4 16PLY XRAY LF (GAUZE/BANDAGES/DRESSINGS) ×3 IMPLANT
GLOVE BIO SURGEON STRL SZ 6.5 (GLOVE) ×1 IMPLANT
GLOVE BIO SURGEON STRL SZ7 (GLOVE) ×1 IMPLANT
GLOVE BIO SURGEON STRL SZ7.5 (GLOVE) ×1 IMPLANT
GLOVE BIOGEL PI IND STRL 6.5 (GLOVE) IMPLANT
GLOVE BIOGEL PI IND STRL 7.0 (GLOVE) IMPLANT
GLOVE BIOGEL PI INDICATOR 6.5 (GLOVE) ×2
GLOVE BIOGEL PI INDICATOR 7.0 (GLOVE) ×4
GLOVE SS BIOGEL STRL SZ 7 (GLOVE) IMPLANT
GLOVE SS BIOGEL STRL SZ 7.5 (GLOVE) ×2 IMPLANT
GLOVE SUPERSENSE BIOGEL SZ 7 (GLOVE) ×2
GLOVE SUPERSENSE BIOGEL SZ 7.5 (GLOVE) ×2
GLOVE SURG SS PI 6.0 STRL IVOR (GLOVE) ×2 IMPLANT
GOWN STRL REUS W/ TWL LRG LVL3 (GOWN DISPOSABLE) ×1 IMPLANT
GOWN STRL REUS W/ TWL XL LVL3 (GOWN DISPOSABLE) ×2 IMPLANT
GOWN STRL REUS W/TWL LRG LVL3 (GOWN DISPOSABLE) ×6
GOWN STRL REUS W/TWL XL LVL3 (GOWN DISPOSABLE) ×4
KIT BASIN OR (CUSTOM PROCEDURE TRAY) ×2 IMPLANT
KIT TURNOVER KIT B (KITS) ×2 IMPLANT
LOCATOR NERVE 3 VOLT (DISPOSABLE) IMPLANT
MARKER SKIN DUAL TIP RULER LAB (MISCELLANEOUS) ×2 IMPLANT
NDL HYPO 25GX1X1/2 BEV (NEEDLE) ×1 IMPLANT
NEEDLE HYPO 25GX1X1/2 BEV (NEEDLE) ×2 IMPLANT
NS IRRIG 1000ML POUR BTL (IV SOLUTION) ×2 IMPLANT
PAD ARMBOARD 7.5X6 YLW CONV (MISCELLANEOUS) ×4 IMPLANT
PENCIL BUTTON HOLSTER BLD 10FT (ELECTRODE) ×2 IMPLANT
SPECIMEN JAR MEDIUM (MISCELLANEOUS) ×2 IMPLANT
SPONGE INTESTINAL PEANUT (DISPOSABLE) ×4 IMPLANT
SPONGE LAP 18X18 X RAY DECT (DISPOSABLE) ×2 IMPLANT
STAPLER VISISTAT 35W (STAPLE) ×2 IMPLANT
SUCTION HEMOVAC SNY HYST (SUCTIONS) IMPLANT
SURGICEL SNOW 2X4 (HEMOSTASIS) ×1 IMPLANT
SUT CHROMIC 3 0 PS 2 (SUTURE) ×6 IMPLANT
SUT CHROMIC GUT 2 0 PS 2 27 (SUTURE) ×4 IMPLANT
SUT ETHILON 2 0 FS 18 (SUTURE) IMPLANT
SUT ETHILON 5 0 P 3 18 (SUTURE) ×1
SUT ETHILON 5 0 PS 2 18 (SUTURE) ×1 IMPLANT
SUT ETHILON 6 0 P 1 (SUTURE) ×2 IMPLANT
SUT NYLON ETHILON 5-0 P-3 1X18 (SUTURE) IMPLANT
SUT SILK 2 0 (SUTURE) ×6
SUT SILK 2 0 PERMA HAND 18 BK (SUTURE) ×2 IMPLANT
SUT SILK 2 0 SH CR/8 (SUTURE) ×2 IMPLANT
SUT SILK 2-0 18XBRD TIE 12 (SUTURE) ×2 IMPLANT
SUT SILK 3 0 (SUTURE) ×8
SUT SILK 3-0 18XBRD TIE 12 (SUTURE) ×3 IMPLANT
TAPE CLOTH SURG 4X10 WHT LF (GAUZE/BANDAGES/DRESSINGS) ×1 IMPLANT
TOWEL NATURAL 6PK STERILE (DISPOSABLE) ×2 IMPLANT
TRAY ENT MC OR (CUSTOM PROCEDURE TRAY) ×2 IMPLANT
TRAY FOLEY CATH 16FR SILVER (SET/KITS/TRAYS/PACK) IMPLANT
TUBING SUCTION BULK 100 FT (MISCELLANEOUS) ×1 IMPLANT

## 2018-07-19 NOTE — Anesthesia Postprocedure Evaluation (Signed)
Anesthesia Post Note  Patient: Tyrone Lee [facility]  Procedure(s) Performed: TOTAL THYROIDECTOMY AND RIGHT NECK DISSECTION (Right ) RIGHT NECK DISSECTION (Right )     Patient location during evaluation: PACU Anesthesia Type: General Level of consciousness: awake and alert Pain management: pain level controlled Vital Signs Assessment: post-procedure vital signs reviewed and stable Respiratory status: spontaneous breathing, nonlabored ventilation, respiratory function stable and patient connected to nasal cannula oxygen Cardiovascular status: blood pressure returned to baseline and stable Postop Assessment: no apparent nausea or vomiting Anesthetic complications: no    Last Vitals:  Vitals:   07/19/18 2125 07/19/18 2140  BP: (!) 146/88 (!) 149/96  Pulse: 87 85  Resp: 20 17  Temp:    SpO2: 95% 96%    Last Pain:  Vitals:   07/19/18 2125  TempSrc:   PainSc: 0-No pain                 Belynda Pagaduan COKER

## 2018-07-19 NOTE — Anesthesia Procedure Notes (Signed)
Procedure Name: Intubation Date/Time: 07/19/2018 1:54 PM Performed by: Barrington Ellison, CRNA Pre-anesthesia Checklist: Patient identified, Emergency Drugs available, Suction available and Patient being monitored Patient Re-evaluated:Patient Re-evaluated prior to induction Oxygen Delivery Method: Circle System Utilized Preoxygenation: Pre-oxygenation with 100% oxygen Induction Type: IV induction Ventilation: Mask ventilation without difficulty and Oral airway inserted - appropriate to patient size Laryngoscope Size: Mac and 4 Grade View: Grade I Tube type: Oral Tube size: 7.5 mm Number of attempts: 1 Airway Equipment and Method: Stylet and Oral airway Placement Confirmation: ETT inserted through vocal cords under direct vision,  positive ETCO2 and breath sounds checked- equal and bilateral Secured at: 22 cm Tube secured with: Tape Dental Injury: Teeth and Oropharynx as per pre-operative assessment

## 2018-07-19 NOTE — Interval H&P Note (Signed)
History and Physical Interval Note:  07/19/2018 1:31 PM  Tyrone Lee  has presented today for surgery, with the diagnosis of THYROID CANCER  The various methods of treatment have been discussed with the patient and family. After consideration of risks, benefits and other options for treatment, the patient has consented to  Procedure(s): TOTAL THYROIDECTOMY AND RIGHT NECK DISSECTION (Right) RIGHT NECK DISSECTION (Right) as a surgical intervention .  The patient's history has been reviewed, patient examined, no change in status, stable for surgery.  I have reviewed the patient's chart and labs.  Questions were answered to the patient's satisfaction.     Melony Overly

## 2018-07-19 NOTE — Op Note (Signed)
NAME: Tyrone Lee, Tyrone Lee MEDICAL RECORD IO:[id number] ACCOUNT [id number] DATE OF BIRTH:[birth year] FACILITY: [facility] LOCATION: [facility] PHYSICIAN:CHRISTOPHER Lincoln Maxin, MD  OPERATIVE REPORT  DATE OF PROCEDURE:  07/19/2018  PREOPERATIVE DIAGNOSIS:  Metastatic papillary thyroid carcinoma to right neck nodes.  POSTOPERATIVE DIAGNOSIS:  Metastatic papillary thyroid carcinoma to right neck nodes.  OPERATION PERFORMED:  Total thyroidectomy with a right neck dissection.  SURGEON:  Melony Overly, MD  ASSISTANT SURGEON:  Ralene Ok, MD  ANESTHESIA:  General endotracheal.  ESTIMATED BLOOD LOSS:  75 mL.  COMPLICATIONS:  None.  BRIEF CLINICAL NOTE:  The patient is a 48 year old gentleman who presented initially with enlarged right neck nodes.  Needle aspiration of the neck nodes demonstrated a papillary thyroid carcinoma.  A CT scan was performed that showed several enlarged  right lower neck nodes as well as a node in the central compartment inferior and behind the right thyroid lobe.  The patient had some fullness in the superior aspect of the right thyroid lobe noted on the CT scan.  He was taken to the operating room at  this time for right neck dissection and total thyroidectomy.  DESCRIPTION OF PROCEDURE:  After adequate endotracheal anesthesia, the patient received 2 g of Ancef IV preoperatively.  A standard thyroid incision was made that extended father up on the right neck.  Subplatysmal flaps were elevated superiorly.  Dr.  Norma Fredrickson was able to assist me on the total thyroidectomy.  First, the strap muscles were retracted and dissected off of the thyroid lobes on both sides.  Next, the right thyroid lobe was dissected out using cautery and blunt dissection.  Superiorly,  there was some tissue that appeared to represent parathyroid tissue that was preserved.  The superior thyroid vessels were clamped and ligated with 3-0 silk ties.  Next, the inferior lobe was dissected out and  vessels were ligated with 3-0 silk sutures  and divided.  It was felt that the inferior recurrent nerve on the right side was identified, and dissection was carried out to preserve this throughout the dissection, although superiorly what appeared to a firm mass in the superior aspect of the  thyroid on the right side was close to the nerve.  There was a discrete mass inferior to the thyroid lobe on the right side that would be dissected out later.  The isthmus was divided in order to resect the right thyroid lobe as the nerve was close to  the tumor on the right side where the tumor was more posteriorly and superiorly within the right thyroid lobe.  The right thyroid lobe was removed and sent as a separate specimen.  Next, the left thyroid lobe was dissected out.  The recurrent nerve was  identified inferiorly on the left side.  Superior thyroid vessels were divided and ligated with 3-0 silk sutures as well as the inferior thyroid vessels were divided.  There was some tissue inferiorly that was thought to represent parathyroid tissue that  was preserved.  There was actually some irregular thyroid tissue superiorly adjacent to the trachea on the left side.  This was carefully dissected off of the trachea.  Hemostasis was obtained with the bipolar cautery.  The thyroid isthmus and central  compartment with some lymph nodes were included with the right thyroid lobe.  So the second specimen represented a right thyroid lobe and some central lymph nodes in the central compartment of the neck.  Actually, the left side included the left thyroid  lobe and the left central compartment  and the thyroid isthmus.  Following removal of the right and left thyroid lobes, the mass was easily palpable inferior to the thyroid gland.  This was carefully dissected out as an inferior right posterior thyroid  lymph node or mass.  After removing this, there was another palpable mass just above this that was dissected out bluntly.   This was felt to possibly represent a parathyroid adenoma, and it was sent as a separate specimen as possible parathyroid adenoma.   Hemostasis was obtained with bipolar cautery.  Following the total thyroidectomy, the right neck dissection was performed by myself.  The patient had 2 enlarged inferior nodes that were posterior to the   jugular vein and the carotid artery.  The  jugular vein was preserved throughout the dissection.  The superior aspect of this dissection went up to the level of the hyoid bone.  The more inferior medial node on the right neck was posterior to the carotid artery and posterior to the vagus nerve.   The vagus nerve was dissected out and preserved throughout the dissection.  The jugular vein likewise was preserved.  Some branches were ligated with 3-0 silk sutures and divided.  Using bipolar cautery, the inferior deep node was dissected out from  below the jugular vein and the carotid artery.  Bipolar cautery was used for hemostasis.  This space actually communicated with the space involved in resecting the right thyroid lobe.  After obtaining adequate hemostasis, the wound was irrigated with  saline.  A medium large Hemovac drain was placed in the right neck.  SNoW was placed in the bed of the thyroid gland.  The incision was closed with 2-0 chromic suture subcutaneously and 5-0 nylon to reapproximate the skin edges.  Hemovac drain was  secured with a 2-0 silk suture.  This completed the procedure.  The patient was subsequently awoken from anesthesia and transferred to recovery room and subsequently admitted for 2 days in the hospital.  LN/NUANCE  D:07/19/2018 T:07/19/2018 JOB:[id number]

## 2018-07-19 NOTE — Transfer of Care (Signed)
Immediate Anesthesia Transfer of Care Note  Patient: Tyrone Lee  Procedure(s) Performed: TOTAL THYROIDECTOMY AND RIGHT NECK DISSECTION (Right ) RIGHT NECK DISSECTION (Right )  Patient Location: PACU  Anesthesia Type:General  Level of Consciousness: sedated  Airway & Oxygen Therapy: Patient Spontanous Breathing and Patient connected to nasal cannula oxygen  Post-op Assessment: Report given to RN and Post -op Vital signs reviewed and stable  Post vital signs: Reviewed and stable  Last Vitals:  Vitals Value Taken Time  BP 136/97 07/19/2018  7:24 PM  Temp    Pulse 88 07/19/2018  7:27 PM  Resp 21 07/19/2018  7:27 PM  SpO2 96 % 07/19/2018  7:27 PM  Vitals shown include unvalidated device data.  Last Pain:  Vitals:   07/19/18 1153  TempSrc:   PainSc: 0-No pain      Patients Stated Pain Goal: 2 (06/22/74 8832)  Complications: No apparent anesthesia complications

## 2018-07-19 NOTE — Brief Op Note (Signed)
07/19/2018  7:03 PM  PATIENT:  Tyrone Lee  48 y.o. male  PRE-OPERATIVE DIAGNOSIS:  THYROID CANCER  POST-OPERATIVE DIAGNOSIS:  Thyroid Canced  PROCEDURE:  Procedure(s): TOTAL THYROIDECTOMY AND RIGHT NECK DISSECTION (Right) RIGHT NECK DISSECTION (Right)  SURGEON:  Surgeon(s) and Role:    Rozetta Nunnery, MD - Primary    * Ralene Ok, MD - Assisting  PHYSICIAN ASSISTANT:   ASSISTANTS: Ralene Ok, MD   ANESTHESIA:   general  EBL:  140 mL   BLOOD ADMINISTERED:none  DRAINS: (Medium large) Hemovact drain(s) in the right neck with  Suction Open   LOCAL MEDICATIONS USED:  XYLOCAINE with EPI 10 cc  SPECIMEN:  Source of Specimen:  Right and left thyroid, central neck and right neck nodes  DISPOSITION OF SPECIMEN:  PATHOLOGY  COUNTS:  YES  TOURNIQUET:  * No tourniquets in log *  DICTATION: .Other Dictation: Dictation Number [id number]  PLAN OF CARE: Admit to inpatient   PATIENT DISPOSITION:  PACU - hemodynamically stable.   Delay start of Pharmacological VTE agent (>24hrs) due to surgical blood loss or risk of bleeding: yes

## 2018-07-20 ENCOUNTER — Encounter: Payer: Self-pay | Admitting: Family Medicine

## 2018-07-20 ENCOUNTER — Other Ambulatory Visit: Payer: Self-pay

## 2018-07-20 ENCOUNTER — Encounter (HOSPITAL_COMMUNITY): Payer: Self-pay | Admitting: Otolaryngology

## 2018-07-20 LAB — GLUCOSE, CAPILLARY
Glucose-Capillary: 212 mg/dL — ABNORMAL HIGH (ref 70–99)
Glucose-Capillary: 173 mg/dL — ABNORMAL HIGH (ref 70–99)

## 2018-07-20 LAB — CALCIUM
Calcium: 8.9 mg/dL (ref 8.9–10.3)
Calcium: 8.6 mg/dL — ABNORMAL LOW (ref 8.9–10.3)

## 2018-07-20 NOTE — Progress Notes (Signed)
Inpatient Diabetes Program Recommendations  AACE/ADA: New Consensus Statement on Inpatient Glycemic Control (2015)  Target Ranges:  Prepandial:   less than 140 mg/dL      Peak postprandial:   less than 180 mg/dL (1-2 hours)      Critically ill patients:  140 - 180 mg/dL   Lab Results  Component Value Date   GLUCAP 197 (H) 07/19/2018   HGBA1C 8.1 (H) 07/17/2018    Review of Glycemic Control  Diabetes history: DM2 Outpatient Diabetes medications: Metformin 500 mg bid Current orders for Inpatient glycemic control: None  Inpatient Diabetes Program Recommendations:   -Glycemic control order set with sensitive correction tid + hs 0-5 units  Thank you, Bethena Roys E. Rajendra Spiller, RN, MSN, CDE  Diabetes Coordinator Inpatient Glycemic Control Team Team Pager [phone] (8am-5pm) 07/20/2018 2:00 PM

## 2018-07-20 NOTE — Progress Notes (Signed)
POD 1 AF VSS Patient doing well with minimal complaints. Hemovac output 40 cc Ca  8.9 Stable post op course. Neck exam with minimal swelling. No trouble swallowing Plan  To remove drain from neck and discharge tomorrow.

## 2018-07-20 NOTE — Progress Notes (Signed)
40 ML from compression drain

## 2018-07-20 NOTE — Progress Notes (Signed)
Patient blood glucose was 212, MD notified. Patient does not have order for Finger stick. Will continue to monitor patient.

## 2018-07-21 LAB — CALCIUM: Calcium: 7.9 mg/dL — ABNORMAL LOW (ref 8.9–10.3)

## 2018-07-21 LAB — GLUCOSE, CAPILLARY
Glucose-Capillary: 165 mg/dL — ABNORMAL HIGH (ref 70–99)
Glucose-Capillary: 168 mg/dL — ABNORMAL HIGH (ref 70–99)

## 2018-07-21 MED ORDER — CALCIUM CARBONATE-VITAMIN D 500-200 MG-UNIT PO TABS: 2.0000 | ORAL_TABLET | Freq: Two times a day (BID) | ORAL | 1 refills | Status: DC

## 2018-07-21 NOTE — Discharge Summary (Signed)
POD 2 AF VSS Minimal pain. No trouble with airway or swallowing Hemovac drain removed . Neck looks good without swelling. Ca this am was 7.9. Path pending Patient is doing well and discharged to home on Ca supplement He will follow up in my office next Tuesday Final DX: Papillary thyroid carcinoma.

## 2018-07-22 ENCOUNTER — Other Ambulatory Visit: Payer: Self-pay | Admitting: Family Medicine

## 2018-07-24 NOTE — Telephone Encounter (Signed)
Requested Prescriptions  Pending Prescriptions Disp Refills  . lisinopril (PRINIVIL,ZESTRIL) 5 MG tablet [Pharmacy Med Name: LISINOPRIL 5 MG TABLET] 90 tablet 0    Sig: TAKE 1 TABLET BY MOUTH EVERY DAY     Cardiovascular:  ACE Inhibitors Failed - 07/22/2018  9:04 AM      Failed - Last BP in normal range    BP Readings from Last 1 Encounters:  07/21/18 (!) 143/98         Passed - Cr in normal range and within 180 days    Creatinine, Ser  Date Value Ref Range Status  07/17/2018 1.07 0.61 - 1.24 mg/dL Final         Passed - K in normal range and within 180 days    Potassium  Date Value Ref Range Status  07/17/2018 4.2 3.5 - 5.1 mmol/L Final         Passed - Patient is not pregnant      Passed - Valid encounter within last 6 months    Recent Outpatient Visits          2 months ago Type 2 diabetes mellitus without complication, without long-term current use of insulin ([place])   [facility], Ranell Patrick, MD   5 months ago Essential hypertension   [facility], Ranell Patrick, MD   8 months ago Type 2 diabetes mellitus without complication, without long-term current use of insulin [facility])   [facility], Ranell Patrick, MD   11 months ago Hyperlipidemia, unspecified hyperlipidemia type   [facility], Ranell Patrick, MD   11 months ago Type 2 diabetes mellitus with hyperglycemia, without long-term current use of insulin [facility])   [facility], Ranell Patrick, MD

## 2018-07-27 ENCOUNTER — Ambulatory Visit (INDEPENDENT_AMBULATORY_CARE_PROVIDER_SITE_OTHER): Payer: BLUE CROSS/BLUE SHIELD | Admitting: Endocrinology

## 2018-07-27 ENCOUNTER — Encounter: Payer: Self-pay | Admitting: Endocrinology

## 2018-07-27 VITALS — BP 126/82 | HR 79 | Ht 76.0 in | Wt 257.4 lb

## 2018-07-27 DIAGNOSIS — C73 Malignant neoplasm of thyroid gland: Secondary | ICD-10-CM

## 2018-07-27 DIAGNOSIS — E039 Hypothyroidism, unspecified: Secondary | ICD-10-CM | POA: Insufficient documentation

## 2018-07-27 DIAGNOSIS — E890 Postprocedural hypothyroidism: Secondary | ICD-10-CM | POA: Diagnosis not present

## 2018-07-27 DIAGNOSIS — E213 Hyperparathyroidism, unspecified: Secondary | ICD-10-CM

## 2018-07-27 LAB — TSH: TSH: 31.12 u[IU]/mL — ABNORMAL HIGH (ref 0.35–4.50)

## 2018-07-27 LAB — VITAMIN D 25 HYDROXY (VIT D DEFICIENCY, FRACTURES): VITD: 18.53 ng/mL — ABNORMAL LOW (ref 30.00–100.00)

## 2018-07-27 LAB — T4, FREE: Free T4: 0.43 ng/dL — ABNORMAL LOW (ref 0.60–1.60)

## 2018-07-27 MED ORDER — LEVOTHYROXINE SODIUM 125 MCG PO TABS: 125.0000 ug | ORAL_TABLET | Freq: Every day | ORAL | 3 refills | Status: DC

## 2018-07-27 NOTE — Progress Notes (Signed)
Subjective:    Patient ID: Tyrone Lee, male    DOB: 11/20/1970, 48 y.o.   MRN: [id number]  HPI Pt was noted to have mild hypercalcemia and elev PTH, both in early 2019.  He had thyroidect and RND on 07/19/18.  He reports only slight pain at the ant neck.  No assoc muscle cramps.  He takes Ca++, 2 tabs-BID.  He does not take vit-D or thyroid supplement.   Past Medical History:  Diagnosis Date  . Acid reflux   . Diabetes mellitus without complication ([place])   . Hemorrhoids   . Hypertension     Past Surgical History:  Procedure Laterality Date  . RADICAL NECK DISSECTION Right 07/19/2018   Procedure: RIGHT NECK DISSECTION;  Surgeon: Rozetta Nunnery, MD;  Location: [facility];  Service: ENT;  Laterality: Right;  . THYROIDECTOMY Right 07/19/2018   Procedure: TOTAL THYROIDECTOMY AND RIGHT NECK DISSECTION;  Surgeon: Rozetta Nunnery, MD;  Location[facility] OR;  Service: ENT;  Laterality: Right;    Social History   Socioeconomic History  . Marital status: Married    Spouse name: Not on file  . Number of children: 2  . Years of education: Not on file  . Highest education level: Not on file  Occupational History  . Not on file  Social Needs  . Financial resource strain: Not on file  . Food insecurity:    Worry: Not on file    Inability: Not on file  . Transportation needs:    Medical: Not on file    Non-medical: Not on file  Tobacco Use  . Smoking status: Never Smoker  . Smokeless tobacco: Never Used  Substance and Sexual Activity  . Alcohol use: Yes    Comment: weekends, occasional  . Drug use: No  . Sexual activity: Not on file  Lifestyle  . Physical activity:    Days per week: Not on file    Minutes per session: Not on file  . Stress: Not on file  Relationships  . Social connections:    Talks on phone: Not on file    Gets together: Not on file    Attends religious service: Not on file    Active member of club or organization: Not on file    Attends meetings of  clubs or organizations: Not on file    Relationship status: Not on file  . Intimate partner violence:    Fear of current or ex partner: Not on file    Emotionally abused: Not on file    Physically abused: Not on file    Forced sexual activity: Not on file  Other Topics Concern  . Not on file  Social History Narrative  . Not on file    Current Outpatient Medications on File Prior to Visit  Medication Sig Dispense Refill  . ACCU-CHEK GUIDE test strip TEST UP TO FOUR TIMES A DAY AS DIRECTED (START WITH TESTING ONCE PER DAY) 400 each 1  . atorvastatin (LIPITOR) 20 MG tablet Take 1 tablet (20 mg total) by mouth daily at 6 PM. 90 tablet 1  . blood glucose meter kit and supplies Dispense based on patient and insurance preference. Use up to four times daily as directed. (FOR ICD-10 E10.9, E11.9). Once per day for now. 1 each 0  . calcium-vitamin D (OSCAL WITH D) 500-200 MG-UNIT tablet Take 2 tablets by mouth 2 (two) times daily. 100 tablet 1  . lisinopril (PRINIVIL,ZESTRIL) 5 MG tablet TAKE 1 TABLET  BY MOUTH EVERY DAY 90 tablet 0  . metFORMIN (GLUCOPHAGE) 500 MG tablet Take 1 tablet (500 mg total) by mouth 2 (two) times daily with a meal. 180 tablet 1  . omeprazole (PRILOSEC) 10 MG capsule Take 10 mg by mouth daily as needed (heartburn).      No current facility-administered medications on file prior to visit.     No Known Allergies  Family History  Problem Relation Age of Onset  . Alcoholism Mother   . Drug abuse Mother   . Alcoholism Father   . Lung cancer Maternal Grandfather   . Lung cancer Paternal Grandfather   . Colon cancer Neg Hx   . Stomach cancer Neg Hx   . Esophageal cancer Neg Hx   . Thyroid disease Neg Hx     BP 126/82 (BP Location: Right Arm, Patient Position: Sitting, Cuff Size: Large)   Pulse 79   Ht 6' 4" (1.93 m)   Wt 257 lb 6.4 oz (116.8 kg)   SpO2 96%   BMI 31.33 kg/m    Review of Systems denies depression, hair loss, sob, weight gain, memory loss,  constipation, numbness, blurry vision, cold intolerance, myalgias, dry skin, rhinorrhea, easy bruising, and syncope.       Objective:   Physical Exam VS: see vs page GEN: no distress HEAD: head: no deformity eyes: no periorbital swelling, no proptosis external nose and ears are normal mouth: no lesion seen NECK: healing scar is present.  I do not appreciate a nodule in the thyroid or elsewhere in the neck CHEST WALL: no deformity LUNGS: clear to auscultation CV: reg rate and rhythm, no murmur ABD: abdomen is soft, nontender.  no hepatosplenomegaly.  not distended.  no hernia MUSCULOSKELETAL: muscle bulk and strength are grossly normal.  no obvious joint swelling.  gait is normal and steady EXTEMITIES: no deformity.  no edema PULSES: no carotid bruit NEURO:  cn 2-12 grossly intact.   readily moves all 4's.  sensation is intact to touch on all 4's SKIN:  Normal texture and temperature.  No rash or suspicious lesion is visible.   NODES:  None palpable at the neck PSYCH: alert, well-oriented.  Does not appear anxious nor depressed.   PAPILLARY CARCINOMA, MULTIFOCAL, LARGEST TUMOR IS 3.2 CM. - EXTRATHYROIDAL EXTENSION IS PRESENT. - MARGINS OF RESECTION ARE NOT INVOLVED. - METASTATIC CARCINOMA PRESENT IN ONE OF ONE LYMPH NODE (1/1). - SEE ONCOLOGY TABLE. 2. Lymph node, biopsy, Inferior Right - METASTATIC CARCINOMA PRESENT IN ONE OF SEVEN LYMPH NODES (1/7). 3. Thyroid, lobectomy, Left and central neck - CHRONIC LYMPHOCYTIC THYROIDITIS. - NO CARCINOMA IDENTIFIED IN EIGHT LYMPH NODES (0/8). 4. Parathyroid gland, Questionable Right - HYPERCELLULAR PARATHYROID TISSUE, 1.5 GRAMS. 5. Lymph nodes, radical neck dissection - METASTATIC CARCINOMA PRESENT IN THREE OF FIFTEEN LYMPH NODES (3/15). - EXTRANODAL EXTENSION IS PRESENT.   Lab Results  Component Value Date   PTH 68 (H) 05/05/2018   PTH Comment 05/05/2018   CALCIUM 7.9 (L) 07/21/2018   I have reviewed outside records, and  summarized: Pt was noted to have PTC, and referred here.  He was noted to have hypercalcemia, and DM was also addressed.       Assessment & Plan:  PTC, new stage 1.  We discussed adjuvant RAI.  He agrees.  Hyperparathyroidism: prob better, as parathyroid tissue was in the surgical specimen.  Postsurgical hypothyroidism, new.   Patient Instructions  1. Blood tests are requested for you today.  We'll let you know about  the results.  2. When the thyroid is low enough, I'll request the radioactive iodine pill. 3. You should be by yourself for that day and 3 more.   4. Then you can start the thyroid hormone pill.  I have sent a prescription to your pharmacy.   5. You will go back to x-ray, to lie down in front of a camera a few days later.   6. Please come back for a follow-up appointment in 1 month later.

## 2018-07-27 NOTE — Patient Instructions (Addendum)
1. Blood tests are requested for you today.  We'll let you know about the results.  2. When the thyroid is low enough, I'll request the radioactive iodine pill. 3. You should be by yourself for that day and 3 more.   4. Then you can start the thyroid hormone pill.  I have sent a prescription to your pharmacy.   5. You will go back to x-ray, to lie down in front of a camera a few days later.   6. Please come back for a follow-up appointment in 1 month later.

## 2018-07-28 LAB — PTH, INTACT AND CALCIUM
Calcium: 9.4 mg/dL (ref 8.6–10.3)
PTH: 17 pg/mL (ref 14–64)

## 2018-07-30 ENCOUNTER — Encounter: Payer: Self-pay | Admitting: Endocrinology

## 2018-07-31 ENCOUNTER — Other Ambulatory Visit: Payer: Self-pay | Admitting: Endocrinology

## 2018-07-31 ENCOUNTER — Telehealth: Payer: Self-pay | Admitting: Endocrinology

## 2018-07-31 MED ORDER — CHOLECALCIFEROL 50 MCG (2000 UT) PO CAPS: 2000.0000 [IU] | ORAL_CAPSULE | Freq: Every day | ORAL | 3 refills | Status: DC

## 2018-07-31 NOTE — Telephone Encounter (Signed)
Per our conversation, pt was informed that you would not agree to extension d/t testing. Pt understood. Letter mailed

## 2018-07-31 NOTE — Telephone Encounter (Signed)
I need to know the reason for the extension, other than the appointment for the follow up scan.

## 2018-07-31 NOTE — Telephone Encounter (Signed)
Please advise. It appears this medication was being managed by Dr. Lucia Gaskins. Are you assuming management for this medication?

## 2018-07-31 NOTE — Telephone Encounter (Signed)
Please advise 

## 2018-07-31 NOTE — Telephone Encounter (Signed)
done

## 2018-07-31 NOTE — Telephone Encounter (Signed)
Needs an OK to Return to Work on 08/21/2018 note

## 2018-07-31 NOTE — Telephone Encounter (Signed)
Called pt to make him aware. Requested to extend to 08/20/18. Please advise. Also requesting it to be mailed to home address.

## 2018-08-07 ENCOUNTER — Ambulatory Visit (HOSPITAL_COMMUNITY)
Admission: RE | Admit: 2018-08-07 | Discharge: 2018-08-07 | Disposition: A | Discharge status: Home / Self Care | Payer: BLUE CROSS/BLUE SHIELD | Source: Ambulatory Visit | Attending: Endocrinology | Admitting: Endocrinology

## 2018-08-07 DIAGNOSIS — C73 Malignant neoplasm of thyroid gland: Secondary | ICD-10-CM | POA: Diagnosis not present

## 2018-08-07 MED ORDER — SODIUM IODIDE I 131 CAPSULE
127.1000 | Freq: Once | INTRAVENOUS | Status: AC | PRN
  Administered 2018-08-07: 127.1 via ORAL

## 2018-08-14 ENCOUNTER — Telehealth: Payer: Self-pay | Admitting: Endocrinology

## 2018-08-14 NOTE — Telephone Encounter (Signed)
[place] [facility] ph# [phone], ext# [phone] called re: whole body scan PA required by patient's insurance.

## 2018-08-15 NOTE — Telephone Encounter (Signed)
Have you completed or started this PA?

## 2018-08-17 ENCOUNTER — Ambulatory Visit (HOSPITAL_COMMUNITY)
Admission: RE | Admit: 2018-08-17 | Discharge: 2018-08-17 | Disposition: A | Discharge status: Home / Self Care | Payer: BLUE CROSS/BLUE SHIELD | Source: Ambulatory Visit | Attending: Endocrinology | Admitting: Endocrinology

## 2018-08-17 DIAGNOSIS — C73 Malignant neoplasm of thyroid gland: Secondary | ICD-10-CM | POA: Insufficient documentation

## 2018-08-17 DIAGNOSIS — Z85850 Personal history of malignant neoplasm of thyroid: Secondary | ICD-10-CM | POA: Diagnosis not present

## 2018-08-22 NOTE — Telephone Encounter (Signed)
Patient has had scan- this has been resolved

## 2018-09-13 ENCOUNTER — Encounter: Payer: Self-pay | Admitting: Endocrinology

## 2018-09-13 ENCOUNTER — Ambulatory Visit (INDEPENDENT_AMBULATORY_CARE_PROVIDER_SITE_OTHER): Payer: BLUE CROSS/BLUE SHIELD | Admitting: Endocrinology

## 2018-09-13 ENCOUNTER — Other Ambulatory Visit: Payer: Self-pay

## 2018-09-13 VITALS — BP 130/80 | HR 74 | Ht 76.0 in | Wt 259.0 lb

## 2018-09-13 DIAGNOSIS — E890 Postprocedural hypothyroidism: Secondary | ICD-10-CM | POA: Diagnosis not present

## 2018-09-13 DIAGNOSIS — C73 Malignant neoplasm of thyroid gland: Secondary | ICD-10-CM | POA: Diagnosis not present

## 2018-09-13 DIAGNOSIS — E213 Hyperparathyroidism, unspecified: Secondary | ICD-10-CM | POA: Diagnosis not present

## 2018-09-13 LAB — VITAMIN D 25 HYDROXY (VIT D DEFICIENCY, FRACTURES): VITD: 32.91 ng/mL (ref 30.00–100.00)

## 2018-09-13 LAB — T4, FREE: Free T4: 0.95 ng/dL (ref 0.60–1.60)

## 2018-09-13 LAB — TSH: TSH: 13.11 u[IU]/mL — ABNORMAL HIGH (ref 0.35–4.50)

## 2018-09-13 MED ORDER — LEVOTHYROXINE SODIUM 150 MCG PO TABS: 150.0000 ug | ORAL_TABLET | Freq: Every day | ORAL | 3 refills | Status: DC

## 2018-09-13 NOTE — Patient Instructions (Addendum)
Blood tests are requested for you today.  We'll let you know about the results.  Please come back for a follow-up appointment in 5 months.

## 2018-09-13 NOTE — Progress Notes (Signed)
Subjective:    Patient ID: Tyrone Lee, male    DOB: Nov 29, 1970, 48 y.o.   MRN: [id number]  HPI The state of at least three ongoing medical problems is addressed today, with interval history of each noted here: PTC, new stage 1: 1/20: thyroidectomy and RND: multifocal PTC, largest focus is 3.2 cm (T2N1) 2/20: RAI, 127 mCi 2/20: post-therapy scan: small focus in the neck He denies neck nodule.  This is a stable problem. Hyperparathyroidism (dx'ed 2019; parathyroid tissue was in the 1/20 surgical specimen).  Denies muscle cramps.  He takes vit-D, 2800 units/day.  This is a stable problem. Postsurgical hypothyroidism: he has dry skin.  This is a stable problem. Past Medical History:  Diagnosis Date  . Acid reflux   . Diabetes mellitus without complication ([place])   . Hemorrhoids   . Hypertension     Past Surgical History:  Procedure Laterality Date  . RADICAL NECK DISSECTION Right 07/19/2018   Procedure: RIGHT NECK DISSECTION;  Surgeon: Rozetta Nunnery, MD;  Location: [facility];  Service: ENT;  Laterality: Right;  . THYROIDECTOMY Right 07/19/2018   Procedure: TOTAL THYROIDECTOMY AND RIGHT NECK DISSECTION;  Surgeon: Rozetta Nunnery, MD;  Location[facility] OR;  Service: ENT;  Laterality: Right;    Social History   Socioeconomic History  . Marital status: Married    Spouse name: Not on file  . Number of children: 2  . Years of education: Not on file  . Highest education level: Not on file  Occupational History  . Not on file  Social Needs  . Financial resource strain: Not on file  . Food insecurity:    Worry: Not on file    Inability: Not on file  . Transportation needs:    Medical: Not on file    Non-medical: Not on file  Tobacco Use  . Smoking status: Never Smoker  . Smokeless tobacco: Never Used  Substance and Sexual Activity  . Alcohol use: Yes    Comment: weekends, occasional  . Drug use: No  . Sexual activity: Not on file  Lifestyle  . Physical activity:     Days per week: Not on file    Minutes per session: Not on file  . Stress: Not on file  Relationships  . Social connections:    Talks on phone: Not on file    Gets together: Not on file    Attends religious service: Not on file    Active member of club or organization: Not on file    Attends meetings of clubs or organizations: Not on file    Relationship status: Not on file  . Intimate partner violence:    Fear of current or ex partner: Not on file    Emotionally abused: Not on file    Physically abused: Not on file    Forced sexual activity: Not on file  Other Topics Concern  . Not on file  Social History Narrative  . Not on file    Current Outpatient Medications on File Prior to Visit  Medication Sig Dispense Refill  . ACCU-CHEK GUIDE test strip TEST UP TO FOUR TIMES A DAY AS DIRECTED (START WITH TESTING ONCE PER DAY) 400 each 1  . atorvastatin (LIPITOR) 20 MG tablet Take 1 tablet (20 mg total) by mouth daily at 6 PM. 90 tablet 1  . blood glucose meter kit and supplies Dispense based on patient and insurance preference. Use up to four times daily as directed. (FOR ICD-10  E10.9, E11.9). Once per day for now. 1 each 0  . calcium-vitamin D (OSCAL WITH D) 500-200 MG-UNIT tablet Take 2 tablets by mouth 2 (two) times daily. 100 tablet 1  . Cholecalciferol 50 MCG (2000 UT) CAPS Take 1 capsule (2,000 Units total) by mouth daily. 100 each 3  . lisinopril (PRINIVIL,ZESTRIL) 5 MG tablet TAKE 1 TABLET BY MOUTH EVERY DAY 90 tablet 0  . metFORMIN (GLUCOPHAGE) 500 MG tablet Take 1 tablet (500 mg total) by mouth 2 (two) times daily with a meal. 180 tablet 1  . omeprazole (PRILOSEC) 10 MG capsule Take 10 mg by mouth daily as needed (heartburn).      No current facility-administered medications on file prior to visit.     No Known Allergies  Family History  Problem Relation Age of Onset  . Alcoholism Mother   . Drug abuse Mother   . Alcoholism Father   . Lung cancer Maternal Grandfather    . Lung cancer Paternal Grandfather   . Colon cancer Neg Hx   . Stomach cancer Neg Hx   . Esophageal cancer Neg Hx   . Thyroid disease Neg Hx     BP 130/80 (BP Location: Left Arm, Patient Position: Sitting, Cuff Size: Normal)   Pulse 74   Ht 6' 4"  (1.93 m)   Wt 259 lb (117.5 kg)   SpO2 95%   BMI 31.53 kg/m    Review of Systems Denies neck swelling and pain.     Objective:   Physical Exam VITAL SIGNS:  See vs page GENERAL: no distress Neck: a healed scar is present.  I do not appreciate a nodule in the thyroid or elsewhere in the neck.        Assessment & Plan:  PTC: we discussed.  He wants to check TG, although RAI was recent Hyperparathyroidism: recheck today Postsurgical hypothyroidism: recheck today  Patient Instructions  Blood tests are requested for you today.  We'll let you know about the results.  Please come back for a follow-up appointment in 5 months.

## 2018-09-14 LAB — PTH, INTACT AND CALCIUM
CALCIUM: 8.9 mg/dL (ref 8.6–10.3)
PTH: 33 pg/mL (ref 14–64)

## 2018-09-14 LAB — THYROGLOBULIN ANTIBODY: Thyroglobulin Ab: 329 IU/mL — ABNORMAL HIGH (ref <=1)

## 2018-09-14 LAB — THYROGLOBULIN LEVEL: Thyroglobulin: <0.1 ng/mL — ABNORMAL LOW

## 2018-09-15 ENCOUNTER — Ambulatory Visit: Payer: BLUE CROSS/BLUE SHIELD | Admitting: Endocrinology

## 2018-10-16 ENCOUNTER — Other Ambulatory Visit: Payer: Self-pay | Admitting: Family Medicine

## 2018-10-20 ENCOUNTER — Other Ambulatory Visit: Payer: Self-pay

## 2018-10-20 ENCOUNTER — Other Ambulatory Visit: Payer: Self-pay | Admitting: Endocrinology

## 2018-10-20 ENCOUNTER — Other Ambulatory Visit (INDEPENDENT_AMBULATORY_CARE_PROVIDER_SITE_OTHER): Payer: BLUE CROSS/BLUE SHIELD

## 2018-10-20 DIAGNOSIS — E890 Postprocedural hypothyroidism: Secondary | ICD-10-CM | POA: Diagnosis not present

## 2018-10-20 LAB — TSH: TSH: 7.27 u[IU]/mL — ABNORMAL HIGH (ref 0.35–4.50)

## 2018-10-20 MED ORDER — LEVOTHYROXINE SODIUM 175 MCG PO TABS: 175.0000 ug | ORAL_TABLET | Freq: Every day | ORAL | 2 refills | Status: DC

## 2018-11-16 ENCOUNTER — Ambulatory Visit: Payer: BLUE CROSS/BLUE SHIELD | Admitting: Family Medicine

## 2018-11-30 ENCOUNTER — Other Ambulatory Visit (INDEPENDENT_AMBULATORY_CARE_PROVIDER_SITE_OTHER): Payer: BLUE CROSS/BLUE SHIELD

## 2018-11-30 ENCOUNTER — Other Ambulatory Visit: Payer: Self-pay

## 2018-11-30 DIAGNOSIS — E890 Postprocedural hypothyroidism: Secondary | ICD-10-CM

## 2018-12-01 ENCOUNTER — Other Ambulatory Visit: Payer: Self-pay | Admitting: Endocrinology

## 2018-12-01 LAB — TSH: TSH: 1.56 u[IU]/mL (ref 0.35–4.50)

## 2018-12-01 MED ORDER — LEVOTHYROXINE SODIUM 200 MCG PO TABS: 200.0000 ug | ORAL_TABLET | Freq: Every day | ORAL | 3 refills | Status: DC

## 2018-12-06 ENCOUNTER — Encounter: Payer: Self-pay | Admitting: Internal Medicine

## 2018-12-09 ENCOUNTER — Other Ambulatory Visit: Payer: Self-pay | Admitting: Endocrinology

## 2018-12-29 NOTE — Telephone Encounter (Signed)
Opened in error

## 2019-01-03 ENCOUNTER — Other Ambulatory Visit: Payer: Self-pay | Admitting: Family Medicine

## 2019-01-03 DIAGNOSIS — E1165 Type 2 diabetes mellitus with hyperglycemia: Secondary | ICD-10-CM

## 2019-01-08 ENCOUNTER — Other Ambulatory Visit: Payer: Self-pay | Admitting: Family Medicine

## 2019-01-08 NOTE — Telephone Encounter (Signed)
Requested medication (s) are due for refill today: yes  Requested medication (s) are on the active medication list: yes  Last refill:  10/16/2018  Future visit scheduled: no  Notes to clinic: no valid encounter within last 6 months    Requested Prescriptions  Pending Prescriptions Disp Refills   lisinopril (ZESTRIL) 5 MG tablet [Pharmacy Med Name: LISINOPRIL 5 MG TABLET] 90 tablet 0    Sig: TAKE 1 TABLET BY MOUTH EVERY DAY     Cardiovascular:  ACE Inhibitors Failed - 01/08/2019  1:21 AM      Failed - Valid encounter within last 6 months    Recent Outpatient Visits          8 months ago Type 2 diabetes mellitus without complication, without long-term current use of insulin ([place])   [facility] [place], MD   11 months ago Essential hypertension   [facility], Ran[facility] Patrick, MD   1 year ago Type 2 diabetes mellitus without complication, without long-term current use of insulin [facility])   [facility], Ranell Patrick, MD   1 year ago Hyperlipidemia, unspecified hyperlipidemia type   [facility], Ranell Patrick, MD   1 year ago Type 2 diabetes mellitus with hyperglycemia, without long-term current use of insulin [facility])   [facility], Ranell Patrick, MD             Passed - Cr in normal range and within 180 days    Creatinine, Ser  Date Value Ref Range Status  07/17/2018 1.07 0.61 - 1.24 mg/dL Final         Passed - K in normal range and within 180 days    Potassium  Date Value Ref Range Status  07/17/2018 4.2 3.5 - 5.1 mmol/L Final         Passed - Patient is not pregnant      Passed - Last BP in normal range    BP Readings from Last 1 Encounters:  09/13/18 130/80

## 2019-01-11 ENCOUNTER — Other Ambulatory Visit: Payer: Self-pay | Admitting: Family Medicine

## 2019-01-11 DIAGNOSIS — E785 Hyperlipidemia, unspecified: Secondary | ICD-10-CM

## 2019-01-11 NOTE — Telephone Encounter (Signed)
Forwarding medication refill to PCP for review. 

## 2019-01-11 NOTE — Telephone Encounter (Signed)
Refilled temporarily - overdue for labs - please schedule appt with me in next few weeks.

## 2019-01-25 ENCOUNTER — Other Ambulatory Visit: Payer: Self-pay | Admitting: Family Medicine

## 2019-01-25 DIAGNOSIS — E1165 Type 2 diabetes mellitus with hyperglycemia: Secondary | ICD-10-CM

## 2019-01-25 NOTE — Telephone Encounter (Signed)
Requested medications are due for refill today?  Yes  Requested medications are on the active medication list?  Yes  Last refill 01/03/2019  Future visit scheduled?  No  Notes to clinic  Contacted patient to connect him to [facility] to schedule follow up appointment.  He states he will call the office to schedule after work today.  Advised him that per his last refill- he needs an appointment before further refills will be given.  Requested Prescriptions  Pending Prescriptions Disp Refills   metFORMIN (GLUCOPHAGE) 500 MG tablet [Pharmacy Med Name: METFORMIN HCL 500 MG TABLET] 60 tablet 0    Sig: Take 1 tablet (500 mg total) by mouth 2 (two) times daily with a meal.     Endocrinology:  Diabetes - Biguanides Failed - 01/25/2019 11:49 AM      Failed - HBA1C is between 0 and 7.9 and within 180 days    Hgb A1c MFr Bld  Date Value Ref Range Status  07/17/2018 8.1 (H) 4.8 - 5.6 % Final    Comment:    (NOTE) Pre diabetes:          5.7%-6.4% Diabetes:              >6.4% Glycemic control for   <7.0% adults with diabetes          Failed - Valid encounter within last 6 months    Recent Outpatient Visits          8 months ago Type 2 diabetes mellitus without complication, without long-term current use of insulin ([place])   [facility], Ranell Patrick, MD   11 months ago Essential hypertension   [facility], Ranell Patrick, MD   1 year ago Type 2 diabetes mellitus without complication, without long-term current use of insulin [facility])   [facility], Ranell Patrick, MD   1 year ago Hyperlipidemia, unspecified hyperlipidemia type   [facility], Ranell Patrick, MD   1 year ago Type 2 diabetes mellitus with hyperglycemia, without long-term current use of insulin [facility])   [facility], Ranell Patrick, MD             Passed - Cr in normal range and within 360 days    Creatinine, Ser  Date Value Ref Range Status  07/17/2018 1.07 0.61 -  1.24 mg/dL Final         Passed - eGFR in normal range and within 360 days    GFR calc Af Amer  Date Value Ref Range Status  07/17/2018 >60 >60 mL/min Final   GFR calc non Af Amer  Date Value Ref Range Status  07/17/2018 >60 >60 mL/min Final

## 2019-01-26 NOTE — Telephone Encounter (Signed)
Refilled medication, patient has an OV on 02/01/19.

## 2019-01-30 ENCOUNTER — Other Ambulatory Visit: Payer: Self-pay | Admitting: Family Medicine

## 2019-02-01 ENCOUNTER — Encounter: Payer: Self-pay | Admitting: Family Medicine

## 2019-02-01 ENCOUNTER — Other Ambulatory Visit: Payer: Self-pay

## 2019-02-01 ENCOUNTER — Ambulatory Visit (INDEPENDENT_AMBULATORY_CARE_PROVIDER_SITE_OTHER): Payer: BC Managed Care – PPO | Admitting: Family Medicine

## 2019-02-01 VITALS — BP 138/88 | HR 70 | Temp 98.1°F | Resp 16 | Wt 256.4 lb

## 2019-02-01 DIAGNOSIS — I10 Essential (primary) hypertension: Secondary | ICD-10-CM

## 2019-02-01 DIAGNOSIS — E119 Type 2 diabetes mellitus without complications: Secondary | ICD-10-CM | POA: Diagnosis not present

## 2019-02-01 DIAGNOSIS — E785 Hyperlipidemia, unspecified: Secondary | ICD-10-CM | POA: Diagnosis not present

## 2019-02-01 DIAGNOSIS — Z23 Encounter for immunization: Secondary | ICD-10-CM | POA: Diagnosis not present

## 2019-02-01 MED ORDER — LISINOPRIL 5 MG PO TABS: 5.0000 mg | ORAL_TABLET | Freq: Every day | ORAL | 1 refills | Status: DC

## 2019-02-01 NOTE — Progress Notes (Signed)
Subjective:     Patient ID: Tyrone Lee, male    DOB: 23-Oct-1970, 48 y.o.   MRN: [id number]  HPI Tyrone Lee is a 48 y.o. male Presents today for: Chief Complaint  Patient presents with  . Diabetes    Patient is here forf/u for diabetes and medication refill. Patient last seen on 05/05/2018. At last visit he was informed to increase lipitor to 2 per day. His A1C was still elecated and recommended to change dose of metformin. Blood sugar today was 130 this am. Patient stated he has not increase the lipitor just yet.    Hyperlipidemia:  Lab Results  Component Value Date   CHOL 180 05/05/2018   HDL 40 05/05/2018   LDLCALC 117 (H) 05/05/2018   TRIG 115 05/05/2018   CHOLHDL 4.5 05/05/2018   Lab Results  Component Value Date   ALT 28 05/05/2018   AST 15 05/05/2018   ALKPHOS 73 05/05/2018   BILITOT 0.9 05/05/2018  Lipitor 20 mg daily had been prescribed, but only on 67m for now - daily. Would like to see results first before changes.   Diabetes: Metformin 500 mg twice daily, no side effects. No GI intolerance.  Microalbumin: Normal urine microalbumin January 2019 Optho, foot exam, pneumovax: foot exam declined today.  Up-to-date on Pneumovax. Plans on scheduling optho exam.   Lab Results  Component Value Date   HGBA1C 8.1 (H) 07/17/2018   HGBA1C 7.2 (H) 05/05/2018   HGBA1C 7.2 (H) 02/02/2018   Lab Results  Component Value Date   LDLCALC 117 (H) 05/05/2018   CREATININE 1.07 07/17/2018  Borderline control in November, elevated A1c in January. Currently on metformin 500 mg twice daily.  He is on ACE inhibitor with lisinopril 5 mg daily, and statin as above.  Fasting reading 130 this am, lowest fasting - 120, highest 150.  Postprandial 180- 200.   tdap updated.    Patient Active Problem List   Diagnosis Date Noted  . [place] 07/27/2018  . Hypothyroidism 07/27/2018  . Thyroid cancer ([facility] 07/19/2018   Past Medical History:  Diagnosis  Date  . Acid reflux   . Diabetes mellitus without complication ([place]   . Hemorrhoids   . Hypertension    Past Surgical History:  Procedure Laterality Date  . RADICAL NECK DISSECTION Right 07/19/2018   Procedure: RIGHT NECK DISSECTION;  Surgeon: NRozetta Nunnery MD;  [place] [facility]  Service: E[place];  Laterality: Right;  . THYROIDECTOMY Right 07/19/2018   Procedure: TOTAL THYROIDECTOMY AND RIGHT NECK DISSECTION;  Surgeon: NRozetta Nunnery MD;  [place] [facility]  Service: ENT;  Laterality: Right;   No Known Allergies Prior to Admission medications   Medication Sig Start Date End Date Taking? Authorizing Provider  ACCU-CHEK GUIDE test strip TEST UP TO FOUR TIMES A DAY AS DIRECTED (START WITH TESTING ONCE PER DAY) 04/21/18  Yes GWendie Agreste MD  atorvastatin (LIPITOR) 20 MG tablet Take 1 tablet (20 mg total) by mouth daily at 6 PM. 01/11/19  Yes GWendie Agreste MD  blood glucose meter kit and supplies Dispense based on patient and insurance preference. Use up to four times daily as directed. (FOR ICD-10 E10.9, E11.9). Once per day for now. 08/04/17  Yes GCarlota Raspberry JRanell Patrick MD  calcium-vitamin D (OSCAL WITH D) 500-200 MG-UNIT tablet Take 2 tablets by mouth 2 (two) times daily. 07/21/18  Yes NRozetta Nunnery MD  Cholecalciferol 50 MCG (2000 UT) CAPS Take 1 capsule (2,000 Units total)  by mouth daily. 07/31/18  Yes Renato Shin, MD  levothyroxine (SYNTHROID) 200 MCG tablet Take 1 tablet (200 mcg total) by mouth daily before breakfast. 12/01/18  Yes Renato Shin, MD  lisinopril (ZESTRIL) 5 MG tablet TAKE 1 TABLET BY MOUTH EVERY DAY 01/30/19  Yes Wendie Agreste, MD  metFORMIN (GLUCOPHAGE) 500 MG tablet TAKE 1 TABLET (500 MG TOTAL) BY MOUTH 2 (TWO) TIMES DAILY WITH A MEAL. 01/26/19  Yes Wendie Agreste, MD  omeprazole (PRILOSEC) 10 MG capsule Take 10 mg by mouth daily as needed (heartburn).    Yes [provider]   Social History   Socioeconomic History  . Marital  status: Married    Spouse name: Not on file  . Number of children: 2  . Years of education: Not on file  . Highest education level: Not on file  Occupational History  . Not on file  Social Needs  . Financial resource strain: Not on file  . Food insecurity    Worry: Not on file    Inability: Not on file  . Transportation needs    Medical: Not on file    Non-medical: Not on file  Tobacco Use  . Smoking status: Never Smoker  . Smokeless tobacco: Never Used  Substance and Sexual Activity  . Alcohol use: Yes    Comment: weekends, occasional  . Drug use: No  . Sexual activity: Not on file  Lifestyle  . Physical activity    Days per week: Not on file    Minutes per session: Not on file  . Stress: Not on file  Relationships  . Social Herbalist on phone: Not on file    Gets together: Not on file    Attends religious service: Not on file    Active member of club or organization: Not on file    Attends meetings of clubs or organizations: Not on file    Relationship status: Not on file  . Intimate partner violence    Fear of current or ex partner: Not on file    Emotionally abused: Not on file    Physically abused: Not on file    Forced sexual activity: Not on file  Other Topics Concern  . Not on file  Social History Narrative  . Not on file    Review of Systems  Constitutional: Negative for fatigue and unexpected weight change.  Eyes: Negative for visual disturbance.  Respiratory: Negative for cough, chest tightness and shortness of breath.   Cardiovascular: Negative for chest pain, palpitations and leg swelling.  Gastrointestinal: Negative for abdominal pain and blood in stool.  Neurological: Negative for dizziness, light-headedness and headaches.       Objective:   Physical Exam Vitals signs reviewed.  Constitutional:      Appearance: He is well-developed.  HENT:     Head: Normocephalic and atraumatic.  Eyes:     Pupils: Pupils are equal, round, and  reactive to light.  Neck:     Vascular: No carotid bruit or JVD.  Cardiovascular:     Rate and Rhythm: Normal rate and regular rhythm.     Heart sounds: Normal heart sounds. No murmur.  Pulmonary:     Effort: Pulmonary effort is normal.     Breath sounds: Normal breath sounds. No rales.  Skin:    General: Skin is warm and dry.  Neurological:     Mental Status: He is alert and oriented to person, place, and time.  Vitals:   02/01/19 1544 02/01/19 1550  BP: (!) 174/94 138/88  Pulse: 70   Resp: 16   Temp: 98.1 F (36.7 C)   TempSrc: Oral   SpO2: 98%   Weight: 256 lb 6.4 oz (116.3 kg)           Assessment & Plan:    CASTER FAYETTE is a 48 y.o. male Hyperlipidemia, unspecified hyperlipidemia type - Plan: Lipid Panel  -Plan for increased to 20 mg Lipitor if elevated LDL over 70.  Has prescription at home.  Type 2 diabetes mellitus without complication, without long-term current use of insulin (HCC) - Plan: Hemoglobin A1c, Comprehensive metabolic panel, Microalbumin / creatinine urine ratio  -Decreased control by last A1c.  Anticipate increase metformin dose, will wait on labs first and then send new prescription.  Potential side effects, gastrointestinal side effects discussed.  38-monthfollow-up.  Essential hypertension - Plan: Comprehensive metabolic panel, lisinopril (ZESTRIL) 5 MG tablet  -Borderline on initial reading, stable on recheck.  Monitor home readings, RTC precautions if over 140/90.  Need for prophylactic vaccination with combined diphtheria-tetanus-pertussis (DTP) vaccine - Plan: Tdap vaccine greater than or equal to 7yo IM given   Meds ordered this encounter  Medications  . lisinopril (ZESTRIL) 5 MG tablet    Sig: Take 1 tablet (5 mg total) by mouth daily.    Dispense:  90 tablet    Refill:  1   Patient Instructions     Okay to remain on lower dose of Lipitor for now, but if your LDL cholesterol is above 70,  I would recommend going ahead and  starting the 20 mg dose with repeat testing at next visit.  I suspect you need a higher dose of metformin but will watch for the A1c results first and then can send in new prescription.  Continue same dose of lisinopril for now but if blood pressure is over 140/90 outside of the office, follow-up to discuss medication changes.  Recheck in 3 months.  Once A1c is better controlled, can space visits to 6 months    Type 2 Diabetes Mellitus, Self Care, Adult When you have type 2 diabetes (type 2 diabetes mellitus), you must make sure your blood sugar (glucose) stays in a healthy range. You can do this with:  Nutrition.  Exercise.  Lifestyle changes.  Medicines or insulin, if needed.  Support from your doctors and others. How to stay aware of blood sugar   Check your blood sugar level every day, as often as told.  Have your A1c (hemoglobin A1c) level checked two or more times a year. Have it checked more often if your doctor tells you to. Your doctor will set personal treatment goals for you. Generally, you should have these blood sugar levels:  Before meals (preprandial): 80-130 mg/dL (4.4-7.2 mmol/L).  After meals (postprandial): below 180 mg/dL (10 mmol/L).  A1c level: less than 7%. How to manage high and low blood sugar Signs of high blood sugar High blood sugar is called hyperglycemia. Know the signs of high blood sugar. Signs may include:  Feeling: ? Thirsty. ? Hungry. ? Very tired.  Needing to pee (urinate) more than usual.  Blurry vision. Signs of low blood sugar Low blood sugar is called hypoglycemia. This is when blood sugar is at or below 70 mg/dL (3.9 mmol/L). Signs may include:  Feeling: ? Hungry. ? Worried or nervous (anxious). ? Sweaty and clammy. ? Confused. ? Dizzy. ? Sleepy. ? Sick to your stomach (nauseous).  Having: ? A fast heartbeat. ? A headache. ? A change in your vision. ? Jerky movements that you cannot control (seizure). ? Tingling or  no feeling (numbness) around your mouth, lips, or tongue.  Having trouble with: ? Moving (coordination). ? Sleeping. ? Passing out (fainting). ? Getting upset easily (irritability). Treating low blood sugar To treat low blood sugar, eat or drink something sugary right away. If you can think clearly and swallow safely, follow the 15:15 rule:  Take 15 grams of a fast-acting carb (carbohydrate). Talk with your doctor about how much you should take.  Some fast-acting carbs are: ? Sugar tablets (glucose pills). Take 3-4 pills. ? 6-8 pieces of hard candy. ? 4-6 oz (120-150 mL) of fruit juice. ? 4-6 oz (120-150 mL) of regular (not diet) soda. ? 1 Tbsp (15 mL) honey or sugar.  Check your blood sugar 15 minutes after you take the carb.  If your blood sugar is still at or below 70 mg/dL (3.9 mmol/L), take 15 grams of a carb again.  If your blood sugar does not go above 70 mg/dL (3.9 mmol/L) after 3 tries, get help right away.  After your blood sugar goes back to normal, eat a meal or a snack within 1 hour. Treating very low blood sugar If your blood sugar is at or below 54 mg/dL (3 mmol/L), you have very low blood sugar (severe hypoglycemia). This is an emergency. Do not wait to see if the symptoms will go away. Get medical help right away. Call your local emergency services (911 in the [place] If you have very low blood sugar and you cannot eat or drink, you may need a glucagon shot (injection). A family member or friend should learn how to check your blood sugar and how to give you a glucagon shot. Ask your doctor if you need to have a glucagon shot kit at home. Follow these instructions at home: Medicine  Take insulin and diabetes medicines as told.  If your doctor says you should take more or less insulin and medicines, do this exactly as told.  Do not run out of insulin or medicines. Having diabetes can raise your risk for other long-term conditions. These include heart disease and  kidney disease. Your doctor may prescribe medicines to help you not have these problems. Food   Make healthy food choices. These include: ? Chicken, fish, egg whites, and beans. ? Oats, whole wheat, bulgur, brown rice, quinoa, and millet. ? Fresh fruits and vegetables. ? Low-fat dairy products. ? Nuts, avocado, olive oil, and canola oil.  Meet with a food specialist (dietitian). He or she can help you make an eating plan that is right for you.  Follow instructions from your doctor about what you cannot eat or drink.  Drink enough fluid to keep your pee (urine) pale yellow.  Keep track of carbs that you eat. Do this by reading food labels and learning food serving sizes.  Follow your sick day plan when you cannot eat or drink normally. Make this plan with your doctor so it is ready to use. Activity  Exercise 3 or more times a week.  Do not go more than 2 days without exercising.  Talk with your doctor before you start a new exercise. Your doctor may need to tell you to change: ? How much insulin or medicines you take. ? How much food you eat. Lifestyle  Do not use any tobacco products. These include cigarettes, chewing tobacco, and e-cigarettes. If you  need help quitting, ask your doctor.  Ask your doctor how much alcohol is safe for you.  Learn to deal with stress. If you need help with this, ask your doctor. Body care   Stay up to date with your shots (immunizations).  Have your eyes and feet checked by a doctor as often as told.  Check your skin and feet every day. Check for cuts, bruises, redness, blisters, or sores.  Brush your teeth and gums two times a day. Floss one or more times a day.  Go to the dentist one or more times every 6 months.  Stay at a healthy weight. General instructions  Take over-the-counter and prescription medicines only as told by your doctor.  Share your diabetes care plan with: ? Your work or school. ? People you live with.  Carry  a card or wear jewelry that says you have diabetes.  Keep all follow-up visits as told by your doctor. This is important. Questions to ask your doctor  Do I need to meet with a diabetes educator?  Where can I find a support group for people with diabetes? Where to find more information To learn more about diabetes, visit:  American Diabetes Association: www.diabetes.org  American Association of Diabetes Educators: www.diabeteseducator.org Summary  When you have type 2 diabetes, you must make sure your blood sugar (glucose) stays in a healthy range.  Check your blood sugar every day, as often as told.  Having diabetes can raise your risk for other conditions. Your doctor may prescribe medicines to help you not have these problems.  Keep all follow-up visits as told by your doctor. This is important. This information is not intended to replace advice given to you by your health care provider. Make sure you discuss any questions you have with your health care provider. Document Released: 10/13/2015 Document Revised: 12/12/2017 Document Reviewed: 07/25/2015 Elsevier Patient Education  [employer].   If you have lab work done today you will be contacted with your lab results within the next 2 weeks.  If you have not heard from [place] then please contact us. The fastest way to get your results is to register for My Chart.   IF you received an x-ray today, you will receive an invoice from [facility]. Please contact [facility] at [phone] with questions or concerns regarding your invoice.   IF you received labwork today, you will receive an invoice from [place]. Please contact [facility] at [phone] with questions or concerns regarding your invoice.   Our billing staff will not be able to assist you with questions regarding bills from these companies.  You will be contacted with the lab results as soon as they are available. The fastest way to get your  results is to activate your My Chart account. Instructions are located on the last page of this paperwork. If you have not heard from [place] regarding the results in 2 weeks, please contact this office.       Signed,   Merri Ray, MD [facility] [place].  02/01/19 6:58 PM

## 2019-02-01 NOTE — Patient Instructions (Addendum)
Okay to remain on lower dose of Lipitor for now, but if your LDL cholesterol is above 70,  I would recommend going ahead and starting the 20 mg dose with repeat testing at next visit.  I suspect you need a higher dose of metformin but will watch for the A1c results first and then can send in new prescription.  Continue same dose of lisinopril for now but if blood pressure is over 140/90 outside of the office, follow-up to discuss medication changes.  Recheck in 3 months.  Once A1c is better controlled, can space visits to 6 months    Type 2 Diabetes Mellitus, Self Care, Adult When you have type 2 diabetes (type 2 diabetes mellitus), you must make sure your blood sugar (glucose) stays in a healthy range. You can do this with:  Nutrition.  Exercise.  Lifestyle changes.  Medicines or insulin, if needed.  Support from your doctors and others. How to stay aware of blood sugar   Check your blood sugar level every day, as often as told.  Have your A1c (hemoglobin A1c) level checked two or more times a year. Have it checked more often if your doctor tells you to. Your doctor will set personal treatment goals for you. Generally, you should have these blood sugar levels:  Before meals (preprandial): 80-130 mg/dL (4.4-7.2 mmol/L).  After meals (postprandial): below 180 mg/dL (10 mmol/L).  A1c level: less than 7%. How to manage high and low blood sugar Signs of high blood sugar High blood sugar is called hyperglycemia. Know the signs of high blood sugar. Signs may include:  Feeling: ? Thirsty. ? Hungry. ? Very tired.  Needing to pee (urinate) more than usual.  Blurry vision. Signs of low blood sugar Low blood sugar is called hypoglycemia. This is when blood sugar is at or below 70 mg/dL (3.9 mmol/L). Signs may include:  Feeling: ? Hungry. ? Worried or nervous (anxious). ? Sweaty and clammy. ? Confused. ? Dizzy. ? Sleepy. ? Sick to your stomach  (nauseous).  Having: ? A fast heartbeat. ? A headache. ? A change in your vision. ? Jerky movements that you cannot control (seizure). ? Tingling or no feeling (numbness) around your mouth, lips, or tongue.  Having trouble with: ? Moving (coordination). ? Sleeping. ? Passing out (fainting). ? Getting upset easily (irritability). Treating low blood sugar To treat low blood sugar, eat or drink something sugary right away. If you can think clearly and swallow safely, follow the 15:15 rule:  Take 15 grams of a fast-acting carb (carbohydrate). Talk with your doctor about how much you should take.  Some fast-acting carbs are: ? Sugar tablets (glucose pills). Take 3-4 pills. ? 6-8 pieces of hard candy. ? 4-6 oz (120-150 mL) of fruit juice. ? 4-6 oz (120-150 mL) of regular (not diet) soda. ? 1 Tbsp (15 mL) honey or sugar.  Check your blood sugar 15 minutes after you take the carb.  If your blood sugar is still at or below 70 mg/dL (3.9 mmol/L), take 15 grams of a carb again.  If your blood sugar does not go above 70 mg/dL (3.9 mmol/L) after 3 tries, get help right away.  After your blood sugar goes back to normal, eat a meal or a snack within 1 hour. Treating very low blood sugar If your blood sugar is at or below 54 mg/dL (3 mmol/L), you have very low blood sugar (severe hypoglycemia). This is an emergency. Do not wait to see if the  symptoms will go away. Get medical help right away. Call your local emergency services (911 in the [place] If you have very low blood sugar and you cannot eat or drink, you may need a glucagon shot (injection). A family member or friend should learn how to check your blood sugar and how to give you a glucagon shot. Ask your doctor if you need to have a glucagon shot kit at home. Follow these instructions at home: Medicine  Take insulin and diabetes medicines as told.  If your doctor says you should take more or less insulin and medicines, do this exactly  as told.  Do not run out of insulin or medicines. Having diabetes can raise your risk for other long-term conditions. These include heart disease and kidney disease. Your doctor may prescribe medicines to help you not have these problems. Food   Make healthy food choices. These include: ? Chicken, fish, egg whites, and beans. ? Oats, whole wheat, bulgur, brown rice, quinoa, and millet. ? Fresh fruits and vegetables. ? Low-fat dairy products. ? Nuts, avocado, olive oil, and canola oil.  Meet with a food specialist (dietitian). He or she can help you make an eating plan that is right for you.  Follow instructions from your doctor about what you cannot eat or drink.  Drink enough fluid to keep your pee (urine) pale yellow.  Keep track of carbs that you eat. Do this by reading food labels and learning food serving sizes.  Follow your sick day plan when you cannot eat or drink normally. Make this plan with your doctor so it is ready to use. Activity  Exercise 3 or more times a week.  Do not go more than 2 days without exercising.  Talk with your doctor before you start a new exercise. Your doctor may need to tell you to change: ? How much insulin or medicines you take. ? How much food you eat. Lifestyle  Do not use any tobacco products. These include cigarettes, chewing tobacco, and e-cigarettes. If you need help quitting, ask your doctor.  Ask your doctor how much alcohol is safe for you.  Learn to deal with stress. If you need help with this, ask your doctor. Body care   Stay up to date with your shots (immunizations).  Have your eyes and feet checked by a doctor as often as told.  Check your skin and feet every day. Check for cuts, bruises, redness, blisters, or sores.  Brush your teeth and gums two times a day. Floss one or more times a day.  Go to the dentist one or more times every 6 months.  Stay at a healthy weight. General instructions  Take over-the-counter  and prescription medicines only as told by your doctor.  Share your diabetes care plan with: ? Your work or school. ? People you live with.  Carry a card or wear jewelry that says you have diabetes.  Keep all follow-up visits as told by your doctor. This is important. Questions to ask your doctor  Do I need to meet with a diabetes educator?  Where can I find a support group for people with diabetes? Where to find more information To learn more about diabetes, visit:  American Diabetes Association: www.diabetes.org  American Association of Diabetes Educators: www.diabeteseducator.org Summary  When you have type 2 diabetes, you must make sure your blood sugar (glucose) stays in a healthy range.  Check your blood sugar every day, as often as told.  Having diabetes can  raise your risk for other conditions. Your doctor may prescribe medicines to help you not have these problems.  Keep all follow-up visits as told by your doctor. This is important. This information is not intended to replace advice given to you by your health care provider. Make sure you discuss any questions you have with your health care provider. Document Released: 10/13/2015 Document Revised: 12/12/2017 Document Reviewed: 07/25/2015 Elsevier Patient Education  [employer].   If you have lab work done today you will be contacted with your lab results within the next 2 weeks.  If you have not heard from [place] then please contact us. The fastest way to get your results is to register for My Chart.   IF you received an x-ray today, you will receive an invoice from [facility]. Please contact [facility] Radiology at [phone] with questions or concerns regarding your invoice.   IF you received labwork today, you will receive an invoice from [place]. Please contact [facility] at [phone] with questions or concerns regarding your invoice.   Our billing staff will not be able to assist you with  questions regarding bills from these companies.  You will be contacted with the lab results as soon as they are available. The fastest way to get your results is to activate your My Chart account. Instructions are located on the last page of this paperwork. If you have not heard from [place] regarding the results in 2 weeks, please contact this office.

## 2019-02-02 LAB — LIPID PANEL
Chol/HDL Ratio: 4.9 ratio (ref 0.0–5.0)
Cholesterol, Total: 194 mg/dL (ref 100–199)
HDL: 40 mg/dL (ref >39)
LDL Calculated: 118 mg/dL — ABNORMAL HIGH (ref 0–99)
Triglycerides: 182 mg/dL — ABNORMAL HIGH (ref 0–149)
VLDL Cholesterol Cal: 36 mg/dL (ref 5–40)

## 2019-02-02 LAB — COMPREHENSIVE METABOLIC PANEL
ALT: 36 IU/L (ref 0–44)
AST: 19 IU/L (ref 0–40)
Albumin/Globulin Ratio: 2.1 (ref 1.2–2.2)
Albumin: 4.9 g/dL (ref 4.0–5.0)
Alkaline Phosphatase: 61 IU/L (ref 39–117)
BUN/Creatinine Ratio: 11 (ref 9–20)
BUN: 11 mg/dL (ref 6–24)
Bilirubin Total: 1.1 mg/dL (ref 0.0–1.2)
CO2: 23 mmol/L (ref 20–29)
Calcium: 9.8 mg/dL (ref 8.7–10.2)
Chloride: 102 mmol/L (ref 96–106)
Creatinine, Ser: 1.02 mg/dL (ref 0.76–1.27)
GFR calc Af Amer: 99 mL/min/{1.73_m2} (ref >59)
GFR calc non Af Amer: 85 mL/min/{1.73_m2} (ref >59)
Globulin, Total: 2.3 g/dL (ref 1.5–4.5)
Glucose: 119 mg/dL — ABNORMAL HIGH (ref 65–99)
Potassium: 4 mmol/L (ref 3.5–5.2)
Sodium: 142 mmol/L (ref 134–144)
Total Protein: 7.2 g/dL (ref 6.0–8.5)

## 2019-02-02 LAB — MICROALBUMIN / CREATININE URINE RATIO
Creatinine, Urine: 91 mg/dL
Microalb/Creat Ratio: 9 mg/g creat (ref 0–29)
Microalbumin, Urine: 8.3 ug/mL

## 2019-02-02 LAB — HEMOGLOBIN A1C
Est. average glucose Bld gHb Est-mCnc: 197 mg/dL
Hgb A1c MFr Bld: 8.5 % — ABNORMAL HIGH (ref 4.8–5.6)

## 2019-02-04 ENCOUNTER — Other Ambulatory Visit: Payer: Self-pay | Admitting: Family Medicine

## 2019-02-04 DIAGNOSIS — E1165 Type 2 diabetes mellitus with hyperglycemia: Secondary | ICD-10-CM

## 2019-02-04 MED ORDER — METFORMIN HCL 1000 MG PO TABS: 1000.0000 mg | ORAL_TABLET | Freq: Two times a day (BID) | ORAL | 1 refills | Status: DC

## 2019-02-04 NOTE — Progress Notes (Signed)
See labs - increasing dose of metformin.

## 2019-02-21 ENCOUNTER — Other Ambulatory Visit: Payer: Self-pay

## 2019-02-23 ENCOUNTER — Other Ambulatory Visit: Payer: Self-pay

## 2019-02-23 ENCOUNTER — Encounter: Payer: Self-pay | Admitting: Endocrinology

## 2019-02-23 ENCOUNTER — Ambulatory Visit (INDEPENDENT_AMBULATORY_CARE_PROVIDER_SITE_OTHER): Payer: BC Managed Care – PPO | Admitting: Endocrinology

## 2019-02-23 VITALS — BP 142/98 | HR 90 | Ht 76.0 in | Wt 259.6 lb

## 2019-02-23 DIAGNOSIS — E213 Hyperparathyroidism, unspecified: Secondary | ICD-10-CM

## 2019-02-23 DIAGNOSIS — C73 Malignant neoplasm of thyroid gland: Secondary | ICD-10-CM | POA: Diagnosis not present

## 2019-02-23 DIAGNOSIS — E890 Postprocedural hypothyroidism: Secondary | ICD-10-CM | POA: Diagnosis not present

## 2019-02-23 NOTE — Patient Instructions (Addendum)
Your blood pressure is high today.  Please see your primary care provider soon, to have it rechecked  Blood tests are requested for you today.  We'll let you know about the results.  Desired results are: Vit-D, calcium and parathyroid: all normal TSH: slightly low (refleacting slightly "too much" medication," to reduce the chances of the cancer coming back. TG: undetectable Anti-TG antibody: lower (undetectable would be better, but is unlikely this soon)  Please stop the combination calcium and vitamin-D pill.   Please come back for a follow-up appointment in 6 months.

## 2019-02-23 NOTE — Progress Notes (Signed)
Subjective:    Patient ID: Tyrone Lee, male    DOB: March 18, 1971, 48 y.o.   MRN: [id number]  HPI The state of at least three ongoing medical problems is addressed today, with interval history of each noted here: PTC,stage 1, with this chronology.  1/20: thyroidectomy and RND: multifocal PTC, largest focus is 3.2 cm (T2N1).   2/20: RAI, 127 mCi 2/20: post-therapy scan: small focus in the neck He denies neck nodule.  This is a stable problem. Hyperparathyroidism (dx'ed 2019; parathyroid tissue was in the 1/20 surgical specimen).  Denies muscle cramps.  He takes vit-D, 2800 units/day.  This is a stable problem.   Postsurgical hypothyroidism: denies dry skin.  This is a stable problem.    Past Medical History:  Diagnosis Date  . Acid reflux   . Diabetes mellitus without complication ([place])   . Hemorrhoids   . Hypertension     Past Surgical History:  Procedure Laterality Date  . RADICAL NECK DISSECTION Right 07/19/2018   Procedure: RIGHT NECK DISSECTION;  Surgeon: Rozetta Nunnery, MD;  Location: [place];  Service: ENT;  Laterality: Right;  . THYROIDECTOMY Right 07/19/2018   Procedure: TOTAL THYROIDECTOMY AND RIGHT NECK DISSECTION;  Surgeon: Rozetta Nunnery, MD;  Location: [facility];  Service: ENT;  Laterality: Right;    Social History   Socioeconomic History  . Marital status: Married    Spouse name: Not on file  . Number of children: 2  . Years of education: Not on file  . Highest education level: Not on file  Occupational History  . Not on file  Social Needs  . Financial resource strain: Not on file  . Food insecurity    Worry: Not on file    Inability: Not on file  . Transportation needs    Medical: Not on file    Non-medical: Not on file  Tobacco Use  . Smoking status: Never Smoker  . Smokeless tobacco: Never Used  Substance and Sexual Activity  . Alcohol use: Yes    Comment: weekends, occasional  . Drug use: No  . Sexual activity: Not on file   Lifestyle  . Physical activity    Days per week: Not on file    Minutes per session: Not on file  . Stress: Not on file  Relationships  . Social Herbalist on phone: Not on file    Gets together: Not on file    Attends religious service: Not on file    Active member of club or organization: Not on file    Attends meetings of clubs or organizations: Not on file    Relationship status: Not on file  . Intimate partner violence    Fear of current or ex partner: Not on file    Emotionally abused: Not on file    Physically abused: Not on file    Forced sexual activity: Not on file  Other Topics Concern  . Not on file  Social History Narrative  . Not on file    Current Outpatient Medications on File Prior to Visit  Medication Sig Dispense Refill  . ACCU-CHEK GUIDE test strip TEST UP TO FOUR TIMES A DAY AS DIRECTED (START WITH TESTING ONCE PER DAY) 400 each 1  . atorvastatin (LIPITOR) 20 MG tablet Take 1 tablet (20 mg total) by mouth daily at 6 PM. 90 tablet 0  . blood glucose meter kit and supplies Dispense based on patient and insurance preference. Use up  to four times daily as directed. (FOR ICD-10 E10.9, E11.9). Once per day for now. 1 each 0  . Cholecalciferol 50 MCG (2000 UT) CAPS Take 1 capsule (2,000 Units total) by mouth daily. 100 each 3  . lisinopril (ZESTRIL) 5 MG tablet Take 1 tablet (5 mg total) by mouth daily. 90 tablet 1  . metFORMIN (GLUCOPHAGE) 1000 MG tablet Take 1 tablet (1,000 mg total) by mouth 2 (two) times daily with a meal. 180 tablet 1  . omeprazole (PRILOSEC) 10 MG capsule Take 10 mg by mouth daily as needed (heartburn).      No current facility-administered medications on file prior to visit.     No Known Allergies  Family History  Problem Relation Age of Onset  . Alcoholism Mother   . Drug abuse Mother   . Alcoholism Father   . Lung cancer Maternal Grandfather   . Lung cancer Paternal Grandfather   . Colon cancer Neg Hx   . Stomach cancer  Neg Hx   . Esophageal cancer Neg Hx   . Thyroid disease Neg Hx     BP (!) 142/98 (BP Location: Left Arm, Patient Position: Sitting, Cuff Size: Large)   Pulse 90   Ht 6' 4"  (1.93 m)   Wt 259 lb 9.6 oz (117.8 kg)   SpO2 95%   BMI 31.60 kg/m    Review of Systems Denies neck pain.  Denies numbness    Objective:   Physical Exam VITAL SIGNS:  See vs page GENERAL: no distress Neck: a healed scar is present.  I do not appreciate a nodule in the thyroid or elsewhere in the neck   Lab Results  Component Value Date   TSH 0.59 02/23/2019       Assessment & Plan:  HTN: is noted today.  Hypothyroidism: he needs increased rx.  PTC: due for recheck.  Vit-D def: due for recheck.   Patient Instructions  Your blood pressure is high today.  Please see your primary care provider soon, to have it rechecked  Blood tests are requested for you today.  We'll let you know about the results.  Desired results are: Vit-D, calcium and parathyroid: all normal TSH: slightly low (refleacting slightly "too much" medication," to reduce the chances of the cancer coming back. TG: undetectable Anti-TG antibody: lower (undetectable would be better, but is unlikely this soon)  Please stop the combination calcium and vitamin-D pill.   Please come back for a follow-up appointment in 6 months.

## 2019-02-24 MED ORDER — LEVOTHYROXINE SODIUM 112 MCG PO TABS: 224.0000 ug | ORAL_TABLET | Freq: Every day | ORAL | 3 refills | Status: DC

## 2019-02-26 LAB — THYROGLOBULIN LEVEL: Thyroglobulin: <0.1 ng/mL — ABNORMAL LOW

## 2019-02-26 LAB — THYROGLOBULIN ANTIBODY: Thyroglobulin Ab: 126 IU/mL — ABNORMAL HIGH (ref <=1)

## 2019-02-26 LAB — PTH, INTACT AND CALCIUM
Calcium: 9.3 mg/dL (ref 8.6–10.3)
PTH: 21 pg/mL (ref 14–64)

## 2019-02-26 LAB — T4, FREE: Free T4: 1.5 ng/dL (ref 0.8–1.8)

## 2019-02-26 LAB — TSH: TSH: 0.59 mIU/L (ref 0.40–4.50)

## 2019-02-26 LAB — VITAMIN D 25 HYDROXY (VIT D DEFICIENCY, FRACTURES): Vit D, 25-Hydroxy: 31 ng/mL (ref 30–100)

## 2019-05-03 ENCOUNTER — Encounter: Payer: Self-pay | Admitting: Family Medicine

## 2019-05-03 ENCOUNTER — Telehealth: Payer: Self-pay

## 2019-05-03 ENCOUNTER — Other Ambulatory Visit: Payer: Self-pay

## 2019-05-03 ENCOUNTER — Ambulatory Visit (INDEPENDENT_AMBULATORY_CARE_PROVIDER_SITE_OTHER): Payer: BC Managed Care – PPO | Admitting: Family Medicine

## 2019-05-03 VITALS — BP 146/96 | HR 61 | Temp 98.6°F | Wt 260.4 lb

## 2019-05-03 DIAGNOSIS — E1165 Type 2 diabetes mellitus with hyperglycemia: Secondary | ICD-10-CM

## 2019-05-03 DIAGNOSIS — E785 Hyperlipidemia, unspecified: Secondary | ICD-10-CM | POA: Diagnosis not present

## 2019-05-03 DIAGNOSIS — I10 Essential (primary) hypertension: Secondary | ICD-10-CM

## 2019-05-03 MED ORDER — METFORMIN HCL 1000 MG PO TABS: 1000.0000 mg | ORAL_TABLET | Freq: Two times a day (BID) | ORAL | 1 refills | Status: DC

## 2019-05-03 MED ORDER — LISINOPRIL 10 MG PO TABS: 10.0000 mg | ORAL_TABLET | Freq: Every day | ORAL | 1 refills | Status: DC

## 2019-05-03 MED ORDER — ATORVASTATIN CALCIUM 20 MG PO TABS: 20.0000 mg | ORAL_TABLET | Freq: Every day | ORAL | 1 refills | Status: DC

## 2019-05-03 NOTE — Progress Notes (Signed)
Subjective:    Patient ID: Tyrone Lee, male    DOB: 09/07/1970, 48 y.o.   MRN: [id number]  HPI Tyrone Lee is a 48 y.o. male Presents today for: Chief Complaint  Patient presents with  . Diabetes    3 month diabetes f/u. Patient refuse the foot exam   Diabetes: Complicated by hyperglycemia.  Increase his dose of Lipitor at last visit given elevated LDL, on lisinopril 5 mg daily. Metformin increased to 1000 mg twice daily in July. Microalbumin: Normal ratio July 2020  Home readings: 150 or less. No symptomatic lows - lowest 113. 2hr PP 200-250.  No side effects or missed doses.    Optho, foot exam, pneumovax: Declines foot exam. Ophthalmology: plans to see - plans to schedule after Jan 1st.  Flu vaccine: refuses.  Colonoscopy: plans to schedule with his GI.  Exercise 3-4 days per week.  Rare fast food, no sweetened beverages.   Lab Results  Component Value Date   HGBA1C 8.5 (H) 02/01/2019   HGBA1C 8.1 (H) 07/17/2018   HGBA1C 7.2 (H) 05/05/2018   Lab Results  Component Value Date   LDLCALC 118 (H) 02/01/2019   CREATININE 1.02 02/01/2019   Hypertension: BP Readings from Last 3 Encounters:  05/03/19 (!) 146/96  02/23/19 (!) 142/98  02/01/19 138/88   Lab Results  Component Value Date   CREATININE 1.02 02/01/2019  Borderline last visit, continued lisinopril.  Advised to monitor at home with RTC precautions if over 140/90. Not checking home BP's.   Hyperlipidemia:  Lab Results  Component Value Date   CHOL 194 02/01/2019   HDL 40 02/01/2019   LDLCALC 118 (H) 02/01/2019   TRIG 182 (H) 02/01/2019   CHOLHDL 4.9 02/01/2019   Lab Results  Component Value Date   ALT 36 02/01/2019   AST 19 02/01/2019   ALKPHOS 61 02/01/2019   BILITOT 1.1 02/01/2019  Lipitor increased to 20 mg last visit.  No new myalgia/side effects.   Review of Systems  Constitutional: Negative for fatigue and unexpected weight change.  Eyes: Negative for visual disturbance.   Respiratory: Negative for cough, chest tightness and shortness of breath.   Cardiovascular: Negative for chest pain, palpitations and leg swelling.  Gastrointestinal: Negative for abdominal pain and blood in stool.  Neurological: Negative for dizziness, light-headedness and headaches.       Objective:   Physical Exam Vitals signs reviewed.  Constitutional:      Appearance: He is well-developed.  HENT:     Head: Normocephalic and atraumatic.  Eyes:     Pupils: Pupils are equal, round, and reactive to light.  Neck:     Vascular: No carotid bruit or JVD.     Comments: Well healed anterior scar.  Cardiovascular:     Rate and Rhythm: Normal rate and regular rhythm.     Heart sounds: Normal heart sounds. No murmur.  Pulmonary:     Effort: Pulmonary effort is normal.     Breath sounds: Normal breath sounds. No rales.  Skin:    General: Skin is warm and dry.  Neurological:     Mental Status: He is alert and oriented to person, place, and time.        Assessment & Plan:   Tyrone Lee is a 48 y.o. male Type 2 diabetes mellitus with hyperglycemia, without long-term current use of insulin ([place]) - Plan: Hemoglobin A1c, metFORMIN (GLUCOPHAGE) 1000 MG tablet  -Likely will need additional med, consider GLP-1 versus SGLT2.  He would prefer GLP-1, but with his history of thyroid cancer we will asked that he discuss that first with endocrinology.  Likely will not be an issue as his was papillary thyroid cancer.  Continue metformin same dose for now  Hyperlipidemia, unspecified hyperlipidemia type - Plan: atorvastatin (LIPITOR) 20 MG tablet, Lipid Panel, Comprehensive metabolic panel  -Tolerating higher dose, continue same.  Labs pending  Essential hypertension - Plan: lisinopril (ZESTRIL) 10 MG tablet  -Labs as above, increase lisinopril for improved control.  Recheck 3 months  Health maintenance discussed, he will schedule colonoscopy and ophthalmology evaluation.  Declines flu  vaccine.  Declines foot exam.   Meds ordered this encounter  Medications  . atorvastatin (LIPITOR) 20 MG tablet    Sig: Take 1 tablet (20 mg total) by mouth daily at 6 PM.    Dispense:  90 tablet    Refill:  1  . lisinopril (ZESTRIL) 10 MG tablet    Sig: Take 1 tablet (10 mg total) by mouth daily.    Dispense:  90 tablet    Refill:  1  . metFORMIN (GLUCOPHAGE) 1000 MG tablet    Sig: Take 1 tablet (1,000 mg total) by mouth 2 (two) times daily with a meal.    Dispense:  180 tablet    Refill:  1   Patient Instructions   Likely will need to add medication for diabetes, but can wait for results.  I would consider using a medicine called Trulicity but would like you to ask your endocrinologist and make sure he is okay with me using that with your previous thyroid cancer.  If not we can use a class of medicines called SGLT2's such as Jardiance. Blood pressure still too high, increase lisinopril to 10 mg/day.  Continue same dose of Lipitor for now. Recheck in 3 months.  If you have lab work done today you will be contacted with your lab results within the next 2 weeks.  If you have not heard from [place] then please contact us. The fastest way to get your results is to register for My Chart.   IF you received an x-ray today, you will receive an invoice from [facility]. Please contact [facility] at [phone] with questions or concerns regarding your invoice.   IF you received labwork today, you will receive an invoice from [facility]. Please contact [facility] at [phone] with questions or concerns regarding your invoice.   Our billing staff will not be able to assist you with questions regarding bills from these companies.  You will be contacted with the lab results as soon as they are available. The fastest way to get your results is to activate your My Chart account. Instructions are located on the last page of this paperwork. If you have not heard from [place] regarding the  results in 2 weeks, please contact this office.       Signed,   Merri Ray, MD [facility].  05/03/19 4:05 PM

## 2019-05-03 NOTE — Telephone Encounter (Signed)
OK with me.

## 2019-05-03 NOTE — Telephone Encounter (Signed)
Patient called in needing verify if its ok to take Trulicity. Other doctor wants to prescribe need the ok from Dr. Loanne Drilling    Please call and advise

## 2019-05-03 NOTE — Patient Instructions (Addendum)
Likely will need to add medication for diabetes, but can wait for results.  I would consider using a medicine called Trulicity but would like you to ask your endocrinologist and make sure he is okay with me using that with your previous thyroid cancer.  If not we can use a class of medicines called SGLT2's such as Jardiance. Blood pressure still too high, increase lisinopril to 10 mg/day.  Continue same dose of Lipitor for now. Recheck in 3 months.  If you have lab work done today you will be contacted with your lab results within the next 2 weeks.  If you have not heard from [place] then please contact us. The fastest way to get your results is to register for My Chart.   IF you received an x-ray today, you will receive an invoice from [facility]. Please contact [facility] at [phone] with questions or concerns regarding your invoice.   IF you received labwork today, you will receive an invoice from [facility]. Please contact [facility] at [phone] with questions or concerns regarding your invoice.   Our billing staff will not be able to assist you with questions regarding bills from these companies.  You will be contacted with the lab results as soon as they are available. The fastest way to get your results is to activate your My Chart account. Instructions are located on the last page of this paperwork. If you have not heard from [place] regarding the results in 2 weeks, please contact this office.

## 2019-05-04 LAB — COMPREHENSIVE METABOLIC PANEL
ALT: 30 IU/L (ref 0–44)
AST: 23 IU/L (ref 0–40)
Albumin/Globulin Ratio: 2 (ref 1.2–2.2)
Albumin: 4.8 g/dL (ref 4.0–5.0)
Alkaline Phosphatase: 67 IU/L (ref 39–117)
BUN/Creatinine Ratio: 13 (ref 9–20)
BUN: 13 mg/dL (ref 6–24)
Bilirubin Total: 1.1 mg/dL (ref 0.0–1.2)
CO2: 22 mmol/L (ref 20–29)
Calcium: 9.9 mg/dL (ref 8.7–10.2)
Chloride: 103 mmol/L (ref 96–106)
Creatinine, Ser: 1.02 mg/dL (ref 0.76–1.27)
GFR calc Af Amer: 99 mL/min/{1.73_m2} (ref >59)
GFR calc non Af Amer: 85 mL/min/{1.73_m2} (ref >59)
Globulin, Total: 2.4 g/dL (ref 1.5–4.5)
Glucose: 101 mg/dL — ABNORMAL HIGH (ref 65–99)
Potassium: 4 mmol/L (ref 3.5–5.2)
Sodium: 142 mmol/L (ref 134–144)
Total Protein: 7.2 g/dL (ref 6.0–8.5)

## 2019-05-04 LAB — LIPID PANEL
Chol/HDL Ratio: 4.7 ratio (ref 0.0–5.0)
Cholesterol, Total: 187 mg/dL (ref 100–199)
HDL: 40 mg/dL (ref >39)
LDL Chol Calc (NIH): 122 mg/dL — ABNORMAL HIGH (ref 0–99)
Triglycerides: 140 mg/dL (ref 0–149)
VLDL Cholesterol Cal: 25 mg/dL (ref 5–40)

## 2019-05-04 LAB — HEMOGLOBIN A1C
Est. average glucose Bld gHb Est-mCnc: 177 mg/dL
Hgb A1c MFr Bld: 7.8 % — ABNORMAL HIGH (ref 4.8–5.6)

## 2019-05-04 NOTE — Telephone Encounter (Signed)
Pt notified via My Chart

## 2019-05-10 ENCOUNTER — Encounter: Payer: Self-pay | Admitting: Family Medicine

## 2019-05-10 DIAGNOSIS — E1165 Type 2 diabetes mellitus with hyperglycemia: Secondary | ICD-10-CM

## 2019-05-13 MED ORDER — TRULICITY 0.75 MG/0.5ML ~~LOC~~ SOAJ: 0.7500 mg | SUBCUTANEOUS | 1 refills | Status: DC

## 2019-08-02 ENCOUNTER — Other Ambulatory Visit: Payer: Self-pay

## 2019-08-02 ENCOUNTER — Ambulatory Visit (INDEPENDENT_AMBULATORY_CARE_PROVIDER_SITE_OTHER): Payer: BC Managed Care – PPO | Admitting: Family Medicine

## 2019-08-02 ENCOUNTER — Encounter: Payer: Self-pay | Admitting: Family Medicine

## 2019-08-02 DIAGNOSIS — E1165 Type 2 diabetes mellitus with hyperglycemia: Secondary | ICD-10-CM | POA: Diagnosis not present

## 2019-08-02 MED ORDER — TRULICITY 0.75 MG/0.5ML ~~LOC~~ SOAJ: 0.7500 mg | SUBCUTANEOUS | 2 refills | Status: DC

## 2019-08-02 NOTE — Patient Instructions (Addendum)
  Restart metformin twice per day.  Continue trulicity once per week.  Depending on home readings, can discuss further changes.  Let me know your fasting and 2-hour blood sugar readings in the next 3 to 4 weeks by MyChart update. No other med changes for now.  Let me know if there are further questions, recheck in office in 3 months.     If you have lab work done today you will be contacted with your lab results within the next 2 weeks.  If you have not heard from [place] then please contact us. The fastest way to get your results is to register for My Chart.   IF you received an x-ray today, you will receive an invoice from [facility]. Please contact [facility]iology at [phone] with questions or concerns regarding your invoice.   IF you received labwork today, you will receive an invoice from [facility]. Please contact [facility] at [phone] with questions or concerns regarding your invoice.   Our billing staff will not be able to assist you with questions regarding bills from these companies.  You will be contacted with the lab results as soon as they are available. The fastest way to get your results is to activate your My Chart account. Instructions are located on the last page of this paperwork. If you have not heard from [place] regarding the results in 2 weeks, please contact this office.

## 2019-08-02 NOTE — Progress Notes (Signed)
Subjective:  Patient ID: Tyrone Lee, male    DOB: Feb 14, 1971  Age: 49 y.o. MRN: [id number]  CC:  Chief Complaint  Patient presents with  . Follow-up    3 month follow-up visit on Diabetes. pt state he hasn't had any issues controling his diabetes. pt hasn't had any episodes of really high/low blood sugar. pt reports no foot sores and no change in pt's vision    HPI [name] presents for   Diabetes: Complicated by hyperglycemia previously.  A1c 7.8 in October on Metformin.  Started on Trulicity 0.93AT Qweek. No abd pain/n/v. No new side effects.  He is on statin as well as ACE inhibitor.  Lisinopril increased last visit for improved control. Stopped metformin - misunderstood, thought trulicity was replacing that med.  Home readings - 150-200 postprandial Fasting 130's.  No home BP's. No new cough, or other side effects.   Microalbumin:  Lab Results  Component Value Date   LABMICR 8.3 02/01/2019   LABMICR 8.6 08/04/2017   Optho, foot exam, pneumovax: Due for ophthalmology exam  Health maintenance: Due for colonoscopy, flu vaccine, HIV screening. -Plans to schedule colonoscopy, declines flu vaccine, HIV testing.  Lab Results  Component Value Date   HGBA1C 7.8 (H) 05/03/2019   HGBA1C 8.5 (H) 02/01/2019   HGBA1C 8.1 (H) 07/17/2018   Lab Results  Component Value Date   LDLCALC 122 (H) 05/03/2019   CREATININE 1.02 05/03/2019    History Patient Active Problem List   Diagnosis Date Noted  . Hyperparathyroidism ([place]) 07/27/2018  . Hypothyroidism 07/27/2018  . Thyroid cancer ([place]) 07/19/2018   Past Medical History:  Diagnosis Date  . Acid reflux   . Diabetes mellitus without complication ([place])   . Hemorrhoids   . Hypertension    Past Surgical History:  Procedure Laterality Date  . RADICAL NECK DISSECTION Right 07/19/2018   Procedure: RIGHT NECK DISSECTION;  Surgeon: Rozetta Nunnery, MD;  Location: [place];  Service: ENT;  Laterality: Right;  .  THYROIDECTOMY Right 07/19/2018   Procedure: TOTAL THYROIDECTOMY AND RIGHT NECK DISSECTION;  Surgeon: Rozetta Nunnery, MD;  Location: [place];  Service: ENT;  Laterality: Right;   No Known Allergies Prior to Admission medications   Medication Sig Start Date End Date Taking? Authorizing Provider  ACCU-CHEK GUIDE test strip TEST UP TO FOUR TIMES A DAY AS DIRECTED (START WITH TESTING ONCE PER DAY) 04/21/18  Yes Wendie Agreste, MD  atorvastatin (LIPITOR) 20 MG tablet Take 1 tablet (20 mg total) by mouth daily at 6 PM. 05/03/19  Yes Wendie Agreste, MD  blood glucose meter kit and supplies Dispense based on patient and insurance preference. Use up to four times daily as directed. (FOR ICD-10 E10.9, E11.9). Once per day for now. 08/04/17  Yes Wendie Agreste, MD  Cholecalciferol 50 MCG (2000 UT) CAPS Take 1 capsule (2,000 Units total) by mouth daily. 07/31/18  Yes Renato Shin, MD  Dulaglutide (TRULICITY) 5.57 DU/2.0UR SOPN Inject 0.75 mg into the skin once a week. 05/13/19  Yes Wendie Agreste, MD  levothyroxine (SYNTHROID) 112 MCG tablet Take 2 tablets (224 mcg total) by mouth daily before breakfast. 02/24/19  Yes Renato Shin, MD  lisinopril (ZESTRIL) 10 MG tablet Take 1 tablet (10 mg total) by mouth daily. 05/03/19  Yes Wendie Agreste, MD  metFORMIN (GLUCOPHAGE) 1000 MG tablet Take 1 tablet (1,000 mg total) by mouth 2 (two) times daily with a meal. 05/03/19  Yes Wendie Agreste,  MD  omeprazole (PRILOSEC) 10 MG capsule Take 10 mg by mouth daily as needed (heartburn).    Yes [provider]   Social History   Socioeconomic History  . Marital status: Married    Spouse name: Not on file  . Number of children: 2  . Years of education: Not on file  . Highest education level: Not on file  Occupational History  . Not on file  Tobacco Use  . Smoking status: Never Smoker  . Smokeless tobacco: Never Used  Substance and Sexual Activity  . Alcohol use: Yes    Comment: weekends,  occasional  . Drug use: No  . Sexual activity: Not on file  Other Topics Concern  . Not on file  Social History Narrative  . Not on file   Social Determinants of Health   Financial Resource Strain:   . Difficulty of Paying Living Expenses: Not on file  Food Insecurity:   . Worried About Charity fundraiser in the Last Year: Not on file  . Ran Out of Food in the Last Year: Not on file  Transportation Needs:   . Lack of Transportation (Medical): Not on file  . Lack of Transportation (Non-Medical): Not on file  Physical Activity:   . Days of Exercise per Week: Not on file  . Minutes of Exercise per Session: Not on file  Stress:   . Feeling of Stress : Not on file  Social Connections:   . Frequency of Communication with Friends and Family: Not on file  . Frequency of Social Gatherings with Friends and Family: Not on file  . Attends Religious Services: Not on file  . Active Member of Clubs or Organizations: Not on file  . Attends Archivist Meetings: Not on file  . Marital Status: Not on file  Intimate Partner Violence:   . Fear of Current or Ex-Partner: Not on file  . Emotionally Abused: Not on file  . Physically Abused: Not on file  . Sexually Abused: Not on file    Review of Systems  Constitutional: Negative for fatigue and unexpected weight change.  Eyes: Negative for visual disturbance.  Respiratory: Negative for cough, chest tightness and shortness of breath.   Cardiovascular: Negative for chest pain, palpitations and leg swelling.  Gastrointestinal: Negative for abdominal pain and blood in stool.  Neurological: Negative for dizziness, light-headedness and headaches.     Objective:   Vitals:   08/02/19 1514 08/02/19 1520  BP: (!) 135/94 130/86  Pulse: 73   Temp: 98 F (36.7 C)   TempSrc: Temporal   SpO2: 100%   Weight: 257 lb (116.6 kg)   Height: 6' 4"  (1.93 m)      Physical Exam Vitals reviewed.  Constitutional:      Appearance: He is  well-developed.  HENT:     Head: Normocephalic and atraumatic.  Eyes:     Pupils: Pupils are equal, round, and reactive to light.  Neck:     Vascular: No carotid bruit or JVD.  Cardiovascular:     Rate and Rhythm: Normal rate and regular rhythm.     Heart sounds: Normal heart sounds. No murmur.  Pulmonary:     Effort: Pulmonary effort is normal.     Breath sounds: Normal breath sounds. No rales.  Skin:    General: Skin is warm and dry.  Neurological:     Mental Status: He is alert and oriented to person, place, and time.  Assessment & Plan:  AARIAN GRIFFIE is a 49 y.o. male . Type 2 diabetes mellitus with hyperglycemia, without long-term current use of insulin ([facility]) - Plan: Dulaglutide (TRULICITY) 3.22 GU/5.4YH SOPN, Hemoglobin A1c Unfortunately some confusion regarding medication regimen.  Has tolerated Trulicity, but will restart Metformin in addition to Trulicity.  Monitor home readings with update by MyChart in the next 4 to 6 weeks.  No other med changes for now, blood pressure improved.  In office eval in 3 months with labs.  Check A1c today. Meds ordered this encounter  Medications  . Dulaglutide (TRULICITY) 0.62 BJ/6.2GB SOPN    Sig: Inject 0.75 mg into the skin once a week.    Dispense:  6 mL    Refill:  2   Patient Instructions    Restart metformin twice per day.  Continue trulicity once per week.  Depending on home readings, can discuss further changes.  Let me know your fasting and 2-hour blood sugar readings in the next 3 to 4 weeks by MyChart update. No other med changes for now.  Let me know if there are further questions, recheck in office in 3 months.     If you have lab work done today you will be contacted with your lab results within the next 2 weeks.  If you have not heard from [place] then please contact us. The fastest way to get your results is to register for My Chart.   IF you received an x-ray today, you will receive an invoice from  [facility]. Please contact [facility] Radiology at [phone] with questions or concerns regarding your invoice.   IF you received labwork today, you will receive an invoice from [place]. Please contact [facility] at [phone] with questions or concerns regarding your invoice.   Our billing staff will not be able to assist you with questions regarding bills from these companies.  You will be contacted with the lab results as soon as they are available. The fastest way to get your results is to activate your My Chart account. Instructions are located on the last page of this paperwork. If you have not heard from [place] regarding the results in 2 weeks, please contact this office.         Signed, Merri Ray, MD [facility]

## 2019-08-03 LAB — HEMOGLOBIN A1C
Est. average glucose Bld gHb Est-mCnc: 194 mg/dL
Hgb A1c MFr Bld: 8.4 % — ABNORMAL HIGH (ref 4.8–5.6)

## 2019-08-19 IMAGING — NM NM [ID] THYROID CANCER METS SP CA TX
6 series · 6 of 6 positions shown · non-contrast
Comparison: None.

CLINICAL DATA: Differentiated thyroid cancer post radioactive
iodine therapy following thyroidectomy

EXAM:
NUCLEAR MEDICINE S-7T7 POST THERAPY WHOLE BODY SCAN
TECHNIQUE: The patient received 127.1 mCi S-7T7 sodium iodide for the treatment
of thyroid cancer within the past 10 days. The patient returns
today, and whole body scanning was performed in the anterior and
posterior projections.

[Series 1: marker · 4.14mm/px · 1 of 1 slices shown (1 of 2)]
[im 1/1  full-range]
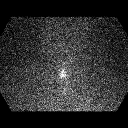

[Series 1: marker · 4.14mm/px · 1 of 1 slices shown (2 of 2)]
[im 1/1]
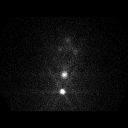

[Series 2: static thyroid no marker · 4.14mm/px · 1 of 1 slices shown (1 of 2)]
[im 1/1  full-range]
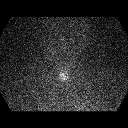

[Series 2: static thyroid no marker · 4.14mm/px · 1 of 1 slices shown (2 of 2)]
[im 1/1]
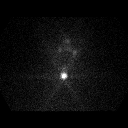

[Series 3: i131 whole body · 2.66mm/px · 1 of 1 slices shown (1 of 2)]
[im 1/1  full-range]
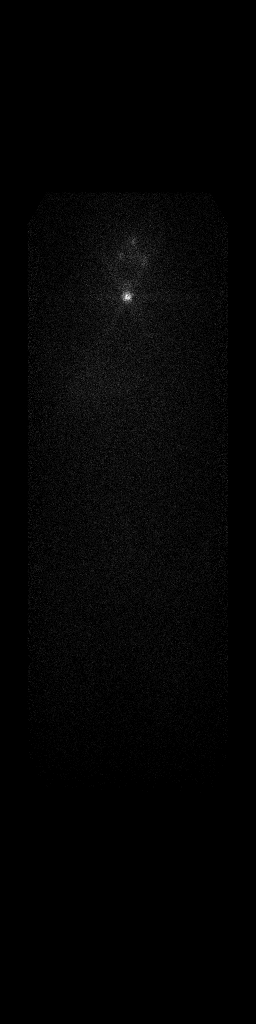

[Series 3: i131 whole body · 2.66mm/px · 1 of 1 slices shown (2 of 2)]
[im 1/1  full-range]
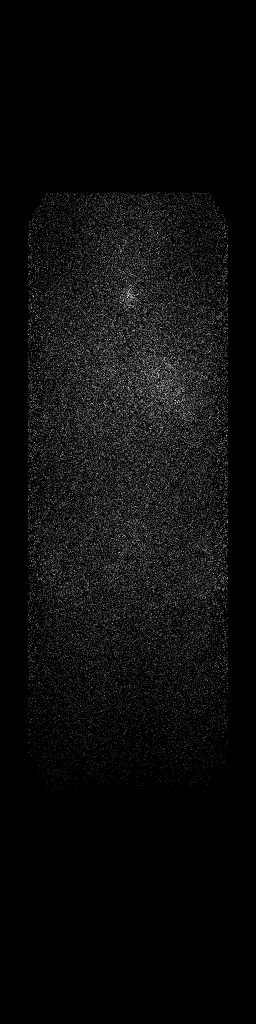

[6 of 6 positions shown; findings below may reference images not displayed]

FINDINGS: Small focus of abnormal radio iodine accumulation identified in the
mid cervical region at or just LEFT of midline, could represent
thyroid remnant or a metastatic lymph node.

No additional sites of abnormal radio iodine accumulation are
identified.

Physiologic tracer within liver and salivary glands.
IMPRESSION: Single focus of abnormal radio iodine accumulation at the mid
cervical region, could represent thyroid remnant versus metastatic
lymph node.

## 2019-09-04 ENCOUNTER — Other Ambulatory Visit: Payer: Self-pay | Admitting: Endocrinology

## 2019-09-04 NOTE — Telephone Encounter (Signed)
Please advise 

## 2019-10-27 ENCOUNTER — Other Ambulatory Visit: Payer: Self-pay | Admitting: Family Medicine

## 2019-10-27 DIAGNOSIS — I10 Essential (primary) hypertension: Secondary | ICD-10-CM

## 2019-10-27 DIAGNOSIS — E785 Hyperlipidemia, unspecified: Secondary | ICD-10-CM

## 2019-10-27 NOTE — Telephone Encounter (Signed)
Requested Prescriptions  Pending Prescriptions Disp Refills  . atorvastatin (LIPITOR) 20 MG tablet [Pharmacy Med Name: ATORVASTATIN 20 MG TABLET] 90 tablet 1    Sig: TAKE 1 TABLET (20 MG TOTAL) BY MOUTH DAILY AT 6 PM.     Cardiovascular:  Antilipid - Statins Failed - 10/27/2019  9:47 AM      Failed - LDL in normal range and within 360 days    LDL Chol Calc (NIH)  Date Value Ref Range Status  05/03/2019 122 (H) 0 - 99 mg/dL Final         Passed - Total Cholesterol in normal range and within 360 days    Cholesterol, Total  Date Value Ref Range Status  05/03/2019 187 100 - 199 mg/dL Final         Passed - HDL in normal range and within 360 days    HDL  Date Value Ref Range Status  05/03/2019 40 >39 mg/dL Final         Passed - Triglycerides in normal range and within 360 days    Triglycerides  Date Value Ref Range Status  05/03/2019 140 0 - 149 mg/dL Final         Passed - Patient is not pregnant      Passed - Valid encounter within last 12 months    Recent Outpatient Visits          2 months ago Type 2 diabetes mellitus with hyperglycemia, without long-term current use of insulin ([place])   [facility] at [place], Ranell Patrick, MD   5 months ago Type 2 diabetes mellitus with hyperglycemia, without long-term current use of insulin [facility])   [facility] at [place], Ranell Patrick, MD   8 months ago Hyperlipidemia, unspecified hyperlipidemia type   [facility] at [place], Ranell Patrick, MD   1 year ago Type 2 diabetes mellitus without complication, without long-term current use of insulin [facility])   [facility] [place], Ranell Patrick, MD   1 year ago Essential hypertension   [facility] [place], Ranell Patrick, MD      Future Appointments            In 5 days Wendie Agreste, MD Primary Care at [place] [facility] [place], [facility]           . lisinopril (ZESTRIL) 10 MG tablet [Pharmacy Med Name: LISINOPRIL 10 MG TABLET] 90 tablet 1    Sig: TAKE 1 TABLET BY MOUTH EVERY DAY      Cardiovascular:  ACE Inhibitors Passed - 10/27/2019  9:47 AM      Passed - Cr in normal range and within 180 days    Creatinine, Ser  Date Value Ref Range Status  05/03/2019 1.02 0.76 - 1.27 mg/dL Final         Passed - K in normal range and within 180 days    Potassium  Date Value Ref Range Status  05/03/2019 4.0 3.5 - 5.2 mmol/L Final         Passed - Patient is not pregnant      Passed - Last BP in normal range    BP Readings from Last 1 Encounters:  08/02/19 130/86         Passed - Valid encounter within last 6 months    Recent Outpatient Visits          2 months ago Type 2 diabetes mellitus with hyperglycemia, without long-term current use of insulin ([place])   [facility] [place], [place]   5 months ago Type 2 diabetes mellitus with hyperglycemia, without long-term current use of insulin [facility])   [facility] at [place], Ranell Patrick, MD   8 months ago Hyperlipidemia, unspecified hyperlipidemia type   [facility] at [place], Ranell Patrick, MD   1 year ago Type 2 diabetes mellitus without complication, without long-term current use of insulin [facility])   [facility] at [place], Ranell Patrick, MD   1 year ago Essential hypertension   [facility] at [place], Ranell Patrick, MD      Future Appointments            In 5 days Wendie Agreste, MD Primary Care at [place], [facility]

## 2019-11-01 ENCOUNTER — Encounter: Payer: Self-pay | Admitting: Family Medicine

## 2019-11-01 ENCOUNTER — Ambulatory Visit (INDEPENDENT_AMBULATORY_CARE_PROVIDER_SITE_OTHER): Payer: BC Managed Care – PPO | Admitting: Family Medicine

## 2019-11-01 ENCOUNTER — Other Ambulatory Visit: Payer: Self-pay

## 2019-11-01 VITALS — BP 131/90 | HR 82 | Temp 98.1°F | Ht 76.0 in | Wt 246.0 lb

## 2019-11-01 DIAGNOSIS — E1165 Type 2 diabetes mellitus with hyperglycemia: Secondary | ICD-10-CM

## 2019-11-01 DIAGNOSIS — I10 Essential (primary) hypertension: Secondary | ICD-10-CM

## 2019-11-01 DIAGNOSIS — E785 Hyperlipidemia, unspecified: Secondary | ICD-10-CM | POA: Diagnosis not present

## 2019-11-01 MED ORDER — TRULICITY 0.75 MG/0.5ML ~~LOC~~ SOAJ: 0.7500 mg | SUBCUTANEOUS | 2 refills | Status: DC

## 2019-11-01 MED ORDER — METFORMIN HCL 1000 MG PO TABS: 1000.0000 mg | ORAL_TABLET | Freq: Two times a day (BID) | ORAL | 1 refills | Status: DC

## 2019-11-01 NOTE — Patient Instructions (Addendum)
  No med changes for now. Recheck in 3 months. Thanks for coming in today.    If you have lab work done today you will be contacted with your lab results within the next 2 weeks.  If you have not heard from [place] then please contact us. The fastest way to get your results is to register for My Chart.   IF you received an x-ray today, you will receive an invoice from [facility]. Please contact [facility] at [phone] with questions or concerns regarding your invoice.   IF you received labwork today, you will receive an invoice from [place]. Please contact [facility] at [phone] with questions or concerns regarding your invoice.   Our billing staff will not be able to assist you with questions regarding bills from these companies.  You will be contacted with the lab results as soon as they are available. The fastest way to get your results is to activate your My Chart account. Instructions are located on the last page of this paperwork. If you have not heard from [place] regarding the results in 2 weeks, please contact this office.

## 2019-11-01 NOTE — Progress Notes (Signed)
Subjective:  Patient ID: Tyrone Lee, male    DOB: 1971-04-18  Age: 49 y.o. MRN: [id number]  CC:  Chief Complaint  Patient presents with  . Diabetes    pt reports no issues with his diabetes. pt states its well undercontrol with no hight BS events or low BS events. pt states his current medication seem to work well with no side effects. Pt checks his BS a couple times a week. todays reading 1102m/dl     HPI Tyrone FRATICELLIpresents for   Diabetes: Complicated by hyperglycemia.   Misunderstanding with meds prior to last visit - stopped metformin temporarily when started trulicity. Now on metformin 10488QBBID and trulicity 01.69IHQ qweek without new side effects.  Fasting: 108, 113, 120 2hr PP: 150, 180, 200. No symptomatic lows.  On stain and ACE -I  Microalbumin: nl in 01/2019.  Optho, foot exam, pneumovax: due for optho - has not scheduled.  Plans to schedule colonoscopy.   Lab Results  Component Value Date   HGBA1C 8.4 (H) 08/02/2019   HGBA1C 7.8 (H) 05/03/2019   HGBA1C 8.5 (H) 02/01/2019   Lab Results  Component Value Date   LDLCALC 122 (H) 05/03/2019   CREATININE 1.02 05/03/2019   Hyperlipidemia: On atorvastatin 228mQD  - no new myalgias, side effects.  Fasting today.  Lab Results  Component Value Date   CHOL 187 05/03/2019   HDL 40 05/03/2019   LDLCALC 122 (H) 05/03/2019   TRIG 140 05/03/2019   CHOLHDL 4.7 05/03/2019   Lab Results  Component Value Date   ALT 30 05/03/2019   AST 23 05/03/2019   ALKPHOS 67 05/03/2019   BILITOT 1.1 05/03/2019    Hypertension: Lisinopril 1079md.  Home readings: none. No new med side effects.  BP Readings from Last 3 Encounters:  11/01/19 131/90  08/02/19 130/86  05/03/19 (!) 146/96   Lab Results  Component Value Date   CREATININE 1.02 05/03/2019       History Patient Active Problem List   Diagnosis Date Noted  . Hyperparathyroidism ([place]CC[place]1/23/2020  . Hypothyroidism 07/27/2018  . Thyroid cancer  (HCCEfl[place]1/15/2020   Past Medical History:  Diagnosis Date  . Acid reflux   . Diabetes mellitus without complication ([place] . Hemorrhoids   . Hypertension    Past Surgical History:  Procedure Laterality Date  . RADICAL NECK DISSECTION Right 07/19/2018   Procedure: RIGHT NECK DISSECTION;  Surgeon: NewRozetta NunneryD;  [facility] [place] [place];  Laterality: Right;  . THYROIDECTOMY Right 07/19/2018   Procedure: TOTAL THYROIDECTOMY AND RIGHT NECK DISSECTION;  Surgeon: NewRozetta NunneryD;  [facility] [place] [place];  Laterality: Right;   No Known Allergies Prior to Admission medications   Medication Sig Start Date End Date Taking? Authorizing Provider  ACCU-CHEK GUIDE test strip TEST UP TO FOUR TIMES A DAY AS DIRECTED (START WITH TESTING ONCE PER DAY) 04/21/18  Yes GreWendie AgresteD  atorvastatin (LIPITOR) 20 MG tablet TAKE 1 TABLET (20 MG TOTAL) BY MOUTH DAILY AT 6 PM. 10/27/19  Yes GreWendie AgresteD  blood glucose meter kit and supplies Dispense based on patient and insurance preference. Use up to four times daily as directed. (FOR ICD-10 E10.9, E11.9). Once per day for now. 08/04/17  Yes GreWendie AgresteD  [employer] D3 50 MCG (2000 UT) CAPS TAKE 1 CAPSULE BY MOUTH EVERY DAY 09/04/19  Yes EllRenato ShinD  Dulaglutide (TRULICITY)  0.75 MG/0.5ML SOPN Inject 0.75 mg into the skin once a week. 08/02/19  Yes Wendie Agreste, MD  levothyroxine (SYNTHROID) 112 MCG tablet Take 2 tablets (224 mcg total) by mouth daily before breakfast. 02/24/19  Yes Renato Shin, MD  lisinopril (ZESTRIL) 10 MG tablet TAKE 1 TABLET BY MOUTH EVERY DAY 10/27/19  Yes Wendie Agreste, MD  metFORMIN (GLUCOPHAGE) 1000 MG tablet Take 1 tablet (1,000 mg total) by mouth 2 (two) times daily with a meal. 05/03/19  Yes Wendie Agreste, MD  omeprazole (PRILOSEC) 10 MG capsule Take 10 mg by mouth daily as needed (heartburn).    Yes [provider]   Social History   Socioeconomic History  .  Marital status: Married    Spouse name: Not on file  . Number of children: 2  . Years of education: Not on file  . Highest education level: Not on file  Occupational History  . Not on file  Tobacco Use  . Smoking status: Never Smoker  . Smokeless tobacco: Never Used  Substance and Sexual Activity  . Alcohol use: Yes    Comment: weekends, occasional  . Drug use: No  . Sexual activity: Not on file  Other Topics Concern  . Not on file  Social History Narrative  . Not on file   Social Determinants of Health   Financial Resource Strain:   . Difficulty of Paying Living Expenses:   Food Insecurity:   . Worried About Charity fundraiser in the Last Year:   . Arboriculturist in the Last Year:   Transportation Needs:   . Film/video editor (Medical):   Marland Kitchen Lack of Transportation (Non-Medical):   Physical Activity:   . Days of Exercise per Week:   . Minutes of Exercise per Session:   Stress:   . Feeling of Stress :   Social Connections:   . Frequency of Communication with Friends and Family:   . Frequency of Social Gatherings with Friends and Family:   . Attends Religious Services:   . Active Member of Clubs or Organizations:   . Attends Archivist Meetings:   Marland Kitchen Marital Status:   Intimate Partner Violence:   . Fear of Current or Ex-Partner:   . Emotionally Abused:   Marland Kitchen Physically Abused:   . Sexually Abused:     Review of Systems  Constitutional: Negative for fatigue and unexpected weight change.  Eyes: Negative for visual disturbance.  Respiratory: Negative for cough, chest tightness and shortness of breath.   Cardiovascular: Negative for chest pain, palpitations and leg swelling.  Gastrointestinal: Negative for abdominal pain and blood in stool.  Neurological: Negative for dizziness, light-headedness and headaches.     Objective:   Vitals:   11/01/19 1603  BP: 131/90  Pulse: 82  Temp: 98.1 F (36.7 C)  TempSrc: Temporal  SpO2: 97%  Weight: 246 lb  (111.6 kg)  Height: 6' 4"  (1.93 m)     Physical Exam Vitals reviewed.  Constitutional:      Appearance: He is well-developed.  HENT:     Head: Normocephalic and atraumatic.  Eyes:     Pupils: Pupils are equal, round, and reactive to light.  Neck:     Vascular: No carotid bruit or JVD.  Cardiovascular:     Rate and Rhythm: Normal rate and regular rhythm.     Heart sounds: Normal heart sounds. No murmur.  Pulmonary:     Effort: Pulmonary effort is normal.  Breath sounds: Normal breath sounds. No rales.  Skin:    General: Skin is warm and dry.  Neurological:     Mental Status: He is alert and oriented to person, place, and time.        Assessment & Plan:  Tyrone Lee is a 49 y.o. male . Type 2 diabetes mellitus with hyperglycemia, without long-term current use of insulin (HCC) - Plan: Hemoglobin A1c  - no med changes for now, check labs. Anticipate improved control.   Hyperlipidemia, unspecified hyperlipidemia type - Plan: Comprehensive metabolic panel, Lipid panel  - tolerating statin - labs pending.   Essential hypertension - Plan: Comprehensive metabolic panel, Lipid panel  -  Stable, tolerating current regimen. Labs pending as above.    Meds ordered this encounter  Medications  . Dulaglutide (TRULICITY) 1.88 QL/7.3PV SOPN    Sig: Inject 0.75 mg into the skin once a week.    Dispense:  6 mL    Refill:  2  . metFORMIN (GLUCOPHAGE) 1000 MG tablet    Sig: Take 1 tablet (1,000 mg total) by mouth 2 (two) times daily with a meal.    Dispense:  180 tablet    Refill:  1   Patient Instructions    No med changes for now. Recheck in 3 months. Thanks for coming in today.    If you have lab work done today you will be contacted with your lab results within the next 2 weeks.  If you have not heard from [place] then please contact us. The fastest way to get your results is to register for My Chart.   IF you received an x-ray today, you will receive an invoice from  [facility]. Please contact [facility] Radiology at [phone] with questions or concerns regarding your invoice.   IF you received labwork today, you will receive an invoice from [facility]. Please contact [facility] at [phone] with questions or concerns regarding your invoice.   Our billing staff will not be able to assist you with questions regarding bills from these companies.  You will be contacted with the lab results as soon as they are available. The fastest way to get your results is to activate your My Chart account. Instructions are located on the last page of this paperwork. If you have not heard from [place] regarding the results in 2 weeks, please contact this office.         Signed, Merri Ray, MD [facility]

## 2019-11-02 LAB — COMPREHENSIVE METABOLIC PANEL
ALT: 35 IU/L (ref 0–44)
AST: 17 IU/L (ref 0–40)
Albumin/Globulin Ratio: 1.8 (ref 1.2–2.2)
Albumin: 4.6 g/dL (ref 4.0–5.0)
Alkaline Phosphatase: 68 IU/L (ref 39–117)
BUN/Creatinine Ratio: 16 (ref 9–20)
BUN: 17 mg/dL (ref 6–24)
Bilirubin Total: 1.1 mg/dL (ref 0.0–1.2)
CO2: 24 mmol/L (ref 20–29)
Calcium: 9.5 mg/dL (ref 8.7–10.2)
Chloride: 103 mmol/L (ref 96–106)
Creatinine, Ser: 1.05 mg/dL (ref 0.76–1.27)
GFR calc Af Amer: 95 mL/min/{1.73_m2} (ref >59)
GFR calc non Af Amer: 82 mL/min/{1.73_m2} (ref >59)
Globulin, Total: 2.5 g/dL (ref 1.5–4.5)
Glucose: 85 mg/dL (ref 65–99)
Potassium: 4.7 mmol/L (ref 3.5–5.2)
Sodium: 140 mmol/L (ref 134–144)
Total Protein: 7.1 g/dL (ref 6.0–8.5)

## 2019-11-02 LAB — LIPID PANEL
Chol/HDL Ratio: 3.7 ratio (ref 0.0–5.0)
Cholesterol, Total: 128 mg/dL (ref 100–199)
HDL: 35 mg/dL — ABNORMAL LOW (ref >39)
LDL Chol Calc (NIH): 70 mg/dL (ref 0–99)
Triglycerides: 125 mg/dL (ref 0–149)
VLDL Cholesterol Cal: 23 mg/dL (ref 5–40)

## 2019-11-02 LAB — HEMOGLOBIN A1C
Est. average glucose Bld gHb Est-mCnc: 154 mg/dL
Hgb A1c MFr Bld: 7 % — ABNORMAL HIGH (ref 4.8–5.6)

## 2019-11-28 DIAGNOSIS — H35033 Hypertensive retinopathy, bilateral: Secondary | ICD-10-CM | POA: Diagnosis not present

## 2019-11-28 DIAGNOSIS — E119 Type 2 diabetes mellitus without complications: Secondary | ICD-10-CM | POA: Diagnosis not present

## 2019-11-28 DIAGNOSIS — H524 Presbyopia: Secondary | ICD-10-CM | POA: Diagnosis not present

## 2019-11-28 DIAGNOSIS — D3131 Benign neoplasm of right choroid: Secondary | ICD-10-CM | POA: Diagnosis not present

## 2019-11-28 DIAGNOSIS — D3132 Benign neoplasm of left choroid: Secondary | ICD-10-CM | POA: Diagnosis not present

## 2019-12-30 ENCOUNTER — Other Ambulatory Visit: Payer: Self-pay | Admitting: Family Medicine

## 2019-12-30 DIAGNOSIS — E1165 Type 2 diabetes mellitus with hyperglycemia: Secondary | ICD-10-CM

## 2019-12-30 NOTE — Telephone Encounter (Signed)
Requested Prescriptions  Pending Prescriptions Disp Refills   TRULICITY 0.80 EM/3.3KP SOPN [Pharmacy Med Name: TRULICITY 2.24 SL/7.5 ML PEN] 6 mL 3    Sig: INJECT 0.75 MG INTO THE SKIN ONCE A WEEK.     Endocrinology:  Diabetes - GLP-1 Receptor Agonists Passed - 12/30/2019  9:32 AM      Passed - HBA1C is between 0 and 7.9 and within 180 days    Hgb A1c MFr Bld  Date Value Ref Range Status  11/01/2019 7.0 (H) 4.8 - 5.6 % Final    Comment:             Prediabetes: 5.7 - 6.4          Diabetes: >6.4          Glycemic control for adults with diabetes: <7.0          Passed - Valid encounter within last 6 months    Recent Outpatient Visits          1 month ago Type 2 diabetes mellitus with hyperglycemia, without long-term current use of insulin [facility])   [facility] at [place], [place]   5 months ago Type 2 diabetes mellitus with hyperglycemia, without long-term current use of insulin [facility])   [facility] at [place] [facility], Ranell Patrick, MD   8 months ago Type 2 diabetes mellitus with hyperglycemia, without long-term current use of insulin [facility])   Primary Care at [place], [place]   11 months ago Hyperlipidemia, unspecified hyperlipidemia type   [facility] at [place], Ranell Patrick, MD   1 year ago Type 2 diabetes mellitus without complication, without long-term current use of insulin [facility])   Primary Care at [place], Ranell Patrick, MD      Future Appointments            In 4 months Carlota Raspberry Ranell Patrick, MD Primary Care at [place], [facility]

## 2020-02-08 DIAGNOSIS — H60332 Swimmer's ear, left ear: Secondary | ICD-10-CM | POA: Diagnosis not present

## 2020-02-11 ENCOUNTER — Other Ambulatory Visit: Payer: Self-pay | Admitting: Endocrinology

## 2020-03-03 ENCOUNTER — Other Ambulatory Visit: Payer: Self-pay

## 2020-03-03 ENCOUNTER — Encounter: Payer: Self-pay | Admitting: Endocrinology

## 2020-03-03 ENCOUNTER — Ambulatory Visit (INDEPENDENT_AMBULATORY_CARE_PROVIDER_SITE_OTHER): Payer: BC Managed Care – PPO | Admitting: Endocrinology

## 2020-03-03 VITALS — BP 142/80 | HR 97 | Ht 76.0 in | Wt 252.8 lb

## 2020-03-03 DIAGNOSIS — C73 Malignant neoplasm of thyroid gland: Secondary | ICD-10-CM

## 2020-03-03 DIAGNOSIS — E213 Hyperparathyroidism, unspecified: Secondary | ICD-10-CM | POA: Diagnosis not present

## 2020-03-03 DIAGNOSIS — E890 Postprocedural hypothyroidism: Secondary | ICD-10-CM | POA: Diagnosis not present

## 2020-03-03 NOTE — Patient Instructions (Addendum)
Your blood pressure is high today.  Please see your primary care provider soon, to have it rechecked.  Blood tests are requested for you today.  We'll let you know about the results.  If the thyroid antibody is still there, we should check the ultrasound.   Please come back for a follow-up appointment in 6 months

## 2020-03-03 NOTE — Progress Notes (Signed)
Subjective:    Patient ID: Tyrone Lee, male    DOB: 08-20-1970, 49 y.o.   MRN: [id number]  HPI PTC,stage 1, with this chronology.  1/20: thyroidectomy and RND: multifocal PTC, largest focus is 3.2 cm (T2N1).   2/20: RAI, 127 mCi 2/20: post-therapy scan: small focus in the neck.   8/20 Tg undetectable (ab pos).   He denies neck swelling, pain, or nodule.  Hyperparathyroidism (dx'ed 2019; parathyroid tissue was in the 1/20 surgical specimen).  He no longer takes Vit-D.   Postsurgical hypothyroidism: he takes synthroid as rx'ed.   Past Medical History:  Diagnosis Date  . Acid reflux   . Diabetes mellitus without complication ([place])   . Hemorrhoids   . Hypertension     Past Surgical History:  Procedure Laterality Date  . RADICAL NECK DISSECTION Right 07/19/2018   Procedure: RIGHT NECK DISSECTION;  Surgeon: Rozetta Nunnery, MD;  Location: [place];  Service: ENT;  Laterality: Right;  . THYROIDECTOMY Right 07/19/2018   Procedure: TOTAL THYROIDECTOMY AND RIGHT NECK DISSECTION;  Surgeon: Rozetta Nunnery, MD;  [facility];  Service: ENT;  Laterality: Right;    Social History   Socioeconomic History  . Marital status: Married    Spouse name: Not on file  . Number of children: 2  . Years of education: Not on file  . Highest education level: Not on file  Occupational History  . Not on file  Tobacco Use  . Smoking status: Never Smoker  . Smokeless tobacco: Never Used  Vaping Use  . Vaping Use: Never used  Substance and Sexual Activity  . Alcohol use: Yes    Comment: weekends, occasional  . Drug use: No  . Sexual activity: Not on file  Other Topics Concern  . Not on file  Social History Narrative  . Not on file   Social Determinants of Health   Financial Resource Strain:   . Difficulty of Paying Living Expenses: Not on file  Food Insecurity:   . Worried About Charity fundraiser in the Last Year: Not on file  . Ran Out of Food in the Last Year: Not on  file  Transportation Needs:   . Lack of Transportation (Medical): Not on file  . Lack of Transportation (Non-Medical): Not on file  Physical Activity:   . Days of Exercise per Week: Not on file  . Minutes of Exercise per Session: Not on file  Stress:   . Feeling of Stress : Not on file  Social Connections:   . Frequency of Communication with Friends and Family: Not on file  . Frequency of Social Gatherings with Friends and Family: Not on file  . Attends Religious Services: Not on file  . Active Member of Clubs or Organizations: Not on file  . Attends Archivist Meetings: Not on file  . Marital Status: Not on file  Intimate Partner Violence:   . Fear of Current or Ex-Partner: Not on file  . Emotionally Abused: Not on file  . Physically Abused: Not on file  . Sexually Abused: Not on file    Current Outpatient Medications on File Prior to Visit  Medication Sig Dispense Refill  . ACCU-CHEK GUIDE test strip TEST UP TO FOUR TIMES A DAY AS DIRECTED (START WITH TESTING ONCE PER DAY) 400 each 1  . atorvastatin (LIPITOR) 20 MG tablet TAKE 1 TABLET (20 MG TOTAL) BY MOUTH DAILY AT 6 PM. 90 tablet 1  . blood glucose meter  kit and supplies Dispense based on patient and insurance preference. Use up to four times daily as directed. (FOR ICD-10 E10.9, E11.9). Once per day for now. 1 each 0  . CVS D3 50 MCG (2000 UT) CAPS TAKE 1 CAPSULE BY MOUTH EVERY DAY 100 capsule 3  . levothyroxine (SYNTHROID) 112 MCG tablet Take 2 tablets (224 mcg total) by mouth daily before breakfast. 180 tablet 3  . lisinopril (ZESTRIL) 10 MG tablet TAKE 1 TABLET BY MOUTH EVERY DAY 90 tablet 1  . metFORMIN (GLUCOPHAGE) 1000 MG tablet Take 1 tablet (1,000 mg total) by mouth 2 (two) times daily with a meal. 180 tablet 1  . omeprazole (PRILOSEC) 10 MG capsule Take 10 mg by mouth daily as needed (heartburn).     . TRULICITY 0.98 JX/9.1YN SOPN INJECT 0.75 MG INTO THE SKIN ONCE A WEEK. 6 mL 3   No current  facility-administered medications on file prior to visit.    No Known Allergies  Family History  Problem Relation Age of Onset  . Alcoholism Mother   . Drug abuse Mother   . Alcoholism Father   . Lung cancer Maternal Grandfather   . Lung cancer Paternal Grandfather   . Colon cancer Neg Hx   . Stomach cancer Neg Hx   . Esophageal cancer Neg Hx   . Thyroid disease Neg Hx     BP (!) 142/80   Pulse 97   Ht 6' 4"  (1.93 m)   Wt 252 lb 12.8 oz (114.7 kg)   SpO2 96%   BMI 30.77 kg/m    Review of Systems Denies numbness and muscle cramps    Objective:   Physical Exam VITAL SIGNS:  See vs page GENERAL: no distress Neck: a healed scar is present.  I do not appreciate a nodule in the thyroid or elsewhere in the neck.       Assessment & Plan:  HTN: is noted today Hypothyroidism: due for recheck Hyperparathyroidism.  Recheck today PTC: interpretation of TG has been limited by pos anti-TG antibody.   Patient Instructions  Your blood pressure is high today.  Please see your primary care provider soon, to have it rechecked.  Blood tests are requested for you today.  We'll let you know about the results.  If the thyroid antibody is still there, we should check the ultrasound.   Please come back for a follow-up appointment in 6 months

## 2020-03-04 ENCOUNTER — Other Ambulatory Visit: Payer: Self-pay | Admitting: Endocrinology

## 2020-03-04 DIAGNOSIS — E890 Postprocedural hypothyroidism: Secondary | ICD-10-CM

## 2020-03-04 DIAGNOSIS — C73 Malignant neoplasm of thyroid gland: Secondary | ICD-10-CM

## 2020-03-04 LAB — TSH: TSH: <0.01 u[IU]/mL — ABNORMAL LOW (ref 0.35–4.50)

## 2020-03-04 LAB — THYROGLOBULIN LEVEL: Thyroglobulin: <0.1 ng/mL — ABNORMAL LOW

## 2020-03-04 LAB — VITAMIN D 25 HYDROXY (VIT D DEFICIENCY, FRACTURES): VITD: 22.09 ng/mL — ABNORMAL LOW (ref 30.00–100.00)

## 2020-03-04 LAB — THYROGLOBULIN ANTIBODY: Thyroglobulin Ab: 111 IU/mL — ABNORMAL HIGH (ref <=1)

## 2020-03-04 LAB — T4, FREE: Free T4: 1.76 ng/dL — ABNORMAL HIGH (ref 0.60–1.60)

## 2020-03-04 LAB — PTH, INTACT AND CALCIUM
Calcium: 9.1 mg/dL (ref 8.6–10.3)
PTH: 17 pg/mL (ref 14–64)

## 2020-03-04 MED ORDER — LEVOTHYROXINE SODIUM 175 MCG PO TABS: 175.0000 ug | ORAL_TABLET | Freq: Every day | ORAL | 2 refills | Status: DC

## 2020-03-12 ENCOUNTER — Ambulatory Visit
Admission: RE | Admit: 2020-03-12 | Discharge: 2020-03-12 | Disposition: A | Discharge status: Home / Self Care | Payer: BC Managed Care – PPO | Source: Ambulatory Visit | Attending: Endocrinology | Admitting: Endocrinology

## 2020-03-12 DIAGNOSIS — C73 Malignant neoplasm of thyroid gland: Secondary | ICD-10-CM

## 2020-03-12 DIAGNOSIS — E890 Postprocedural hypothyroidism: Secondary | ICD-10-CM | POA: Diagnosis not present

## 2020-04-29 ENCOUNTER — Other Ambulatory Visit: Payer: Self-pay | Admitting: Family Medicine

## 2020-04-29 DIAGNOSIS — I10 Essential (primary) hypertension: Secondary | ICD-10-CM

## 2020-04-29 DIAGNOSIS — E785 Hyperlipidemia, unspecified: Secondary | ICD-10-CM

## 2020-04-29 DIAGNOSIS — E1165 Type 2 diabetes mellitus with hyperglycemia: Secondary | ICD-10-CM

## 2020-05-02 ENCOUNTER — Other Ambulatory Visit: Payer: Self-pay

## 2020-05-02 ENCOUNTER — Encounter: Payer: Self-pay | Admitting: Family Medicine

## 2020-05-02 ENCOUNTER — Ambulatory Visit (INDEPENDENT_AMBULATORY_CARE_PROVIDER_SITE_OTHER): Payer: BC Managed Care – PPO | Admitting: Family Medicine

## 2020-05-02 VITALS — BP 127/83 | HR 66 | Temp 98.4°F | Ht 76.0 in | Wt 247.0 lb

## 2020-05-02 DIAGNOSIS — I10 Essential (primary) hypertension: Secondary | ICD-10-CM | POA: Diagnosis not present

## 2020-05-02 DIAGNOSIS — Z1211 Encounter for screening for malignant neoplasm of colon: Secondary | ICD-10-CM

## 2020-05-02 DIAGNOSIS — Z23 Encounter for immunization: Secondary | ICD-10-CM

## 2020-05-02 DIAGNOSIS — E1165 Type 2 diabetes mellitus with hyperglycemia: Secondary | ICD-10-CM | POA: Diagnosis not present

## 2020-05-02 DIAGNOSIS — E785 Hyperlipidemia, unspecified: Secondary | ICD-10-CM

## 2020-05-02 MED ORDER — TRULICITY 0.75 MG/0.5ML ~~LOC~~ SOAJ: 0.7500 mg | SUBCUTANEOUS | 3 refills | Status: DC

## 2020-05-02 NOTE — Progress Notes (Signed)
Subjective:  Patient ID: Tyrone Lee, male    DOB: [birth year]  Age: 49 y.o. MRN: [id number]  CC:  Chief Complaint  Patient presents with  . Diabetes    Pt reports no issues with this condition since last OV. PT states when he checks his BS at home when he is fasting he gets a reading of 143m/dl and when non fasting gets a reading of 2094mdl. PT states his current medication seems to be working well with no side effects.    HPI [facility] for   Diabetes, With hyperglycemia. Metformin 1000 mg twice daily, Trulicity 0.1.84g weekly No side effects.  Fasting: around 100. No symptomatic lows.  2-hour postprandial, around 200  He is on ACE inhibitor as well as statin.  Microalbumin: Normal ratio July 2000 Optho, foot exam, pneumovax: Up-to-date.  Defers hepatitis C screening, HIV screening, and flu vaccine. covid vaccine - had and plan on booster.  Agrees to colonoscopy referral.   Lab Results  Component Value Date   HGBA1C 7.0 (H) 11/01/2019   HGBA1C 8.4 (H) 08/02/2019   HGBA1C 7.8 (H) 05/03/2019   Lab Results  Component Value Date   LDLCALC 70 11/01/2019   CREATININE 1.05 11/01/2019   Hyperlipidemia: lipitor 2066md, no new myalgias, side effects.  Lab Results  Component Value Date   CHOL 128 11/01/2019   HDL 35 (L) 11/01/2019   LDLCALC 70 11/01/2019   TRIG 125 11/01/2019   CHOLHDL 3.7 11/01/2019   Lab Results  Component Value Date   ALT 35 11/01/2019   AST 17 11/01/2019   ALKPHOS 68 11/01/2019   BILITOT 1.1 11/01/2019   Hypertension: Lisinopril 66m52m   - no new side effects.  Home readings: BP Readings from Last 3 Encounters:  05/02/20 127/83  03/03/20 (!) 142/80  11/01/19 131/90   Lab Results  Component Value Date   CREATININE 1.05 11/01/2019    History Patient Active Problem List   Diagnosis Date Noted  . Hyperparathyroidism (HCC)[place]/23/2020  . Hypothyroidism 07/27/2018  . Thyroid cancer (HCC)[place]/15/2020   Past Medical  History:  Diagnosis Date  . Acid reflux   . Diabetes mellitus without complication (HCC)[place]. Hemorrhoids   . Hypertension    Past Surgical History:  Procedure Laterality Date  . RADICAL NECK DISSECTION Right 07/19/2018   Procedure: RIGHT NECK DISSECTION;  Surgeon: NewmRozetta Nunnery;  [facility];  Laterality: Right;  . THYROIDECTOMY Right 07/19/2018   Procedure: TOTAL THYROIDECTOMY AND RIGHT NECK DISSECTION;  Surgeon: NewmRozetta Nunnery;  [facility] [place];  Laterality: Right;   No Known Allergies Prior to Admission medications   Medication Sig Start Date End Date Taking? Authorizing Provider  ACCU-CHEK GUIDE test strip TEST UP TO FOUR TIMES A DAY AS DIRECTED (START WITH TESTING ONCE PER DAY) 04/21/18  Yes GreeWendie Agreste  atorvastatin (LIPITOR) 20 MG tablet TAKE 1 TABLET (20 MG TOTAL) BY MOUTH DAILY AT 6 PM. 04/29/20  Yes GreeWendie Agreste  blood glucose meter kit and supplies Dispense based on patient and insurance preference. Use up to four times daily as directed. (FOR ICD-10 E10.9, E11.9). Once per day for now. 08/04/17  Yes GreeWendie Agreste  [facility]S D3 50 MCG (2000 UT) CAPS TAKE 1 CAPSULE BY MOUTH EVERY DAY 09/04/19  Yes ElliRenato Shin  levothyroxine (SYNTHROID) 175 MCG tablet Take 1 tablet (175 mcg total) by  mouth daily before breakfast. 03/04/20  Yes Renato Shin, MD  lisinopril (ZESTRIL) 10 MG tablet TAKE 1 TABLET BY MOUTH EVERY DAY 04/29/20  Yes Wendie Agreste, MD  metFORMIN (GLUCOPHAGE) 1000 MG tablet TAKE 1 TABLET (1,000 MG TOTAL) BY MOUTH 2 (TWO) TIMES DAILY WITH A MEAL. 04/29/20  Yes Wendie Agreste, MD  omeprazole (PRILOSEC) 10 MG capsule Take 10 mg by mouth daily as needed (heartburn).    Yes [provider]  TRULICITY 4.78 GN/5.6OZ SOPN INJECT 0.75 MG INTO THE SKIN ONCE A WEEK. 12/30/19  Yes Wendie Agreste, MD   Social History   Socioeconomic History  . Marital status: Married    Spouse name: Not  on file  . Number of children: 2  . Years of education: Not on file  . Highest education level: Not on file  Occupational History  . Not on file  Tobacco Use  . Smoking status: Never Smoker  . Smokeless tobacco: Never Used  Vaping Use  . Vaping Use: Never used  Substance and Sexual Activity  . Alcohol use: Yes    Comment: weekends, occasional  . Drug use: No  . Sexual activity: Not on file  Other Topics Concern  . Not on file  Social History Narrative  . Not on file   Social Determinants of Health   Financial Resource Strain:   . Difficulty of Paying Living Expenses: Not on file  Food Insecurity:   . Worried About Charity fundraiser in the Last Year: Not on file  . Ran Out of Food in the Last Year: Not on file  Transportation Needs:   . Lack of Transportation (Medical): Not on file  . Lack of Transportation (Non-Medical): Not on file  Physical Activity:   . Days of Exercise per Week: Not on file  . Minutes of Exercise per Session: Not on file  Stress:   . Feeling of Stress : Not on file  Social Connections:   . Frequency of Communication with Friends and Family: Not on file  . Frequency of Social Gatherings with Friends and Family: Not on file  . Attends Religious Services: Not on file  . Active Member of Clubs or Organizations: Not on file  . Attends Archivist Meetings: Not on file  . Marital Status: Not on file  Intimate Partner Violence:   . Fear of Current or Ex-Partner: Not on file  . Emotionally Abused: Not on file  . Physically Abused: Not on file  . Sexually Abused: Not on file    Review of Systems  Constitutional: Negative for fatigue and unexpected weight change.  Eyes: Negative for visual disturbance.  Respiratory: Negative for cough, chest tightness and shortness of breath.   Cardiovascular: Negative for chest pain, palpitations and leg swelling.  Gastrointestinal: Negative for abdominal pain and blood in stool.  Neurological: Negative  for dizziness, light-headedness and headaches.     Objective:   Vitals:   05/02/20 1531  BP: 127/83  Pulse: 66  Temp: 98.4 F (36.9 C)  TempSrc: Temporal  SpO2: 99%  Weight: 247 lb (112 kg)  Height: _0  (1.93 m)     Physical Exam Vitals reviewed.  Constitutional:      Appearance: He is well-developed.  HENT:     Head: Normocephalic and atraumatic.  Eyes:     Pupils: Pupils are equal, round, and reactive to light.  Neck:     Vascular: No carotid bruit or JVD.  Cardiovascular:  Rate and Rhythm: Normal rate and regular rhythm.     Heart sounds: Normal heart sounds. No murmur heard.   Pulmonary:     Effort: Pulmonary effort is normal.     Breath sounds: Normal breath sounds. No rales.  Skin:    General: Skin is warm and dry.  Neurological:     Mental Status: He is alert and oriented to person, place, and time.        Assessment & Plan:  Tyrone Lee is a 49 y.o. male . Type 2 diabetes mellitus with hyperglycemia, without long-term current use of insulin (HCC) - Plan: Hemoglobin A1c, Dulaglutide (TRULICITY) 9.41 DE/0.8XK SOPN  -  tolerating current regimen. Medications refilled. Labs pending as above. Consider higher dose trulicity with elevated postprandials, but would need to watch for hypoglycemia. a1c pending. 6 month follow up.   Hyperlipidemia, unspecified hyperlipidemia type - Plan: Lipid panel, Comprehensive metabolic panel  -  Stable, tolerating current regimen. Medications refilled. Labs pending as above.   Essential hypertension - Plan: Lipid panel, Comprehensive metabolic panel  -  Stable, tolerating current regimen. Medications refilled. Labs pending as above.   Need for vaccination - Plan: Varicella-zoster vaccine IM  Screen for colon cancer - Plan: Ambulatory referral to Gastroenterology    Meds ordered this encounter  Medications  . Dulaglutide (TRULICITY) 4.81 EH/6.3JS SOPN    Sig: Inject 0.75 mg into the skin once a week.     Dispense:  6 mL    Refill:  3   Patient Instructions     Depending on A1c, could change dose of trulicity - I will let you know.   If you have lab work done today you will be contacted with your lab results within the next 2 weeks.  If you have not heard from [place] then please contact us. The fastest way to get your results is to register for My Chart.   IF you received an x-ray today, you will receive an invoice from [facility]. Please contact [facility] at [phone] with questions or concerns regarding your invoice.   IF you received labwork today, you will receive an invoice from [place]. Please contact [facility] at [phone] with questions or concerns regarding your invoice.   Our billing staff will not be able to assist you with questions regarding bills from these companies.  You will be contacted with the lab results as soon as they are available. The fastest way to get your results is to activate your My Chart account. Instructions are located on the last page of this paperwork. If you have not heard from [place] regarding the results in 2 weeks, please contact this office.         Signed, Merri Ray, MD [facility]

## 2020-05-02 NOTE — Patient Instructions (Addendum)
   Depending on A1c, could change dose of trulicity - I will let you know.   If you have lab work done today you will be contacted with your lab results within the next 2 weeks.  If you have not heard from [place] then please contact us. The fastest way to get your results is to register for My Chart.   IF you received an x-ray today, you will receive an invoice from [facility]. Please contact [facility] at [phone] with questions or concerns regarding your invoice.   IF you received labwork today, you will receive an invoice from [place]. Please contact [facility] at [phone] with questions or concerns regarding your invoice.   Our billing staff will not be able to assist you with questions regarding bills from these companies.  You will be contacted with the lab results as soon as they are available. The fastest way to get your results is to activate your My Chart account. Instructions are located on the last page of this paperwork. If you have not heard from [place] regarding the results in 2 weeks, please contact this office.

## 2020-05-03 LAB — COMPREHENSIVE METABOLIC PANEL
ALT: 36 IU/L (ref 0–44)
AST: 19 IU/L (ref 0–40)
Albumin/Globulin Ratio: 1.8 (ref 1.2–2.2)
Albumin: 4.7 g/dL (ref 3.8–4.9)
Alkaline Phosphatase: 67 IU/L (ref 44–121)
BUN/Creatinine Ratio: 12 (ref 9–20)
BUN: 13 mg/dL (ref 6–24)
Bilirubin Total: 1.1 mg/dL (ref 0.0–1.2)
CO2: 24 mmol/L (ref 20–29)
Calcium: 9.5 mg/dL (ref 8.7–10.2)
Chloride: 104 mmol/L (ref 96–106)
Creatinine, Ser: 1.06 mg/dL (ref 0.76–1.27)
GFR calc Af Amer: 93 mL/min/{1.73_m2} (ref >59)
GFR calc non Af Amer: 81 mL/min/{1.73_m2} (ref >59)
Globulin, Total: 2.6 g/dL (ref 1.5–4.5)
Glucose: 85 mg/dL (ref 65–99)
Potassium: 4 mmol/L (ref 3.5–5.2)
Sodium: 143 mmol/L (ref 134–144)
Total Protein: 7.3 g/dL (ref 6.0–8.5)

## 2020-05-03 LAB — LIPID PANEL
Chol/HDL Ratio: 4 ratio (ref 0.0–5.0)
Cholesterol, Total: 140 mg/dL (ref 100–199)
HDL: 35 mg/dL — ABNORMAL LOW (ref >39)
LDL Chol Calc (NIH): 85 mg/dL (ref 0–99)
Triglycerides: 108 mg/dL (ref 0–149)
VLDL Cholesterol Cal: 20 mg/dL (ref 5–40)

## 2020-05-03 LAB — HEMOGLOBIN A1C
Est. average glucose Bld gHb Est-mCnc: 146 mg/dL
Hgb A1c MFr Bld: 6.7 % — ABNORMAL HIGH (ref 4.8–5.6)

## 2020-05-14 ENCOUNTER — Encounter: Payer: Self-pay | Admitting: Endocrinology

## 2020-05-14 ENCOUNTER — Other Ambulatory Visit: Payer: Self-pay

## 2020-05-14 ENCOUNTER — Other Ambulatory Visit (INDEPENDENT_AMBULATORY_CARE_PROVIDER_SITE_OTHER): Payer: BC Managed Care – PPO

## 2020-05-14 DIAGNOSIS — E890 Postprocedural hypothyroidism: Secondary | ICD-10-CM

## 2020-05-14 LAB — T4, FREE: Free T4: 1.33 ng/dL (ref 0.60–1.60)

## 2020-05-14 LAB — TSH: TSH: 0.11 u[IU]/mL — ABNORMAL LOW (ref 0.35–4.50)

## 2020-05-14 MED ORDER — LEVOTHYROXINE SODIUM 175 MCG PO TABS: 175.0000 ug | ORAL_TABLET | Freq: Every day | ORAL | 2 refills | Status: DC

## 2020-05-27 ENCOUNTER — Other Ambulatory Visit: Payer: Self-pay | Admitting: Endocrinology

## 2020-08-19 ENCOUNTER — Other Ambulatory Visit: Payer: Self-pay

## 2020-08-19 ENCOUNTER — Ambulatory Visit (AMBULATORY_SURGERY_CENTER): Payer: BC Managed Care – PPO

## 2020-08-19 VITALS — Ht 76.0 in | Wt 260.0 lb

## 2020-08-19 DIAGNOSIS — Z1211 Encounter for screening for malignant neoplasm of colon: Secondary | ICD-10-CM

## 2020-08-19 NOTE — Progress Notes (Signed)
Pre visit completed via phone call;  Patient verified name, DOB, and address; No egg or soy allergy known to patient  No issues with past sedation with any surgeries or procedures No intubation problems in the past  No FH of Malignant Hyperthermia No diet pills per patient No home 02 use per patient  No blood thinners per patient  Pt denies issues with constipation  No A fib or A flutter  EMMI video via [place] 19 guidelines implemented in PV today with Pt and RN  Pt is fully vaccinated for Covid + booster; Pt denies loose or missing teeth;  Patient denies dentures, partials, dental implants, capped teeth;  Patient reports missing and bonded teeth; Coupon given to pt in PV today , Code to Pharmacy and  NO PA's for preps discussed with pt In PV today  Discussed with pt there will be an out-of-pocket cost for prep and that varies from $0 to 70 dollars  Due to the COVID-19 pandemic we are asking patients to follow certain guidelines.  Pt aware of COVID protocols and [facility] guidelines   Patient has requested to have an EGD at the same time as his direct colon; appt made for pt with Dr. Hilarie Fredrickson;

## 2020-08-21 ENCOUNTER — Encounter: Payer: BC Managed Care – PPO | Admitting: Internal Medicine

## 2020-09-02 ENCOUNTER — Encounter: Payer: BC Managed Care – PPO | Admitting: Internal Medicine

## 2020-09-05 ENCOUNTER — Other Ambulatory Visit: Payer: Self-pay

## 2020-09-05 ENCOUNTER — Ambulatory Visit (INDEPENDENT_AMBULATORY_CARE_PROVIDER_SITE_OTHER): Payer: BC Managed Care – PPO | Admitting: Endocrinology

## 2020-09-05 VITALS — BP 126/80 | HR 81 | Ht 73.0 in | Wt 252.6 lb

## 2020-09-05 DIAGNOSIS — E890 Postprocedural hypothyroidism: Secondary | ICD-10-CM

## 2020-09-05 DIAGNOSIS — C73 Malignant neoplasm of thyroid gland: Secondary | ICD-10-CM | POA: Diagnosis not present

## 2020-09-05 DIAGNOSIS — E213 Hyperparathyroidism, unspecified: Secondary | ICD-10-CM

## 2020-09-05 LAB — TSH: TSH: 0.11 u[IU]/mL — ABNORMAL LOW (ref 0.35–4.50)

## 2020-09-05 LAB — VITAMIN D 25 HYDROXY (VIT D DEFICIENCY, FRACTURES): VITD: 33.31 ng/mL (ref 30.00–100.00)

## 2020-09-05 LAB — T4, FREE: Free T4: 1.46 ng/dL (ref 0.60–1.60)

## 2020-09-05 NOTE — Patient Instructions (Addendum)
Blood tests are requested for you today.  We'll let you know about the results.  If the thyroid antibody is still there in the future, we can check the nuclear medicine scan then.    Please come back for a follow-up appointment in 6 months.

## 2020-09-05 NOTE — Progress Notes (Signed)
Subjective:    Patient ID: Tyrone Lee, male    DOB: 02-28-1971, 50 y.o.   MRN: [id number]  HPI PTC,stage 1, with this chronology.  1/20: thyroidectomy and RND: multifocal PTC, largest focus is 3.2 cm (T2N1).   2/20: RAI, 127 mCi 2/20: post-therapy scan: small focus in the neck.   8/20 Tg undetectable (ab pos).   8/20 Tg undetectable (ab pos). He denies neck swelling, pain, or nodule.   Hyperparathyroidism (dx'ed 2019; parathyroid tissue was in the 1/20 surgical specimen).  He takes Vit-D, 1000 units/d  Postsurgical hypothyroidism: he takes synthroid as rx'ed.  Past Medical History:  Diagnosis Date  . Acid reflux    on meds  . Diabetes mellitus without complication ([place])    on meds  . Hemorrhoids   . Hypertension     Past Surgical History:  Procedure Laterality Date  . RADICAL NECK DISSECTION Right 07/19/2018   Procedure: RIGHT NECK DISSECTION;  Surgeon: Rozetta Nunnery, MD;  Location: [place];  Service: ENT;  Laterality: Right;  . THYROIDECTOMY Right 07/19/2018   Procedure: TOTAL THYROIDECTOMY AND RIGHT NECK DISSECTION;  Surgeon: Rozetta Nunnery, MD;  Location: [facility] OR;  Service: ENT;  Laterality: Right;    Social History   Socioeconomic History  . Marital status: Married    Spouse name: Not on file  . Number of children: 2  . Years of education: Not on file  . Highest education level: Not on file  Occupational History  . Not on file  Tobacco Use  . Smoking status: Never Smoker  . Smokeless tobacco: Never Used  Vaping Use  . Vaping Use: Never used  Substance and Sexual Activity  . Alcohol use: Yes    Comment: weekends, occasional  . Drug use: No  . Sexual activity: Not on file  Other Topics Concern  . Not on file  Social History Narrative  . Not on file   Social Determinants of Health   Financial Resource Strain: Not on file  Food Insecurity: Not on file  Transportation Needs: Not on file  Physical Activity: Not on file  Stress: Not on  file  Social Connections: Not on file  Intimate Partner Violence: Not on file    Current Outpatient Medications on File Prior to Visit  Medication Sig Dispense Refill  . ACCU-CHEK GUIDE test strip TEST UP TO FOUR TIMES A DAY AS DIRECTED (START WITH TESTING ONCE PER DAY) 400 each 1  . atorvastatin (LIPITOR) 20 MG tablet TAKE 1 TABLET (20 MG TOTAL) BY MOUTH DAILY AT 6 PM. 90 tablet 1  . blood glucose meter kit and supplies Dispense based on patient and insurance preference. Use up to four times daily as directed. (FOR ICD-10 E10.9, E11.9). Once per day for now. 1 each 0  . CVS D3 50 MCG (2000 UT) CAPS TAKE 1 CAPSULE BY MOUTH EVERY DAY 100 capsule 3  . Dulaglutide (TRULICITY) 3.74 MO/7.0BE SOPN Inject 0.75 mg into the skin once a week. 6 mL 3  . levothyroxine (SYNTHROID) 175 MCG tablet TAKE 1 TABLET BY MOUTH DAILY BEFORE BREAKFAST. 90 tablet 1  . lisinopril (ZESTRIL) 10 MG tablet TAKE 1 TABLET BY MOUTH EVERY DAY 90 tablet 1  . metFORMIN (GLUCOPHAGE) 1000 MG tablet TAKE 1 TABLET (1,000 MG TOTAL) BY MOUTH 2 (TWO) TIMES DAILY WITH A MEAL. 180 tablet 1  . omeprazole (PRILOSEC) 10 MG capsule Take 10 mg by mouth daily as needed (heartburn).      No current  facility-administered medications on file prior to visit.    No Known Allergies  Family History  Problem Relation Age of Onset  . Alcoholism Mother   . Drug abuse Mother   . Alcoholism Father   . Lung cancer Maternal Grandfather   . Lung cancer Paternal Grandfather   . Colon cancer Neg Hx   . Stomach cancer Neg Hx   . Esophageal cancer Neg Hx   . Thyroid disease Neg Hx     BP 126/80 (BP Location: Right Arm, Patient Position: Sitting, Cuff Size: Large)   Pulse 81   Ht 6' 1"  (1.854 m)   Wt 252 lb 9.6 oz (114.6 kg)   SpO2 96%   BMI 33.33 kg/m    Review of Systems     Objective:   Physical Exam VITAL SIGNS:  See vs page GENERAL: no distress Neck: a healed scar is present.  I do not appreciate a nodule in the thyroid or  elsewhere in the neck    Lab Results  Component Value Date   TSH 0.11 (L) 09/05/2020       Assessment & Plan:  Hypothyroidism: well-replaced for this situation.  Please continue the same synthroid PTC: check labs today.   Patient Instructions  Blood tests are requested for you today.  We'll let you know about the results.  If the thyroid antibody is still there in the future, we can check the nuclear medicine scan then.    Please come back for a follow-up appointment in 6 months.

## 2020-09-08 LAB — THYROGLOBULIN ANTIBODY: Thyroglobulin Ab: 97 IU/mL — ABNORMAL HIGH (ref <=1)

## 2020-09-08 LAB — THYROGLOBULIN LEVEL: Thyroglobulin: <0.1 ng/mL — ABNORMAL LOW

## 2020-09-08 LAB — PTH, INTACT AND CALCIUM
Calcium: 9.4 mg/dL (ref 8.6–10.3)
PTH: 36 pg/mL (ref 14–64)

## 2020-10-08 ENCOUNTER — Encounter: Payer: Self-pay | Admitting: Internal Medicine

## 2020-10-08 ENCOUNTER — Ambulatory Visit (INDEPENDENT_AMBULATORY_CARE_PROVIDER_SITE_OTHER): Payer: BC Managed Care – PPO | Admitting: Internal Medicine

## 2020-10-08 VITALS — BP 130/80 | HR 76 | Ht 73.0 in | Wt 253.4 lb

## 2020-10-08 DIAGNOSIS — Z1211 Encounter for screening for malignant neoplasm of colon: Secondary | ICD-10-CM

## 2020-10-08 DIAGNOSIS — K219 Gastro-esophageal reflux disease without esophagitis: Secondary | ICD-10-CM

## 2020-10-08 DIAGNOSIS — R1319 Other dysphagia: Secondary | ICD-10-CM

## 2020-10-08 NOTE — Progress Notes (Signed)
Patient ID: Tyrone Lee, male   DOB: 07/12/1970, 50 y.o.   MRN: [id number] HPI: Tyrone Lee is a 50 year old male with a past medical history of GERD with prior upper endoscopy number of years ago requiring dilation, history of hemorrhoids, hypertension, hyperlipidemia and diabetes, thyroid cancer status post thyroidectomy who is seen in consult at the request of Dr. Carlota Raspberry to evaluate GERD and dysphagia but also for colorectal cancer screening.  He is here alone today.  Patient is known to me from October 2018 when he was seen for symptomatic internal hemorrhoids.  At that time he was having perianal swelling, pain, itching and fecal smearing.  He responded well with resolution of symptoms to 2 banding procedures.  He reports that he has intermittent issues with solid food dysphagia.  He reports that he has learned to eat slower and chew food thoroughly.  He reports that at times solid food will transit slowly into the esophagus and at other times stick.  He will push this through with drinking liquid.  He does not have to bring food back up.  No liquid dysphagia.  No odynophagia.  No weight loss.  He does not have much in the way of heartburn and he is not taking an antacid on a regular basis.  He will use omeprazole on occasion with trigger foods like pizza.  He also avoids eating within 2 hours of lying down.  Bowel movements have been regular.  No blood in stool or melena.  No family history of colon cancer.  He works as a Training and development officer.  He is single.  2 children.  Never tobacco user.  No alcohol intake.  Past Medical History:  Diagnosis Date  . Acid reflux    on meds  . Diabetes mellitus without complication ([place])    on meds  . Hemorrhoids   . Hyperlipidemia   . Hypertension     Past Surgical History:  Procedure Laterality Date  . RADICAL NECK DISSECTION Right 07/19/2018   Procedure: RIGHT NECK DISSECTION;  Surgeon: Rozetta Nunnery, MD;  Location: [place];  Service:  ENT;  Laterality: Right;  . THYROIDECTOMY Right 07/19/2018   Procedure: TOTAL THYROIDECTOMY AND RIGHT NECK DISSECTION;  Surgeon: Rozetta Nunnery, MD;  Location: [place];  Service: ENT;  Laterality: Right;    Outpatient Medications Prior to Visit  Medication Sig Dispense Refill  . ACCU-CHEK GUIDE test strip TEST UP TO FOUR TIMES A DAY AS DIRECTED (START WITH TESTING ONCE PER DAY) 400 each 1  . atorvastatin (LIPITOR) 20 MG tablet TAKE 1 TABLET (20 MG TOTAL) BY MOUTH DAILY AT 6 PM. 90 tablet 1  . blood glucose meter kit and supplies Dispense based on patient and insurance preference. Use up to four times daily as directed. (FOR ICD-10 E10.9, E11.9). Once per day for now. 1 each 0  . CVS D3 50 MCG (2000 UT) CAPS TAKE 1 CAPSULE BY MOUTH EVERY DAY 100 capsule 3  . Dulaglutide (TRULICITY) 3.32 RJ/1.8AC SOPN Inject 0.75 mg into the skin once a week. 6 mL 3  . levothyroxine (SYNTHROID) 175 MCG tablet TAKE 1 TABLET BY MOUTH DAILY BEFORE BREAKFAST. 90 tablet 1  . lisinopril (ZESTRIL) 10 MG tablet TAKE 1 TABLET BY MOUTH EVERY DAY 90 tablet 1  . metFORMIN (GLUCOPHAGE) 1000 MG tablet TAKE 1 TABLET (1,000 MG TOTAL) BY MOUTH 2 (TWO) TIMES DAILY WITH A MEAL. 180 tablet 1  . omeprazole (PRILOSEC) 10 MG capsule Take 10 mg by mouth  daily as needed (heartburn).      No facility-administered medications prior to visit.    No Known Allergies  Family History  Problem Relation Age of Onset  . Alcoholism Mother   . Drug abuse Mother   . Alcoholism Father   . Lung cancer Maternal Grandfather   . Lung cancer Paternal Grandfather   . Diabetes Maternal Grandmother   . Colon cancer Neg Hx   . Stomach cancer Neg Hx   . Esophageal cancer Neg Hx   . Thyroid disease Neg Hx     Social History   Tobacco Use  . Smoking status: Never Smoker  . Smokeless tobacco: Never Used  Vaping Use  . Vaping Use: Never used  Substance Use Topics  . Alcohol use: Yes    Comment: weekends, occasional  . Drug use: No     ROS: As per history of present illness, otherwise negative  BP 130/80 (BP Location: Left Arm, Patient Position: Sitting, Cuff Size: Large)   Pulse 76   Ht 6' 1"  (1.854 m)   Wt 253 lb 6 oz (114.9 kg)   BMI 33.43 kg/m  Constitutional: Well-developed and well-nourished. No distress. HEENT: Normocephalic and atraumatic. Conjunctivae are normal.  No scleral icterus. Cardiovascular: Normal rate, regular rhythm and intact distal pulses. No M/R/G Pulmonary/chest: Effort normal and breath sounds normal. No wheezing, rales or rhonchi. Abdominal: Soft, nontender, nondistended. Bowel sounds active throughout. There are no masses palpable. No hepatosplenomegaly. Extremities: no clubbing, cyanosis, or edema Neurological: Alert and oriented to person place and time. Skin: Skin is warm and dry.  Psychiatric: Normal mood and affect. Behavior is normal.  RELEVANT LABS AND IMAGING: CBC    Component Value Date/Time   WBC 5.0 07/17/2018 0852   RBC 4.84 07/17/2018 0852   HGB 14.2 07/17/2018 0852   HCT 43.5 07/17/2018 0852   PLT 135 (L) 07/17/2018 0852   MCV 89.9 07/17/2018 0852   MCH 29.3 07/17/2018 0852   MCHC 32.6 07/17/2018 0852   RDW 12.0 07/17/2018 0852   LYMPHSABS 2.0 06/16/2018 0630   MONOABS 0.5 06/16/2018 0630   EOSABS 0.2 06/16/2018 0630   BASOSABS 0.1 06/16/2018 0630    CMP     Component Value Date/Time   NA 143 05/02/2020 1701   K 4.0 05/02/2020 1701   CL 104 05/02/2020 1701   CO2 24 05/02/2020 1701   GLUCOSE 85 05/02/2020 1701   GLUCOSE 217 (H) 07/17/2018 0852   BUN 13 05/02/2020 1701   CREATININE 1.06 05/02/2020 1701   CALCIUM 9.4 09/05/2020 1619   PROT 7.3 05/02/2020 1701   ALBUMIN 4.7 05/02/2020 1701   AST 19 05/02/2020 1701   ALT 36 05/02/2020 1701   ALKPHOS 67 05/02/2020 1701   BILITOT 1.1 05/02/2020 1701   GFRNONAA 81 05/02/2020 1701   GFRAA 93 05/02/2020 1701    ASSESSMENT/PLAN: 50 year old male with a past medical history of GERD with prior upper  endoscopy number of years ago requiring dilation, history of hemorrhoids, hypertension, hyperlipidemia and diabetes, thyroid cancer status post thyroidectomy who is seen in consult at the request of Dr. Carlota Raspberry to evaluate GERD and dysphagia but also for colorectal cancer screening.   1.  GERD with dysphagia --his predominant symptom is intermittent solid food dysphagia.  He has heartburn on occasion but not on a daily basis.  He has made lifestyle and dietary modifications to improve his reflux symptoms.  He will use omeprazole on occasion.  I recommended upper endoscopy for evaluation and exclusion  of esophagitis, Barrett's esophagus and to consider dilation if esophageal stricture is found.  He was responsive to endoscopy and dilation about 10 years ago.  I do not have these records. --Upper endoscopy in the [place] with possible dilation --Antireflux diet and lifestyle modification  2.  Colon cancer screening --average risk.  Colonoscopy is recommended.  We discussed and reviewed the risk, benefits and alternatives to both upper and lower endoscopy and he is agreeable and wishes to proceed      CW:CBJSEG, Ranell Patrick, Md [address] [place] [address] [place],  [place] [address]

## 2020-10-08 NOTE — Patient Instructions (Addendum)
You have been scheduled for an endoscopy and colonoscopy. Please follow the written instructions given to you at your visit today. If you use inhalers (even only as needed), please bring them with you on the day of your procedure.  We have given you a sample of Clenpiq.  If you are age 52 or younger, your body mass index should be between 19-25. Your Body mass index is 33.43 kg/m. If this is out of the aformentioned range listed, please consider follow up with your Primary Care Provider.   Due to recent changes in healthcare laws, you may see the results of your imaging and laboratory studies on MyChart before your provider has had a chance to review them.  We understand that in some cases there may be results that are confusing or concerning to you. Not all laboratory results come back in the same time frame and the provider may be waiting for multiple results in order to interpret others.  Please give [place] 48 hours in order for your provider to thoroughly review all the results before contacting the office for clarification of your results.

## 2020-11-06 ENCOUNTER — Other Ambulatory Visit: Payer: Self-pay

## 2020-11-06 ENCOUNTER — Ambulatory Visit (INDEPENDENT_AMBULATORY_CARE_PROVIDER_SITE_OTHER): Payer: BC Managed Care – PPO | Admitting: Family Medicine

## 2020-11-06 VITALS — BP 132/80 | HR 77 | Temp 98.3°F | Resp 16 | Ht 73.0 in | Wt 253.8 lb

## 2020-11-06 DIAGNOSIS — I10 Essential (primary) hypertension: Secondary | ICD-10-CM | POA: Diagnosis not present

## 2020-11-06 DIAGNOSIS — Z23 Encounter for immunization: Secondary | ICD-10-CM

## 2020-11-06 DIAGNOSIS — E785 Hyperlipidemia, unspecified: Secondary | ICD-10-CM | POA: Diagnosis not present

## 2020-11-06 DIAGNOSIS — E1165 Type 2 diabetes mellitus with hyperglycemia: Secondary | ICD-10-CM | POA: Diagnosis not present

## 2020-11-06 MED ORDER — ATORVASTATIN CALCIUM 20 MG PO TABS: 20.0000 mg | ORAL_TABLET | Freq: Every day | ORAL | 1 refills | Status: DC

## 2020-11-06 MED ORDER — TRULICITY 0.75 MG/0.5ML ~~LOC~~ SOAJ: 0.7500 mg | SUBCUTANEOUS | 3 refills | Status: DC

## 2020-11-06 MED ORDER — LISINOPRIL 10 MG PO TABS: 1.0000 | ORAL_TABLET | Freq: Every day | ORAL | 1 refills | Status: DC

## 2020-11-06 MED ORDER — METFORMIN HCL 1000 MG PO TABS: 1000.0000 mg | ORAL_TABLET | Freq: Two times a day (BID) | ORAL | 1 refills | Status: DC

## 2020-11-06 NOTE — Progress Notes (Signed)
Subjective:  Patient ID: Tyrone Lee, male    DOB: 1970-08-21  Age: 50 y.o. MRN: [id number]  CC:  Chief Complaint  Patient presents with  . Diabetes    Pt following up on Diabetes, no concerns notes he will need refills today   . Immunizations     Pt due for second dose shingles vaccine, first done 04/2020    HPI Tyrone Lee presents for   Diabetes: Metformin 1696VE bid, trulicity 9.38BO qweek.  No new side effects.  Fasting: 100-150. No sx lows.  2hr PP - 126. Rare 200- once per week.  On statin and ace-I.  Microalbumin: normal July 2020.  Optho, foot exam, pneumovax:   Lab Results  Component Value Date   HGBA1C 6.7 (H) 05/02/2020   HGBA1C 7.0 (H) 11/01/2019   HGBA1C 8.4 (H) 08/02/2019   Lab Results  Component Value Date   LDLCALC 85 05/02/2020   CREATININE 1.06 05/02/2020   Hyperlipidemia: lipitor 50m qd - no new myalgias.  Lab Results  Component Value Date   CHOL 140 05/02/2020   HDL 35 (L) 05/02/2020   LDLCALC 85 05/02/2020   TRIG 108 05/02/2020   CHOLHDL 4.0 05/02/2020   Lab Results  Component Value Date   ALT 36 05/02/2020   AST 19 05/02/2020   ALKPHOS 67 05/02/2020   BILITOT 1.1 05/02/2020   Hypertension: Lisinopril 175mqd.  Home readings:none.  BP Readings from Last 3 Encounters:  11/06/20 132/80  10/08/20 130/80  09/05/20 126/80   Lab Results  Component Value Date   CREATININE 1.06 05/02/2020    HM: 2nd dose shingrix today.   History Patient Active Problem List   Diagnosis Date Noted  . Hyperparathyroidism ([facility]  . Hypothyroidism 07/27/2018  . Thyroid cancer ([facility] [place]01/15/2020   Past Medical History:  Diagnosis Date  . Acid reflux    on meds  . Diabetes mellitus without complication ([facility]   on meds  . Hemorrhoids   . Hyperlipidemia   . Hypertension    Past Surgical History:  Procedure Laterality Date  . RADICAL NECK DISSECTION Right 07/19/2018   Procedure: RIGHT NECK DISSECTION;  Surgeon:  NeRozetta NunneryMD;  [facility];  Laterality: Right;  . THYROIDECTOMY Right 07/19/2018   Procedure: TOTAL THYROIDECTOMY AND RIGHT NECK DISSECTION;  Surgeon: NeRozetta NunneryMD;  [place] [facility] [place];  Laterality: Right;   No Known Allergies Prior to Admission medications   Medication Sig Start Date End Date Taking? Authorizing Provider  ACCU-CHEK GUIDE test strip TEST UP TO FOUR TIMES A DAY AS DIRECTED (START WITH TESTING ONCE PER DAY) 04/21/18  Yes GrWendie AgresteMD  atorvastatin (LIPITOR) 20 MG tablet TAKE 1 TABLET (20 MG TOTAL) BY MOUTH DAILY AT 6 PM. 04/29/20  Yes GrWendie AgresteMD  blood glucose meter kit and supplies Dispense based on patient and insurance preference. Use up to four times daily as directed. (FOR ICD-10 E10.9, E11.9). Once per day for now. 08/04/17  Yes GrWendie AgresteMD  [employer] D3 50 MCG (2000 UT) CAPS TAKE 1 CAPSULE BY MOUTH EVERY DAY 09/04/19  Yes ElRenato ShinMD  Dulaglutide (TRULICITY) 0.1.75GZW/2.5ENOPN Inject 0.75 mg into the skin once a week. 05/02/20  Yes GrWendie AgresteMD  levothyroxine (SYNTHROID) 175 MCG tablet TAKE 1 TABLET BY MOUTH DAILY BEFORE BREAKFAST. 05/27/20  Yes ElRenato ShinMD  lisinopril (ZESTRIL) 10 MG tablet TAKE 1 TABLET  BY MOUTH EVERY DAY 04/29/20  Yes Wendie Agreste, MD  metFORMIN (GLUCOPHAGE) 1000 MG tablet TAKE 1 TABLET (1,000 MG TOTAL) BY MOUTH 2 (TWO) TIMES DAILY WITH A MEAL. 04/29/20  Yes Wendie Agreste, MD  omeprazole (PRILOSEC) 10 MG capsule Take 10 mg by mouth daily as needed (heartburn).    Yes [provider]   Social History   Socioeconomic History  . Marital status: Married    Spouse name: Not on file  . Number of children: 2  . Years of education: Not on file  . Highest education level: Not on file  Occupational History  . Not on file  Tobacco Use  . Smoking status: Never Smoker  . Smokeless tobacco: Never Used  Vaping Use  . Vaping Use: Never used   Substance and Sexual Activity  . Alcohol use: Yes    Comment: weekends, occasional  . Drug use: No  . Sexual activity: Not on file  Other Topics Concern  . Not on file  Social History Narrative  . Not on file   Social Determinants of Health   Financial Resource Strain: Not on file  Food Insecurity: Not on file  Transportation Needs: Not on file  Physical Activity: Not on file  Stress: Not on file  Social Connections: Not on file  Intimate Partner Violence: Not on file    Review of Systems  Constitutional: Negative for fatigue and unexpected weight change.  Eyes: Negative for visual disturbance.  Respiratory: Negative for cough, chest tightness and shortness of breath.   Cardiovascular: Negative for chest pain, palpitations and leg swelling.  Gastrointestinal: Negative for abdominal pain and blood in stool.  Neurological: Negative for dizziness, light-headedness and headaches.     Objective:   Vitals:   11/06/20 1624  BP: 132/80  Pulse: 77  Resp: 16  Temp: 98.3 F (36.8 C)  TempSrc: Temporal  SpO2: 100%  Weight: 253 lb 12.8 oz (115.1 kg)  Height: _0  (1.854 m)    Physical Exam Vitals reviewed.  Constitutional:      Appearance: He is well-developed.  HENT:     Head: Normocephalic and atraumatic.  Eyes:     Pupils: Pupils are equal, round, and reactive to light.  Neck:     Vascular: No carotid bruit or JVD.  Cardiovascular:     Rate and Rhythm: Normal rate and regular rhythm.     Heart sounds: Normal heart sounds. No murmur heard.   Pulmonary:     Effort: Pulmonary effort is normal.     Breath sounds: Normal breath sounds. No rales.  Skin:    General: Skin is warm and dry.  Neurological:     Mental Status: He is alert and oriented to person, place, and time.        Assessment & Plan:  Tyrone Lee is a 50 y.o. male . Type 2 diabetes mellitus with hyperglycemia, without long-term current use of insulin (HCC) - Plan: Comprehensive  metabolic panel, Hemoglobin A1c, Lipid panel, Microalbumin / creatinine urine ratio, Dulaglutide (TRULICITY) 2.45 YK/9.9IP SOPN, metFORMIN (GLUCOPHAGE) 1000 MG tablet  -Few elevated home readings, check A1c to determine control.  Tolerating current regimen with Trulicity, metformin, continue same.   Need for vaccination - Plan: Varicella-zoster vaccine IM  -Potential short-term side effects discussed.  Essential hypertension - Plan: Comprehensive metabolic panel, lisinopril (ZESTRIL) 10 MG tablet  -Stable, continue lisinopril  Hyperlipidemia, unspecified hyperlipidemia type - Plan: Comprehensive metabolic panel, Lipid panel, atorvastatin (LIPITOR) 20 MG  tablet  -Tolerating Lipitor, check labs.  Meds ordered this encounter  Medications  . lisinopril (ZESTRIL) 10 MG tablet    Sig: Take 1 tablet (10 mg total) by mouth daily.    Dispense:  90 tablet    Refill:  1  . Dulaglutide (TRULICITY) 3.52 YE/1.8HT SOPN    Sig: Inject 0.75 mg into the skin once a week.    Dispense:  6 mL    Refill:  3  . metFORMIN (GLUCOPHAGE) 1000 MG tablet    Sig: Take 1 tablet (1,000 mg total) by mouth 2 (two) times daily with a meal.    Dispense:  180 tablet    Refill:  1  . atorvastatin (LIPITOR) 20 MG tablet    Sig: Take 1 tablet (20 mg total) by mouth daily at 6 PM.    Dispense:  90 tablet    Refill:  1   There are no Patient Instructions on file for this visit.    Signed, Merri Ray, MD [facility]

## 2020-11-07 ENCOUNTER — Encounter: Payer: Self-pay | Admitting: Family Medicine

## 2020-11-07 LAB — MICROALBUMIN / CREATININE URINE RATIO
Creatinine,U: 151.1 mg/dL
Microalb Creat Ratio: 0.8 mg/g (ref 0.0–30.0)
Microalb, Ur: 1.2 mg/dL (ref 0.0–1.9)

## 2020-11-07 LAB — COMPREHENSIVE METABOLIC PANEL
ALT: 29 U/L (ref 0–53)
AST: 18 U/L (ref 0–37)
Albumin: 4.6 g/dL (ref 3.5–5.2)
Alkaline Phosphatase: 54 U/L (ref 39–117)
BUN: 18 mg/dL (ref 6–23)
CO2: 27 mEq/L (ref 19–32)
Calcium: 9.4 mg/dL (ref 8.4–10.5)
Chloride: 104 mEq/L (ref 96–112)
Creatinine, Ser: 1.04 mg/dL (ref 0.40–1.50)
GFR: 82.91 mL/min (ref >60.00)
Glucose, Bld: 95 mg/dL (ref 70–99)
Potassium: 3.7 mEq/L (ref 3.5–5.1)
Sodium: 140 mEq/L (ref 135–145)
Total Bilirubin: 0.9 mg/dL (ref 0.2–1.2)
Total Protein: 7.6 g/dL (ref 6.0–8.3)

## 2020-11-07 LAB — HEMOGLOBIN A1C: Hgb A1c MFr Bld: 6.8 % — ABNORMAL HIGH (ref 4.6–6.5)

## 2020-11-07 LAB — LIPID PANEL
Cholesterol: 133 mg/dL (ref 0–200)
HDL: 33.9 mg/dL — ABNORMAL LOW (ref >39.00)
LDL Cholesterol: 71 mg/dL (ref 0–99)
NonHDL: 99.08
Total CHOL/HDL Ratio: 4
Triglycerides: 138 mg/dL (ref 0.0–149.0)
VLDL: 27.6 mg/dL (ref 0.0–40.0)

## 2020-12-11 ENCOUNTER — Other Ambulatory Visit: Payer: Self-pay | Admitting: Endocrinology

## 2021-01-29 ENCOUNTER — Other Ambulatory Visit: Payer: Self-pay | Admitting: Internal Medicine

## 2021-01-29 ENCOUNTER — Other Ambulatory Visit: Payer: Self-pay

## 2021-01-29 ENCOUNTER — Ambulatory Visit (AMBULATORY_SURGERY_CENTER): Payer: BC Managed Care – PPO | Admitting: Internal Medicine

## 2021-01-29 ENCOUNTER — Encounter: Payer: Self-pay | Admitting: Internal Medicine

## 2021-01-29 VITALS — BP 113/74 | HR 60 | Temp 98.2°F | Resp 14 | Ht 73.0 in | Wt 253.0 lb

## 2021-01-29 DIAGNOSIS — K2100 Gastro-esophageal reflux disease with esophagitis, without bleeding: Secondary | ICD-10-CM

## 2021-01-29 DIAGNOSIS — K209 Esophagitis, unspecified without bleeding: Secondary | ICD-10-CM

## 2021-01-29 DIAGNOSIS — Z1211 Encounter for screening for malignant neoplasm of colon: Secondary | ICD-10-CM | POA: Diagnosis not present

## 2021-01-29 DIAGNOSIS — D122 Benign neoplasm of ascending colon: Secondary | ICD-10-CM

## 2021-01-29 DIAGNOSIS — R1310 Dysphagia, unspecified: Secondary | ICD-10-CM | POA: Diagnosis not present

## 2021-01-29 DIAGNOSIS — K219 Gastro-esophageal reflux disease without esophagitis: Secondary | ICD-10-CM

## 2021-01-29 MED ORDER — SODIUM CHLORIDE 0.9 % IV SOLN: 500.0000 mL | Freq: Once | INTRAVENOUS | Status: DC

## 2021-01-29 MED ORDER — OMEPRAZOLE 40 MG PO CPDR: DELAYED_RELEASE_CAPSULE | ORAL | 0 refills | Status: DC

## 2021-01-29 NOTE — Op Note (Signed)
[place] Patient Name: Tyrone Lee Procedure Date: 01/29/2021 7:26 AM MRN: [id number] Endoscopist: Jerene Bears , MD Age: 50 Referring MD:  Date of Birth: April 23, 1971 Gender: Male Account #: [id number] Procedure:                Upper GI endoscopy Indications:              Dysphagia, Suspected gastro-esophageal reflux                            disease Medicines:                Monitored Anesthesia Care Procedure:                Pre-Anesthesia Assessment:                           - Prior to the procedure, a History and Physical                            was performed, and patient medications and                            allergies were reviewed. The patient's tolerance of                            previous anesthesia was also reviewed. The risks                            and benefits of the procedure and the sedation                            options and risks were discussed with the patient.                            All questions were answered, and informed consent                            was obtained. Prior Anticoagulants: The patient has                            taken no previous anticoagulant or antiplatelet                            agents. ASA Grade Assessment: II - A patient with                            mild systemic disease. After reviewing the risks                            and benefits, the patient was deemed in                            satisfactory condition to undergo the procedure.  After obtaining informed consent, the endoscope was                            passed under direct vision. Throughout the                            procedure, the patient's blood pressure, pulse, and                            oxygen saturations were monitored continuously. The                            GIF [id number] was introduced through the                            mouth, and advanced to the second part of duodenum.                             The upper GI endoscopy was accomplished without                            difficulty. The patient tolerated the procedure                            well. Scope In: Scope Out: Findings:                 LA Grade A (one or more mucosal breaks less than 5                            mm, not extending between tops of 2 mucosal folds)                            esophagitis was found at the gastroesophageal                            junction. Z-line is slightly irregular but does not                            meet criteria for Barrett's. There was mild                            nodularity at the GE junction likely reflux                            related. Biopsies of the nodularity were taken with                            a cold forceps for histology. The scope was                            withdrawn. Dilation was performed with a Venia Minks  dilator with mild resistance at 54 Fr.                           The entire examined stomach was normal.                           The examined duodenum was normal. Complications:            No immediate complications. Estimated Blood Loss:     Estimated blood loss was minimal. Impression:               - Mild acid reflux esophagitis. Biopsied. Dilated                            with 54 Fr Maloney.                           - Normal stomach.                           - Normal examined duodenum. Recommendation:           - Patient has a contact number available for                            emergencies. The signs and symptoms of potential                            delayed complications were discussed with the                            patient. Return to normal activities tomorrow.                            Written discharge instructions were provided to the                            patient.                           - Resume previous diet.                           - Continue present medications.  Pantoprazole 40 mg                            or omeprazole 40 mg once daily is recommended.                           - Await pathology results.                           - If heartburn or trouble swallowing persist with                            this medication and after the dilation please let  me know by calling the office. Jerene Bears, MD 01/29/2021 8:47:41 AM This report has been signed electronically.

## 2021-01-29 NOTE — Progress Notes (Signed)
0753 Robinul 0.1 mg IV given due large amount of secretions upon assessment.  MD made aware, vss

## 2021-01-29 NOTE — Progress Notes (Signed)
Report given to PACU, vss 

## 2021-01-29 NOTE — Progress Notes (Signed)
Called to room to assist during endoscopic procedure.  Patient ID and intended procedure confirmed with present staff. Received instructions for my participation in the procedure from the performing physician.  

## 2021-01-29 NOTE — Patient Instructions (Signed)
Handouts given for esophagitis and stricture, polyps and hemorrhoids.  Await pathology results.  Pick up your new prescription today and begin taking tomorrow am.  YOU HAD AN ENDOSCOPIC PROCEDURE TODAY AT [place]:   Refer to the procedure report that was given to you for any specific questions about what was found during the examination.  If the procedure report does not answer your questions, please call your gastroenterologist to clarify.  If you requested that your care partner not be given the details of your procedure findings, then the procedure report has been included in a sealed envelope for you to review at your convenience later.  YOU SHOULD EXPECT: Some feelings of bloating in the abdomen. Passage of more gas than usual.  Walking can help get rid of the air that was put into your GI tract during the procedure and reduce the bloating. If you had a lower endoscopy (such as a colonoscopy or flexible sigmoidoscopy) you may notice spotting of blood in your stool or on the toilet paper. If you underwent a bowel prep for your procedure, you may not have a normal bowel movement for a few days.  Please Note:  You might notice some irritation and congestion in your nose or some drainage.  This is from the oxygen used during your procedure.  There is no need for concern and it should clear up in a day or so.  SYMPTOMS TO REPORT IMMEDIATELY:  Following lower endoscopy (colonoscopy or flexible sigmoidoscopy):  Excessive amounts of blood in the stool  Significant tenderness or worsening of abdominal pains  Swelling of the abdomen that is new, acute  Fever of 100F or higher  Following upper endoscopy (EGD)  Vomiting of blood or coffee ground material  New chest pain or pain under the shoulder blades  Painful or persistently difficult swallowing  New shortness of breath  Fever of 100F or higher  Black, tarry-looking stools  For urgent or emergent issues, a  gastroenterologist can be reached at any hour by calling [phone]. Do not use MyChart messaging for urgent concerns.    DIET:  We do recommend a small meal at first, but then you may proceed to your regular diet.  Drink plenty of fluids but you should avoid alcoholic beverages for 24 hours.  ACTIVITY:  You should plan to take it easy for the rest of today and you should NOT DRIVE or use heavy machinery until tomorrow (because of the sedation medicines used during the test).    FOLLOW UP: Our staff will call the number listed on your records 48-72 hours following your procedure to check on you and address any questions or concerns that you may have regarding the information given to you following your procedure. If we do not reach you, we will leave a message.  We will attempt to reach you two times.  During this call, we will ask if you have developed any symptoms of COVID 19. If you develop any symptoms (ie: fever, flu-like symptoms, shortness of breath, cough etc.) before then, please call [phone].  If you test positive for Covid 19 in the 2 weeks post procedure, please call and report this information to [place].    If any biopsies were taken you will be contacted by phone or by letter within the next 1-3 weeks.  Please call us at [phone] if you have not heard about the biopsies in 3 weeks.    SIGNATURES/CONFIDENTIALITY: You and/or your care partner have  signed paperwork which will be entered into your electronic medical record.  These signatures attest to the fact that that the information above on your After Visit Summary has been reviewed and is understood.  Full responsibility of the confidentiality of this discharge information lies with you and/or your care-partner.

## 2021-01-29 NOTE — Op Note (Signed)
[place] Patient Name: Tyrone Lee Procedure Date: 01/29/2021 7:26 AM MRN: [id number] Endoscopist: Jerene Bears , MD Age: 50 Referring MD:  Date of Birth: [birth year] Gender: Male Account #: [id number] Procedure:                Colonoscopy Indications:              Screening for colorectal malignant neoplasm Medicines:                Monitored Anesthesia Care Procedure:                Pre-Anesthesia Assessment:                           - Prior to the procedure, a History and Physical                            was performed, and patient medications and                            allergies were reviewed. The patient's tolerance of                            previous anesthesia was also reviewed. The risks                            and benefits of the procedure and the sedation                            options and risks were discussed with the patient.                            All questions were answered, and informed consent                            was obtained. Prior Anticoagulants: The patient has                            taken no previous anticoagulant or antiplatelet                            agents. ASA Grade Assessment: II - A patient with                            mild systemic disease. After reviewing the risks                            and benefits, the patient was deemed in                            satisfactory condition to undergo the procedure.                           After obtaining informed consent, the colonoscope  was passed under direct vision. Throughout the                            procedure, the patient's blood pressure, pulse, and                            oxygen saturations were monitored continuously. The                            CF [id number] was introduced through the anus                            and advanced to the cecum, identified by                            appendiceal orifice and  ileocecal valve. The                            colonoscopy was performed without difficulty. The                            patient tolerated the procedure well. The quality                            of the bowel preparation was good. The ileocecal                            valve, appendiceal orifice, and rectum were                            photographed. Scope In: 8:23:25 AM Scope Out: 8:38:25 AM Scope Withdrawal Time: 0 hours 13 minutes 39 seconds  Total Procedure Duration: 0 hours 15 minutes 0 seconds  Findings:                 The digital rectal exam was normal.                           Two sessile polyps were found in the ascending                            colon. The polyps were 3 to 4 mm in size. These                            polyps were removed with a cold snare. Resection                            and retrieval were complete.                           Internal hemorrhoids were found during retroflexion                            and post-banding scarring. The hemorrhoids were  small.                           The exam was otherwise without abnormality. Complications:            No immediate complications. Estimated Blood Loss:     Estimated blood loss was minimal. Impression:               - Two 3 to 4 mm polyps in the ascending colon,                            removed with a cold snare. Resected and retrieved.                           - Small internal hemorrhoids with post-banding                            scars visible in the rectum.                           - The examination was otherwise normal. Recommendation:           - Patient has a contact number available for                            emergencies. The signs and symptoms of potential                            delayed complications were discussed with the                            patient. Return to normal activities tomorrow.                            Written discharge  instructions were provided to the                            patient.                           - Resume previous diet.                           - Continue present medications.                           - Await pathology results.                           - Repeat colonoscopy is recommended. The                            colonoscopy date will be determined after pathology                            results from today's exam become available for  review. Jerene Bears, MD 01/29/2021 8:49:44 AM This report has been signed electronically.

## 2021-02-02 ENCOUNTER — Telehealth: Payer: Self-pay | Admitting: *Deleted

## 2021-02-02 NOTE — Telephone Encounter (Signed)
  Follow up Call-  Call back number 01/29/2021  Post procedure Call Back phone  # [phone]  Permission to leave phone message Yes  Some recent data might be hidden     Patient questions:  Do you have a fever, pain , or abdominal swelling? No. Pain Score  0 *  Have you tolerated food without any problems? Yes.    Have you been able to return to your normal activities? Yes.    Do you have any questions about your discharge instructions: Diet   No. Medications  No. Follow up visit  No.  Do you have questions or concerns about your Care? No.  Actions: * If pain score is 4 or above: No action needed, pain <4.  Have you developed a fever since your procedure? no  2.   Have you had an respiratory symptoms (SOB or cough) since your procedure? no  3.   Have you tested positive for COVID 19 since your procedure no  4.   Have you had any family members/close contacts diagnosed with the COVID 19 since your procedure?  no   If yes to any of these questions please route to Joylene John, RN and Joella Prince, RN

## 2021-02-02 NOTE — Telephone Encounter (Signed)
  Follow up Call-  Call back number 01/29/2021  Post procedure Call Back phone  # [phone]  Permission to leave phone message Yes  Some recent data might be hidden     First attempt for follow up phone call. No answer at number given.  Left message on voicemail.

## 2021-02-20 ENCOUNTER — Other Ambulatory Visit: Payer: Self-pay | Admitting: Internal Medicine

## 2021-02-20 DIAGNOSIS — K209 Esophagitis, unspecified without bleeding: Secondary | ICD-10-CM

## 2021-02-26 ENCOUNTER — Encounter: Payer: Self-pay | Admitting: Internal Medicine

## 2021-03-13 ENCOUNTER — Encounter: Payer: Self-pay | Admitting: Endocrinology

## 2021-03-13 ENCOUNTER — Ambulatory Visit (INDEPENDENT_AMBULATORY_CARE_PROVIDER_SITE_OTHER): Payer: BC Managed Care – PPO | Admitting: Endocrinology

## 2021-03-13 ENCOUNTER — Other Ambulatory Visit: Payer: Self-pay

## 2021-03-13 VITALS — BP 110/68 | HR 103 | Ht 76.0 in | Wt 253.6 lb

## 2021-03-13 DIAGNOSIS — C73 Malignant neoplasm of thyroid gland: Secondary | ICD-10-CM

## 2021-03-13 LAB — TSH: TSH: 0.3 u[IU]/mL — ABNORMAL LOW (ref 0.35–5.50)

## 2021-03-13 LAB — T4, FREE: Free T4: 1.36 ng/dL (ref 0.60–1.60)

## 2021-03-13 NOTE — Progress Notes (Signed)
Subjective:    Patient ID: Tyrone Lee, male    DOB: [birth year], 50 y.o.   MRN: [id number]  HPI PTC,stage 1, with this chronology.  1/20: thyroidectomy and RND: multifocal PTC, largest focus is 3.2 cm (T2N1).   2/20: RAI, 127 mCi 2/20: post-therapy scan: small focus in the neck.   8/20 Tg undetectable (ab pos).   8/21 Tg undetectable (ab pos). 3/22 Tg undetectable (ab pos). He denies neck swelling, pain, or nodule.   Hyperparathyroidism (dx'ed 2019; parathyroid tissue was in the 1/20 surgical specimen; labs have been normal since).  He takes Vit-D, 1000 units/d.   Postsurgical hypothyroidism: he takes synthroid as rx'ed.   Past Medical History:  Diagnosis Date   Acid reflux    on meds   Diabetes mellitus without complication ([place])    on meds   Hemorrhoids    Hyperlipidemia    Hypertension     Past Surgical History:  Procedure Laterality Date   RADICAL NECK DISSECTION Right 07/19/2018   Procedure: RIGHT NECK DISSECTION;  Surgeon: Rozetta Nunnery, MD;  Location: [facility] OR;  Service: ENT;  Laterality: Right;   THYROIDECTOMY Right 07/19/2018   Procedure: TOTAL THYROIDECTOMY AND RIGHT NECK DISSECTION;  Surgeon: Rozetta Nunnery, MD;  Location: [facility] OR;  Service: ENT;  Laterality: Right;    Social History   Socioeconomic History   Marital status: Married    Spouse name: Not on file   Number of children: 2   Years of education: Not on file   Highest education level: Not on file  Occupational History   Not on file  Tobacco Use   Smoking status: Never   Smokeless tobacco: Never  Vaping Use   Vaping Use: Never used  Substance and Sexual Activity   Alcohol use: Yes    Comment: weekends, occasional   Drug use: No   Sexual activity: Not on file  Other Topics Concern   Not on file  Social History Narrative   Not on file   Social Determinants of Health   Financial Resource Strain: Not on file  Food Insecurity: Not on file  Transportation Needs: Not on file   Physical Activity: Not on file  Stress: Not on file  Social Connections: Not on file  Intimate Partner Violence: Not on file    Current Outpatient Medications on File Prior to Visit  Medication Sig Dispense Refill   ACCU-CHEK GUIDE test strip TEST UP TO FOUR TIMES A DAY AS DIRECTED (START WITH TESTING ONCE PER DAY) 400 each 1   atorvastatin (LIPITOR) 20 MG tablet Take 1 tablet (20 mg total) by mouth daily at 6 PM. 90 tablet 1   blood glucose meter kit and supplies Dispense based on patient and insurance preference. Use up to four times daily as directed. (FOR ICD-10 E10.9, E11.9). Once per day for now. 1 each 0   [facility] D3 50 MCG (2000 UT) CAPS TAKE 1 CAPSULE BY MOUTH EVERY DAY 100 capsule 3   Dulaglutide (TRULICITY) 2.76 DY/7.0LK SOPN Inject 0.75 mg into the skin once a week. 6 mL 3   levothyroxine (SYNTHROID) 175 MCG tablet TAKE 1 TABLET BY MOUTH DAILY BEFORE BREAKFAST. 90 tablet 1   lisinopril (ZESTRIL) 10 MG tablet Take 1 tablet (10 mg total) by mouth daily. 90 tablet 1   metFORMIN (GLUCOPHAGE) 1000 MG tablet Take 1 tablet (1,000 mg total) by mouth 2 (two) times daily with a meal. 180 tablet 1   omeprazole (PRILOSEC) 40 MG capsule  TAKE ONE PILL ,40 MG, ONE HOUR BEFORE BREAKFAST. TAKE FOR AT LEAST 6 WEEKS TO PROMOTE ESOPHAGEAL HEALING. 30 capsule 0   No current facility-administered medications on file prior to visit.    No Known Allergies  Family History  Problem Relation Age of Onset   Alcoholism Mother    Drug abuse Mother    Alcoholism Father    Lung cancer Maternal Grandfather    Lung cancer Paternal Grandfather    Diabetes Maternal Grandmother    Colon cancer Neg Hx    Stomach cancer Neg Hx    Esophageal cancer Neg Hx    Thyroid disease Neg Hx     BP 110/68 (BP Location: Left Arm, Patient Position: Sitting, Cuff Size: Normal)   Pulse (!) 103   Ht 6' 4" (1.93 m)   Wt 253 lb 9.6 oz (115 kg)   SpO2 98%   BMI 30.87 kg/m    Review of Systems     Objective:    Physical Exam VITAL SIGNS:  See vs page GENERAL: no distress Neck: a healed scar is present.  I do not appreciate a nodule in the thyroid or elsewhere in the neck  Lab Results  Component Value Date   TSH 0.30 (L) 03/13/2021      Assessment & Plan:  Hypothyroidism: well-controlled, for this clinical situation.  Please continue the same synthroid. PTC: We discussed.  He wants to decline any imaging, at least for now.    Patient Instructions  Blood tests are requested for you today.  We'll let you know about the results.   Please come back for a follow-up appointment in 6 months.

## 2021-03-13 NOTE — Patient Instructions (Signed)
Blood tests are requested for you today.  We'll let you know about the results.  Please come back for a follow-up appointment in 6 months.   

## 2021-03-16 LAB — THYROGLOBULIN ANTIBODY: Thyroglobulin Ab: 79 IU/mL — ABNORMAL HIGH (ref <=1)

## 2021-03-16 LAB — THYROGLOBULIN LEVEL: Thyroglobulin: <0.1 ng/mL — ABNORMAL LOW

## 2021-03-20 ENCOUNTER — Other Ambulatory Visit: Payer: Self-pay | Admitting: Internal Medicine

## 2021-03-20 DIAGNOSIS — K209 Esophagitis, unspecified without bleeding: Secondary | ICD-10-CM

## 2021-05-02 ENCOUNTER — Other Ambulatory Visit: Payer: Self-pay | Admitting: Family Medicine

## 2021-05-02 DIAGNOSIS — I10 Essential (primary) hypertension: Secondary | ICD-10-CM

## 2021-05-02 DIAGNOSIS — E785 Hyperlipidemia, unspecified: Secondary | ICD-10-CM

## 2021-05-14 ENCOUNTER — Ambulatory Visit (INDEPENDENT_AMBULATORY_CARE_PROVIDER_SITE_OTHER): Payer: BC Managed Care – PPO | Admitting: Family Medicine

## 2021-05-14 VITALS — BP 128/76 | HR 78 | Temp 98.2°F | Resp 15 | Ht 76.0 in | Wt 256.6 lb

## 2021-05-14 DIAGNOSIS — E1165 Type 2 diabetes mellitus with hyperglycemia: Secondary | ICD-10-CM | POA: Diagnosis not present

## 2021-05-14 DIAGNOSIS — E785 Hyperlipidemia, unspecified: Secondary | ICD-10-CM

## 2021-05-14 DIAGNOSIS — I10 Essential (primary) hypertension: Secondary | ICD-10-CM | POA: Diagnosis not present

## 2021-05-14 DIAGNOSIS — E890 Postprocedural hypothyroidism: Secondary | ICD-10-CM

## 2021-05-14 MED ORDER — TRULICITY 0.75 MG/0.5ML ~~LOC~~ SOAJ: 0.7500 mg | SUBCUTANEOUS | 3 refills | Status: DC

## 2021-05-14 MED ORDER — METFORMIN HCL 1000 MG PO TABS: 1000.0000 mg | ORAL_TABLET | Freq: Two times a day (BID) | ORAL | 1 refills | Status: DC

## 2021-05-14 NOTE — Progress Notes (Signed)
Subjective:  Patient ID: Tyrone Lee, male    DOB: [birth year]  Age: 50 y.o. MRN: [id number]  CC:  Chief Complaint  Patient presents with   Diabetes    Pt here for 6 months recheck no concerns per pt    Hyperlipidemia    Pt due fore recheck, refill sent 05/04/2021 for 6 months     HPI Tyrone Lee presents for   Diabetes: With hyperglycemia Metformin 5573UK BID, trulicity 0.25KY Q week.  No hx of pancreatitis, no new side effects of meds.  Home readings -  Fasting 100-120 Postprandial -160-200 at times (rare).  No new med side effects.  Microalbumin: nl ratio 11/06/20.  Optho, foot exam, pneumovax: pneumovax in 2019.  [facility]  Flu vaccine: declines   Immunization History  Administered Date(s) Administered   Janssen (J&J) SARS-COV-2 Vaccination 09/15/2019   PFIZER(Purple Top)SARS-COV-2 Vaccination 05/02/2020   Pneumococcal Polysaccharide-23 11/01/2017   Tdap 02/01/2019   Zoster Recombinat (Shingrix) 05/02/2020, 11/06/2020     Lab Results  Component Value Date   HGBA1C 6.8 (H) 11/06/2020   HGBA1C 6.7 (H) 05/02/2020   HGBA1C 7.0 (H) 11/01/2019   Lab Results  Component Value Date   MICROALBUR 1.2 11/06/2020   LDLCALC 71 11/06/2020   CREATININE 1.04 11/06/2020   Hyperlipidemia: Lipitor 35m qd.  Lab Results  Component Value Date   CHOL 133 11/06/2020   HDL 33.90 (L) 11/06/2020   LDLCALC 71 11/06/2020   TRIG 138.0 11/06/2020   CHOLHDL 4 11/06/2020   Lab Results  Component Value Date   ALT 29 11/06/2020   AST 18 11/06/2020   ALKPHOS 54 11/06/2020   BILITOT 0.9 11/06/2020   Hypertension: Lisinopril 152mqd.  Home readings:none.  BP Readings from Last 3 Encounters:  05/14/21 128/76  03/13/21 110/68  01/29/21 113/74   Lab Results  Component Value Date   CREATININE 1.04 11/06/2020    Hypothyroidism post operative from thyroid CA, hyperparathyroidism.  Lab Results  Component Value Date   TSH 0.30 (L) 03/13/2021   Taking medication daily. Synthroid Followed by endocrinology, decreasing thyroglobulin Ab in 03/2021.  No new hot or cold intolerance. No new hair or skin changes, heart palpitations or new fatigue. No new weight changes.     History Patient Active Problem List   Diagnosis Date Noted   [place]athyroidism ([facility]/23/2020   Hypothyroidism 07/27/2018   Thyroid cancer ([facility]/15/2020   Past Medical History:  Diagnosis Date   Acid reflux    on meds   Diabetes mellitus without complication ([facility]   on meds   Hemorrhoids    Hyperlipidemia    Hypertension    Past Surgical History:  Procedure Laterality Date   RADICAL NECK DISSECTION Right 07/19/2018   Procedure: RIGHT NECK DISSECTION;  Surgeon: NeRozetta NunneryMD;  [facility] Service: [facility];  Laterality: Right;   THYROIDECTOMY Right 07/19/2018   Procedure: TOTAL THYROIDECTOMY AND RIGHT NECK DISSECTION;  Surgeon: NeRozetta NunneryMD;  [facility] Service: ENT;  Laterality: Right;   No Known Allergies Prior to Admission medications   Medication Sig Start Date End Date Taking? Authorizing Provider  ACCU-CHEK GUIDE test strip TEST UP TO FOUR TIMES A DAY AS DIRECTED (START WITH TESTING ONCE PER DAY) 04/21/18  Yes GrWendie AgresteMD  atorvastatin (LIPITOR) 20 MG tablet TAKE 1 TABLET BY MOUTH DAILY AT 6 PM. 05/04/21  Yes GrWendie AgresteMD  blood glucose meter  kit and supplies Dispense based on patient and insurance preference. Use up to four times daily as directed. (FOR ICD-10 E10.9, E11.9). Once per day for now. 08/04/17  Yes Wendie Agreste, MD  [facility] D3 50 MCG (2000 UT) CAPS TAKE 1 CAPSULE BY MOUTH EVERY DAY 09/04/19  Yes Renato Shin, MD  Dulaglutide (TRULICITY) 7.12 WP/8.0DX SOPN Inject 0.75 mg into the skin once a week. 11/06/20  Yes Wendie Agreste, MD  levothyroxine (SYNTHROID) 175 MCG tablet TAKE 1 TABLET BY MOUTH DAILY BEFORE BREAKFAST. 12/12/20  Yes Renato Shin, MD  lisinopril (ZESTRIL) 10 MG tablet  TAKE 1 TABLET BY MOUTH EVERY DAY 05/04/21  Yes Wendie Agreste, MD  metFORMIN (GLUCOPHAGE) 1000 MG tablet Take 1 tablet (1,000 mg total) by mouth 2 (two) times daily with a meal. 11/06/20  Yes Wendie Agreste, MD  omeprazole (PRILOSEC) 40 MG capsule TAKE ONE PILL ,40 MG, ONE HOUR BEFORE BREAKFAST. TAKE FOR AT LEAST 6 WEEKS TO PROMOTE ESOPHAGEAL HEALING. 03/20/21  Yes Pyrtle, Lajuan Lines, MD   Social History   Socioeconomic History   Marital status: Married    Spouse name: Not on file   Number of children: 2   Years of education: Not on file   Highest education level: Not on file  Occupational History   Not on file  Tobacco Use   Smoking status: Never   Smokeless tobacco: Never  Vaping Use   Vaping Use: Never used  Substance and Sexual Activity   Alcohol use: Yes    Comment: weekends, occasional   Drug use: No   Sexual activity: Not on file  Other Topics Concern   Not on file  Social History Narrative   Not on file   Social Determinants of Health   Financial Resource Strain: Not on file  Food Insecurity: Not on file  Transportation Needs: Not on file  Physical Activity: Not on file  Stress: Not on file  Social Connections: Not on file  Intimate Partner Violence: Not on file    Review of Systems  Constitutional:  Negative for fatigue and unexpected weight change.  Eyes:  Negative for visual disturbance.  Respiratory:  Negative for cough, chest tightness and shortness of breath.   Cardiovascular:  Negative for chest pain, palpitations and leg swelling.  Gastrointestinal:  Negative for abdominal pain and blood in stool.  Neurological:  Negative for dizziness, light-headedness and headaches.    Objective:   Vitals:   05/14/21 1434  BP: 128/76  Pulse: 78  Resp: 15  Temp: 98.2 F (36.8 C)  TempSrc: Temporal  SpO2: 98%  Weight: 256 lb 9.6 oz (116.4 kg)  Height: 6' 4"  (1.93 m)     Physical Exam Vitals reviewed.  Constitutional:      Appearance: He is  well-developed.  HENT:     Head: Normocephalic and atraumatic.  Neck:     Vascular: No carotid bruit or JVD.  Cardiovascular:     Rate and Rhythm: Normal rate and regular rhythm.     Heart sounds: Normal heart sounds. No murmur heard. Pulmonary:     Effort: Pulmonary effort is normal.     Breath sounds: Normal breath sounds. No rales.  Musculoskeletal:     Right lower leg: No edema.     Left lower leg: No edema.  Skin:    General: Skin is warm and dry.  Neurological:     Mental Status: He is alert and oriented to person, place, and time.  Psychiatric:  Mood and Affect: Mood normal.       Assessment & Plan:  Tyrone Lee is a 50 y.o. male . Essential hypertension - Plan: Comprehensive metabolic panel  -  Stable, tolerating current regimen. Medications refilled. Labs pending as above.   Type 2 diabetes mellitus with hyperglycemia, without long-term current use of insulin (HCC) - Plan: Dulaglutide (TRULICITY) 0.71 GR/2.4XB SOPN, metFORMIN (GLUCOPHAGE) 1000 MG tablet, Hemoglobin A1c  -  Stable, tolerating current regimen. Medications refilled. Labs pending as above.  Slight elevated postprandials but last A1c reassuring.  Updated A1c ordered.  Hyperlipidemia, unspecified hyperlipidemia type - Plan: Lipid panel  Stable, tolerating current regimen. Medications refilled. Labs pending as above.   Postoperative hypothyroidism Followed by endocrinology with decreasing thyroglobulin antibody.  Normal free T4 in September.  Meds ordered this encounter  Medications   Dulaglutide (TRULICITY) 9.80 OX/2.3NP SOPN    Sig: Inject 0.75 mg into the skin once a week.    Dispense:  6 mL    Refill:  3   metFORMIN (GLUCOPHAGE) 1000 MG tablet    Sig: Take 1 tablet (1,000 mg total) by mouth 2 (two) times daily with a meal.    Dispense:  180 tablet    Refill:  1   There are no Patient Instructions on file for this visit.    Signed,   Merri Ray, MD [place],  [facility] 05/14/21 3:28 PM

## 2021-05-15 ENCOUNTER — Other Ambulatory Visit: Payer: Self-pay | Admitting: Family Medicine

## 2021-05-15 DIAGNOSIS — E1165 Type 2 diabetes mellitus with hyperglycemia: Secondary | ICD-10-CM

## 2021-05-15 LAB — HEMOGLOBIN A1C: Hgb A1c MFr Bld: 7.4 % — ABNORMAL HIGH (ref 4.6–6.5)

## 2021-05-15 LAB — LIPID PANEL
Cholesterol: 159 mg/dL (ref 0–200)
HDL: 33.7 mg/dL — ABNORMAL LOW (ref >39.00)
LDL Cholesterol: 98 mg/dL (ref 0–99)
NonHDL: 125.61
Total CHOL/HDL Ratio: 5
Triglycerides: 136 mg/dL (ref 0.0–149.0)
VLDL: 27.2 mg/dL (ref 0.0–40.0)

## 2021-05-15 LAB — COMPREHENSIVE METABOLIC PANEL
ALT: 45 U/L (ref 0–53)
AST: 24 U/L (ref 0–37)
Albumin: 4.7 g/dL (ref 3.5–5.2)
Alkaline Phosphatase: 51 U/L (ref 39–117)
BUN: 15 mg/dL (ref 6–23)
CO2: 27 mEq/L (ref 19–32)
Calcium: 9.5 mg/dL (ref 8.4–10.5)
Chloride: 103 mEq/L (ref 96–112)
Creatinine, Ser: 0.99 mg/dL (ref 0.40–1.50)
GFR: 87.64 mL/min (ref >60.00)
Glucose, Bld: 92 mg/dL (ref 70–99)
Potassium: 3.8 mEq/L (ref 3.5–5.1)
Sodium: 140 mEq/L (ref 135–145)
Total Bilirubin: 1.1 mg/dL (ref 0.2–1.2)
Total Protein: 7.3 g/dL (ref 6.0–8.3)

## 2021-06-24 ENCOUNTER — Other Ambulatory Visit: Payer: Self-pay | Admitting: Endocrinology

## 2021-09-10 ENCOUNTER — Other Ambulatory Visit: Payer: Self-pay

## 2021-09-10 ENCOUNTER — Ambulatory Visit (INDEPENDENT_AMBULATORY_CARE_PROVIDER_SITE_OTHER): Payer: BC Managed Care – PPO | Admitting: Endocrinology

## 2021-09-10 VITALS — BP 152/70 | HR 66 | Ht 76.0 in | Wt 252.8 lb

## 2021-09-10 DIAGNOSIS — C73 Malignant neoplasm of thyroid gland: Secondary | ICD-10-CM | POA: Diagnosis not present

## 2021-09-10 DIAGNOSIS — E890 Postprocedural hypothyroidism: Secondary | ICD-10-CM

## 2021-09-10 LAB — T4, FREE: Free T4: 1.49 ng/dL (ref 0.60–1.60)

## 2021-09-10 LAB — TSH: TSH: 0.02 u[IU]/mL — ABNORMAL LOW (ref 0.35–5.50)

## 2021-09-10 NOTE — Patient Instructions (Addendum)
Blood tests are requested for you today.  We'll let you know about the results.  Please come back for a follow-up appointment in 1 year.    

## 2021-09-10 NOTE — Progress Notes (Signed)
? ?Subjective:  ? ? Patient ID: Tyrone Lee, male    DOB: [birth year], 51 y.o.   MRN: [id number] ? ?HPI ?PTC,stage 1, with this chronology.  ?1/20: thyroidectomy and RND: multifocal PTC, largest focus is 3.2 cm (T2N1).   ?2/20: RAI, 127 mCi ?2/20: post-therapy scan: small focus in the neck.   ?8/20 Tg undetectable (ab pos).   ?8/21 Tg undetectable (ab pos). ?9/21 [place] no nodule or thyroid tissue ?3/22 Tg undetectable (ab pos) ?9/22 Tg undetectable (ab pos).  ?He denies neck swelling, pain, or nodule.   ?Hyperparathyroidism (dx'ed 2019; parathyroid tissue was in the 1/20 surgical specimen; labs have been normal since).  He takes Vit-D, 1000 units/d.   ?Postsurgical hypothyroidism: he takes synthroid as rx'ed.   ?Past Medical History:  ?Diagnosis Date  ? Acid reflux   ? on meds  ? Diabetes mellitus without complication ([place])   ? on meds  ? Hemorrhoids   ? Hyperlipidemia   ? Hypertension   ? ? ?Past Surgical History:  ?Procedure Laterality Date  ? RADICAL NECK DISSECTION Right 07/19/2018  ? Procedure: RIGHT NECK DISSECTION;  Surgeon: Rozetta Nunnery, MD;  Location: [place];  Service: ENT;  Laterality: Right;  ? THYROIDECTOMY Right 07/19/2018  ? Procedure: TOTAL THYROIDECTOMY AND RIGHT NECK DISSECTION;  Surgeon: Rozetta Nunnery, MD;  Location: [place];  Service: ENT;  Laterality: Right;  ? ? ?Social History  ? ?Socioeconomic History  ? Marital status: Married  ?  Spouse name: Not on file  ? Number of children: 2  ? Years of education: Not on file  ? Highest education level: Not on file  ?Occupational History  ? Not on file  ?Tobacco Use  ? Smoking status: Never  ? Smokeless tobacco: Never  ?Vaping Use  ? Vaping Use: Never used  ?Substance and Sexual Activity  ? Alcohol use: Yes  ?  Comment: weekends, occasional  ? Drug use: No  ? Sexual activity: Not on file  ?Other Topics Concern  ? Not on file  ?Social History Narrative  ? Not on file  ? ?Social Determinants of Health  ? ?Financial Resource Strain: Not on file   ?Food Insecurity: Not on file  ?Transportation Needs: Not on file  ?Physical Activity: Not on file  ?Stress: Not on file  ?Social Connections: Not on file  ?Intimate Partner Violence: Not on file  ? ? ?Current Outpatient Medications on File Prior to Visit  ?Medication Sig Dispense Refill  ? ACCU-CHEK GUIDE test strip TEST UP TO FOUR TIMES A DAY AS DIRECTED (START WITH TESTING ONCE PER DAY) 400 each 1  ? atorvastatin (LIPITOR) 20 MG tablet TAKE 1 TABLET BY MOUTH DAILY AT 6 PM. 90 tablet 1  ? blood glucose meter kit and supplies Dispense based on patient and insurance preference. Use up to four times daily as directed. (FOR ICD-10 E10.9, E11.9). Once per day for now. 1 each 0  ? [facility] D3 50 MCG (2000 UT) CAPS TAKE 1 CAPSULE BY MOUTH EVERY DAY 100 capsule 3  ? Dulaglutide (TRULICITY) 4.08 XK/4.8JE SOPN Inject 0.75 mg into the skin once a week. 6 mL 3  ? lisinopril (ZESTRIL) 10 MG tablet TAKE 1 TABLET BY MOUTH EVERY DAY 90 tablet 1  ? metFORMIN (GLUCOPHAGE) 1000 MG tablet Take 1 tablet (1,000 mg total) by mouth 2 (two) times daily with a meal. 180 tablet 1  ? omeprazole (PRILOSEC) 40 MG capsule TAKE ONE PILL ,40 MG, ONE HOUR BEFORE BREAKFAST. TAKE  FOR AT LEAST 6 WEEKS TO PROMOTE ESOPHAGEAL HEALING. 30 capsule 0  ? ?No current facility-administered medications on file prior to visit.  ? ? ?No Known Allergies ? ?Family History  ?Problem Relation Age of Onset  ? Alcoholism Mother   ? Drug abuse Mother   ? Alcoholism Father   ? Lung cancer Maternal Grandfather   ? Lung cancer Paternal Grandfather   ? Diabetes Maternal Grandmother   ? Colon cancer Neg Hx   ? Stomach cancer Neg Hx   ? Esophageal cancer Neg Hx   ? Thyroid disease Neg Hx   ? ? ?BP (!) 152/70   Pulse 66   Ht _0  (1.93 m)   Wt 252 lb 12.8 oz (114.7 kg)   SpO2 99%   BMI 30.77 kg/m?  ? ? ?Review of Systems ? ?   ?Objective:  ? Physical Exam ?VITAL SIGNS:  See vs page ?GENERAL: no distress ?Neck: a healed scar is present.  I do not appreciate a nodule in the  thyroid or elsewhere in the neck.   ? ? ?Lab Results  ?Component Value Date  ? TSH 0.02 (L) 09/10/2021  ? ? ?   ?Assessment & Plan:  ?Hypothyroidism.  Overcontrolled.  I have sent a prescription to your pharmacy, to reduce synthroid ?PTC: I advised [place].  I told him this is because interpretation of TG is limited by antibody.  He declines ? ?Patient Instructions  ?Blood tests are requested for you today.  We'll let you know about the results.   ?Please come back for a follow-up appointment in 1 year.   ? ? ?

## 2021-09-11 LAB — THYROGLOBULIN ANTIBODY: Thyroglobulin Ab: 76 IU/mL — ABNORMAL HIGH (ref <=1)

## 2021-09-11 LAB — THYROGLOBULIN LEVEL: Thyroglobulin: <0.1 ng/mL — ABNORMAL LOW

## 2021-09-12 MED ORDER — LEVOTHYROXINE SODIUM 150 MCG PO TABS: 150.0000 ug | ORAL_TABLET | Freq: Every day | ORAL | 3 refills | Status: DC

## 2021-10-23 ENCOUNTER — Telehealth: Payer: Self-pay

## 2021-10-23 DIAGNOSIS — E1165 Type 2 diabetes mellitus with hyperglycemia: Secondary | ICD-10-CM

## 2021-10-23 NOTE — Telephone Encounter (Signed)
Patient is on vacation and forgot to pack Trulicity, asking for a 7 day supply be sent to [facility]. ?

## 2021-10-25 MED ORDER — TRULICITY 0.75 MG/0.5ML ~~LOC~~ SOAJ: 0.7500 mg | SUBCUTANEOUS | 0 refills | Status: DC

## 2021-10-25 NOTE — Addendum Note (Signed)
Addended by: Merri Ray R on: 10/25/2021 04:26 PM ? ? Modules accepted: Orders ? ?

## 2021-10-25 NOTE — Telephone Encounter (Signed)
Sent!

## 2021-10-25 NOTE — Telephone Encounter (Signed)
No problem.  short-term prescription sent with MyChart message sent to patient. ?

## 2021-10-27 ENCOUNTER — Other Ambulatory Visit: Payer: Self-pay | Admitting: Family Medicine

## 2021-10-27 DIAGNOSIS — I10 Essential (primary) hypertension: Secondary | ICD-10-CM

## 2021-10-27 DIAGNOSIS — E785 Hyperlipidemia, unspecified: Secondary | ICD-10-CM

## 2021-11-11 ENCOUNTER — Ambulatory Visit (INDEPENDENT_AMBULATORY_CARE_PROVIDER_SITE_OTHER): Payer: BC Managed Care – PPO | Admitting: Family Medicine

## 2021-11-11 ENCOUNTER — Encounter: Payer: Self-pay | Admitting: Family Medicine

## 2021-11-11 VITALS — BP 122/78 | HR 60 | Temp 98.0°F | Resp 16 | Ht 76.0 in | Wt 254.6 lb

## 2021-11-11 DIAGNOSIS — I10 Essential (primary) hypertension: Secondary | ICD-10-CM | POA: Diagnosis not present

## 2021-11-11 DIAGNOSIS — E890 Postprocedural hypothyroidism: Secondary | ICD-10-CM | POA: Diagnosis not present

## 2021-11-11 DIAGNOSIS — E1165 Type 2 diabetes mellitus with hyperglycemia: Secondary | ICD-10-CM | POA: Diagnosis not present

## 2021-11-11 DIAGNOSIS — E785 Hyperlipidemia, unspecified: Secondary | ICD-10-CM

## 2021-11-11 DIAGNOSIS — Z0000 Encounter for general adult medical examination without abnormal findings: Secondary | ICD-10-CM

## 2021-11-11 MED ORDER — ATORVASTATIN CALCIUM 20 MG PO TABS: ORAL_TABLET | ORAL | 1 refills | Status: DC

## 2021-11-11 MED ORDER — LISINOPRIL 10 MG PO TABS: 10.0000 mg | ORAL_TABLET | Freq: Every day | ORAL | 1 refills | Status: DC

## 2021-11-11 MED ORDER — TRULICITY 0.75 MG/0.5ML ~~LOC~~ SOAJ: 0.7500 mg | SUBCUTANEOUS | 1 refills | Status: DC

## 2021-11-11 MED ORDER — TRULICITY 0.75 MG/0.5ML ~~LOC~~ SOAJ: 0.7500 mg | SUBCUTANEOUS | 0 refills | Status: DC

## 2021-11-11 MED ORDER — METFORMIN HCL 1000 MG PO TABS: 1000.0000 mg | ORAL_TABLET | Freq: Two times a day (BID) | ORAL | 1 refills | Status: DC

## 2021-11-11 NOTE — Patient Instructions (Addendum)
Keep follow up with endocrine for now. I will check thyroid test today on new dose.  ?If A1c is elevated, we may need to increase trulicity. I will let you know.  ?Thanks for coming in today.  ?Preventive Care 53-53 Years Old, Male ?Preventive care refers to lifestyle choices and visits with your health care provider that can promote health and wellness. Preventive care visits are also called wellness exams. ?What can I expect for my preventive care visit? ?Counseling ?During your preventive care visit, your health care provider may ask about your: ?Medical history, including: ?Past medical problems. ?Family medical history. ?Current health, including: ?Emotional well-being. ?Home life and relationship well-being. ?Sexual activity. ?Lifestyle, including: ?Alcohol, nicotine or tobacco, and drug use. ?Access to firearms. ?Diet, exercise, and sleep habits. ?Safety issues such as seatbelt and bike helmet use. ?Sunscreen use. ?Work and work Statistician. ?Physical exam ?Your health care provider will check your: ?Height and weight. These may be used to calculate your BMI (body mass index). BMI is a measurement that tells if you are at a healthy weight. ?Waist circumference. This measures the distance around your waistline. This measurement also tells if you are at a healthy weight and may help predict your risk of certain diseases, such as type 2 diabetes and high blood pressure. ?Heart rate and blood pressure. ?Body temperature. ?Skin for abnormal spots. ?What immunizations do I need? ? ?Vaccines are usually given at various ages, according to a schedule. Your health care provider will recommend vaccines for you based on your age, medical history, and lifestyle or other factors, such as travel or where you work. ?What tests do I need? ?Screening ?Your health care provider may recommend screening tests for certain conditions. This may include: ?Lipid and cholesterol levels. ?Diabetes screening. This is done by checking  your blood sugar (glucose) after you have not eaten for a while (fasting). ?Hepatitis B test. ?Hepatitis C test. ?HIV (human immunodeficiency virus) test. ?STI (sexually transmitted infection) testing, if you are at risk. ?Lung cancer screening. ?Prostate cancer screening. ?Colorectal cancer screening. ?Talk with your health care provider about your test results, treatment options, and if necessary, the need for more tests. ?Follow these instructions at home: ?Eating and drinking ? ?Eat a diet that includes fresh fruits and vegetables, whole grains, lean protein, and low-fat dairy products. ?Take vitamin and mineral supplements as recommended by your health care provider. ?Do not drink alcohol if your health care provider tells you not to drink. ?If you drink alcohol: ?Limit how much you have to 0-2 drinks a day. ?Know how much alcohol is in your drink. In the [place] one drink equals one 12 oz bottle of beer (355 mL), one 5 oz glass of wine (148 mL), or one 1? oz glass of hard liquor (44 mL). ?Lifestyle ?Brush your teeth every morning and night with fluoride toothpaste. Floss one time each day. ?Exercise for at least 30 minutes 5 or more days each week. ?Do not use any products that contain nicotine or tobacco. These products include cigarettes, chewing tobacco, and vaping devices, such as e-cigarettes. If you need help quitting, ask your health care provider. ?Do not use drugs. ?If you are sexually active, practice safe sex. Use a condom or other form of protection to prevent STIs. ?Take aspirin only as told by your health care provider. Make sure that you understand how much to take and what form to take. Work with your health care provider to find out whether it is safe and beneficial  for you to take aspirin daily. ?Find healthy ways to manage stress, such as: ?Meditation, yoga, or listening to music. ?Journaling. ?Talking to a trusted person. ?Spending time with friends and family. ?Minimize exposure to UV  radiation to reduce your risk of skin cancer. ?Safety ?Always wear your seat belt while driving or riding in a vehicle. ?Do not drive: ?If you have been drinking alcohol. Do not ride with someone who has been drinking. ?When you are tired or distracted. ?While texting. ?If you have been using any mind-altering substances or drugs. ?Wear a helmet and other protective equipment during sports activities. ?If you have firearms in your house, make sure you follow all gun safety procedures. ?What's next? ?Go to your health care provider once a year for an annual wellness visit. ?Ask your health care provider how often you should have your eyes and teeth checked. ?Stay up to date on all vaccines. ?This information is not intended to replace advice given to you by your health care provider. Make sure you discuss any questions you have with your health care provider. ?Document Revised: 12/17/2020 Document Reviewed: 12/17/2020 ?Elsevier Patient Education ? [place]. ? ? ? ?

## 2021-11-11 NOTE — Progress Notes (Signed)
? ?Subjective:  ?Patient ID: Tyrone Lee, male    DOB: 1971-05-01  Age: 51 y.o. MRN: [id number] ? ?CC:  ?Chief Complaint  ?Patient presents with  ? Annual Exam  ?  Pt notes he is fasting today, no concerns   ? ? ?HPI ?Tyrone Lee presents for Annual Exam ? ?History of thyroid cancer status post thyroidectomy, treated with Synthroid for postoperative hypothyroidism.  150 mcg daily (decreased at last visit - Followed by endocrinologist Dr. Loanne Drilling.  Last visit March 9). Hyperparathyroidism diagnosed 2019, takes vitamin D, labs have been normal since parathyroid tissue in the 07/2018 surgical specimen. ?Thyroglobulin Ab level decreasing.  ?Lab Results  ?Component Value Date  ? TSH 0.02 (L) 09/10/2021  ? ?Hypertension: ?Lisinopril 10 mg daily ?Home readings: none, no med side effects.  ?BP Readings from Last 3 Encounters:  ?11/11/21 122/78  ?09/10/21 (!) 152/70  ?05/14/21 128/76  ? ?Lab Results  ?Component Value Date  ? CREATININE 0.99 05/14/2021  ? ?Diabetes: ?With hyperglycemia.  Treated with metformin 1000 mg twice daily, Trulicity 5.59 mg weekly at his last visit with me in November 2022. Has been trying to watch diet.  ?Home readings: ?Fasting: unknown.  ?Postprandial: 150-200 ?No symptomatic lows or new med side effects. ?Microalbumin: Normal ratio 11/06/2020. ?Optho, foot exam, pneumovax ?Last optho visit about a year ago. Declines repeat visit at this time. Retinopathy screening discussed.  ? ?Lab Results  ?Component Value Date  ? HGBA1C 7.4 (H) 05/14/2021  ? HGBA1C 6.8 (H) 11/06/2020  ? HGBA1C 6.7 (H) 05/02/2020  ? ?Lab Results  ?Component Value Date  ? MICROALBUR 1.2 11/06/2020  ? [place] 98 05/14/2021  ? CREATININE 0.99 05/14/2021  ? ?Hyperlipidemia: ?Lipitor 20 mg daily, no new myalgias/side effects.  ?Lab Results  ?Component Value Date  ? CHOL 159 05/14/2021  ? HDL 33.70 (L) 05/14/2021  ? [place] 98 05/14/2021  ? TRIG 136.0 05/14/2021  ? CHOLHDL 5 05/14/2021  ? ?Lab Results  ?Component Value Date  ?  ALT 45 05/14/2021  ? AST 24 05/14/2021  ? ALKPHOS 51 05/14/2021  ? BILITOT 1.1 05/14/2021  ? ? ? ?  11/06/2020  ?  3:29 PM 05/02/2020  ?  3:33 PM 11/01/2019  ?  4:04 PM 08/02/2019  ?  3:16 PM 05/03/2019  ?  3:11 PM  ?Depression screen PHQ 2/9  ?Decreased Interest 0 0 0 0 0  ?Down, Depressed, Hopeless 0 0 0 0 0  ?PHQ - 2 Score 0 0 0 0 0  ? ? ?Health Maintenance  ?Topic Date Due  ? HEMOGLOBIN A1C  11/11/2021  ? OPHTHALMOLOGY EXAM  11/11/2021 (Originally 05/28/2021)  ? COVID-19 Vaccine (5 - Booster for Janssen series) 11/27/2021 (Originally 07/09/2021)  ? Hepatitis C Screening  05/14/2022 (Originally 12/15/1986)  ? HIV Screening  05/14/2022 (Originally 12/15/1983)  ? INFLUENZA VACCINE  02/02/2022  ? COLONOSCOPY (Pts 45-18yr Insurance coverage will need to be confirmed)  01/30/2028  ? TETANUS/TDAP  01/31/2029  ? Zoster Vaccines- Shingrix  Completed  ? HPV VACCINES  Aged Out  ? FOOT EXAM  Discontinued  ?Colon: 01/2021 - few polyps removed, tubular adenomas. Repeat 7 years.  ?Prostate: does NOT have family history of prostate cancer ?The natural history of prostate cancer and ongoing controversy regarding screening and potential treatment outcomes of prostate cancer has been discussed with the patient. The meaning of a false positive PSA and a false negative PSA has been discussed. He indicates understanding of the limitations of this screening test and  wishes NOT to proceed with screening PSA testing. ?No results found for: PSA1, PSA ? ? ? ?Immunization History  ?Administered Date(s) Administered  ? Janssen (J&J) SARS-COV-2 Vaccination 09/15/2019  ? PFIZER(Purple Top)SARS-COV-2 Vaccination 05/02/2020, 11/14/2020, 05/14/2021  ? Pneumococcal Polysaccharide-23 11/01/2017  ? Tdap 02/01/2019  ? Zoster Recombinat (Shingrix) 05/02/2020, 11/06/2020  ? ? ?Vision Screening  ? Right eye Left eye Both eyes  ?Without correction _0  ?With correction     ?As above - declines repeat optho eval currently.  ? ?Dental:Yes and Within  Last 6 months 3 times per year.  ? ?Alcohol: none.  ? ?Tobacco: none ? ?Exercise:walking, most days, 1 hour.  ?Mostly cooks at home.  ?Cut out sodas years ago.  ?Body mass index is 30.99 kg/m?. ? ? ?History ?Patient Active Problem List  ? Diagnosis Date Noted  ? Hyperparathyroidism ([place]) 07/27/2018  ? Hypothyroidism 07/27/2018  ? Thyroid cancer ([place]) 07/19/2018  ? ?Past Medical History:  ?Diagnosis Date  ? Acid reflux   ? on meds  ? Diabetes mellitus without complication ([place])   ? on meds  ? Hemorrhoids   ? Hyperlipidemia   ? Hypertension   ? ?Past Surgical History:  ?Procedure Laterality Date  ? RADICAL NECK DISSECTION Right 07/19/2018  ? Procedure: RIGHT NECK DISSECTION;  Surgeon: Rozetta Nunnery, MD;  Location: [place];  Service: ENT;  Laterality: Right;  ? THYROIDECTOMY Right 07/19/2018  ? Procedure: TOTAL THYROIDECTOMY AND RIGHT NECK DISSECTION;  Surgeon: Rozetta Nunnery, MD;  Location: [facility];  Service: ENT;  Laterality: Right;  ? ?No Known Allergies ?Prior to Admission medications   ?Medication Sig Start Date End Date Taking? Authorizing Provider  ?ACCU-CHEK GUIDE test strip TEST UP TO FOUR TIMES A DAY AS DIRECTED (START WITH TESTING ONCE PER DAY) 04/21/18  Yes Wendie Agreste, MD  ?atorvastatin (LIPITOR) 20 MG tablet TAKE 1 TABLET BY MOUTH DAILY AT 6 PM. 10/27/21  Yes Wendie Agreste, MD  ?blood glucose meter kit and supplies Dispense based on patient and insurance preference. Use up to four times daily as directed. (FOR ICD-10 E10.9, E11.9). Once per day for now. 08/04/17  Yes Wendie Agreste, MD  ?[employer] D3 50 MCG (2000 UT) CAPS TAKE 1 CAPSULE BY MOUTH EVERY DAY 09/04/19  Yes Renato Shin, MD  ?Dulaglutide (TRULICITY) 0.88 PJ/0.3PR SOPN Inject 0.75 mg into the skin once a week. 10/25/21  Yes Wendie Agreste, MD  ?levothyroxine (SYNTHROID) 150 MCG tablet Take 1 tablet (150 mcg total) by mouth daily. 09/12/21  Yes Renato Shin, MD  ?lisinopril (ZESTRIL) 10 MG tablet TAKE 1 TABLET BY MOUTH EVERY DAY  10/27/21  Yes Wendie Agreste, MD  ?metFORMIN (GLUCOPHAGE) 1000 MG tablet Take 1 tablet (1,000 mg total) by mouth 2 (two) times daily with a meal. 05/14/21  Yes Wendie Agreste, MD  ?omeprazole (PRILOSEC) 40 MG capsule TAKE ONE PILL ,40 MG, ONE HOUR BEFORE BREAKFAST. TAKE FOR AT LEAST 6 WEEKS TO PROMOTE ESOPHAGEAL HEALING. 03/20/21  Yes Pyrtle, Lajuan Lines, MD  ? ?Social History  ? ?Socioeconomic History  ? Marital status: Married  ?  Spouse name: Not on file  ? Number of children: 2  ? Years of education: Not on file  ? Highest education level: Not on file  ?Occupational History  ? Not on file  ?Tobacco Use  ? Smoking status: Never  ? Smokeless tobacco: Never  ?Vaping Use  ? Vaping Use: Never used  ?Substance and Sexual Activity  ?  Alcohol use: Yes  ?  Comment: weekends, occasional  ? Drug use: No  ? Sexual activity: Yes  ?Other Topics Concern  ? Not on file  ?Social History Narrative  ? Not on file  ? ?Social Determinants of Health  ? ?Financial Resource Strain: Not on file  ?Food Insecurity: Not on file  ?Transportation Needs: Not on file  ?Physical Activity: Not on file  ?Stress: Not on file  ?Social Connections: Not on file  ?Intimate Partner Violence: Not on file  ? ? ?Review of Systems ?13 point review of systems per patient health survey noted.  Negative other than as indicated above or in HPI.  ? ? ?Objective:  ? ?Vitals:  ? 11/11/21 1400  ?BP: 122/78  ?Pulse: 60  ?Resp: 16  ?Temp: 98 ?F (36.7 ?C)  ?TempSrc: Temporal  ?SpO2: 98%  ?Weight: 254 lb 9.6 oz (115.5 kg)  ?Height: _0  (1.93 m)  ? ? ? ?Physical Exam ?Vitals reviewed.  ?Constitutional:   ?   Appearance: He is well-developed.  ?HENT:  ?   Head: Normocephalic and atraumatic.  ?   Right Ear: External ear normal.  ?   Left Ear: External ear normal.  ?Eyes:  ?   Conjunctiva/sclera: Conjunctivae normal.  ?   Pupils: Pupils are equal, round, and reactive to light.  ?Neck:  ?   Thyroid: No thyromegaly.  ?Cardiovascular:  ?   Rate and Rhythm: Normal rate and  regular rhythm.  ?   Heart sounds: Normal heart sounds.  ?Pulmonary:  ?   Effort: Pulmonary effort is normal. No respiratory distress.  ?   Breath sounds: Normal breath sounds. No wheezing.  ?Abdominal

## 2021-11-12 LAB — COMPREHENSIVE METABOLIC PANEL
ALT: 44 U/L (ref 0–53)
AST: 24 U/L (ref 0–37)
Albumin: 4.8 g/dL (ref 3.5–5.2)
Alkaline Phosphatase: 59 U/L (ref 39–117)
BUN: 16 mg/dL (ref 6–23)
CO2: 26 mEq/L (ref 19–32)
Calcium: 9.2 mg/dL (ref 8.4–10.5)
Chloride: 100 mEq/L (ref 96–112)
Creatinine, Ser: 1.01 mg/dL (ref 0.40–1.50)
GFR: 85.27 mL/min (ref >60.00)
Glucose, Bld: 90 mg/dL (ref 70–99)
Potassium: 3.8 mEq/L (ref 3.5–5.1)
Sodium: 140 mEq/L (ref 135–145)
Total Bilirubin: 1.2 mg/dL (ref 0.2–1.2)
Total Protein: 7.6 g/dL (ref 6.0–8.3)

## 2021-11-12 LAB — LIPID PANEL
Cholesterol: 164 mg/dL (ref 0–200)
HDL: 38.3 mg/dL — ABNORMAL LOW (ref >39.00)
LDL Cholesterol: 97 mg/dL (ref 0–99)
NonHDL: 125.65
Total CHOL/HDL Ratio: 4
Triglycerides: 143 mg/dL (ref 0.0–149.0)
VLDL: 28.6 mg/dL (ref 0.0–40.0)

## 2021-11-12 LAB — TSH: TSH: 0.75 u[IU]/mL (ref 0.35–5.50)

## 2021-11-12 LAB — HEMOGLOBIN A1C: Hgb A1c MFr Bld: 6.9 % — ABNORMAL HIGH (ref 4.6–6.5)

## 2021-11-19 ENCOUNTER — Other Ambulatory Visit: Payer: Self-pay | Admitting: Family Medicine

## 2021-11-19 DIAGNOSIS — E1165 Type 2 diabetes mellitus with hyperglycemia: Secondary | ICD-10-CM

## 2022-05-01 ENCOUNTER — Other Ambulatory Visit: Payer: Self-pay | Admitting: Family Medicine

## 2022-05-01 DIAGNOSIS — I10 Essential (primary) hypertension: Secondary | ICD-10-CM

## 2022-05-13 ENCOUNTER — Encounter: Payer: Self-pay | Admitting: Family Medicine

## 2022-05-13 ENCOUNTER — Ambulatory Visit (INDEPENDENT_AMBULATORY_CARE_PROVIDER_SITE_OTHER): Payer: BC Managed Care – PPO | Admitting: Family Medicine

## 2022-05-13 VITALS — BP 122/88 | HR 68 | Temp 98.8°F | Ht 76.0 in | Wt 255.6 lb

## 2022-05-13 DIAGNOSIS — E785 Hyperlipidemia, unspecified: Secondary | ICD-10-CM

## 2022-05-13 DIAGNOSIS — E1165 Type 2 diabetes mellitus with hyperglycemia: Secondary | ICD-10-CM | POA: Diagnosis not present

## 2022-05-13 DIAGNOSIS — R1032 Left lower quadrant pain: Secondary | ICD-10-CM

## 2022-05-13 DIAGNOSIS — I10 Essential (primary) hypertension: Secondary | ICD-10-CM | POA: Diagnosis not present

## 2022-05-13 MED ORDER — ATORVASTATIN CALCIUM 20 MG PO TABS: ORAL_TABLET | ORAL | 1 refills | Status: DC

## 2022-05-13 MED ORDER — METFORMIN HCL 1000 MG PO TABS: 1000.0000 mg | ORAL_TABLET | Freq: Two times a day (BID) | ORAL | 1 refills | Status: DC

## 2022-05-13 MED ORDER — TRULICITY 0.75 MG/0.5ML ~~LOC~~ SOAJ: 0.7500 mg | SUBCUTANEOUS | 1 refills | Status: DC

## 2022-05-13 NOTE — Progress Notes (Signed)
Subjective:  Patient ID: Tyrone Lee, male    DOB: 1971-07-05  Age: 51 y.o. MRN: [id number]  CC:  Chief Complaint  Patient presents with   Hyperlipidemia   Diabetes   Hypertension   Abdominal Pain    Pt states he will start getting the abd pain and then have a bowel movement and then start to get clammy, and cold sweats and it hurts for a few days then it is gone     HPI Tyrone Lee presents for   Diabetes:  Stable on last check, treated with metformin, Trulicity.  He is on ACE inhibitor as well as statin. No new med side effects.  Home readings fasting: 100-120 Postprandial - none. Symptomatic lows. None.  Microalbumin: 11/06/2020 normal Optho, foot exam, pneumovax:  Due for ophthalmology exam - declines.  urine microalbumin  Flu vaccine: declined Covid booster scheduled at pharmacy today.  HIV/hep c screen: declines both.   Lab Results  Component Value Date   HGBA1C 6.9 (H) 11/11/2021   HGBA1C 7.4 (H) 05/14/2021   HGBA1C 6.8 (H) 11/06/2020   Lab Results  Component Value Date   MICROALBUR 1.2 11/06/2020   LDLCALC 97 11/11/2021   CREATININE 1.01 11/11/2021   Hyperlipidemia: Lipitor 20 mg daily. No new myalgias or side effects.  Lab Results  Component Value Date   CHOL 164 11/11/2021   HDL 38.30 (L) 11/11/2021   LDLCALC 97 11/11/2021   TRIG 143.0 11/11/2021   CHOLHDL 4 11/11/2021   Lab Results  Component Value Date   ALT 44 11/11/2021   AST 24 11/11/2021   ALKPHOS 59 11/11/2021   BILITOT 1.2 11/11/2021   Hypertension: Lisinopril 10 mg. Home readings: none. No new side effects.  No cough BP Readings from Last 3 Encounters:  05/13/22 122/88  11/11/21 122/78  09/10/21 (!) 152/70   Lab Results  Component Value Date   CREATININE 1.01 11/11/2021   Abdominal Pain: Episodic flair of LLQ abd pain, last episode 5 months ago. Prior one few months prior. Past 2 years. Acute feeling of needing to defecate initial day, large BM's liquid diarrhea,  no blood. Then sore in area for few days then resolves. No fever, n/v.  No unexplained night sweats, fevers or unexplained weight loss. Usually BM QD. Diarrhea with salad. Colonoscopy 01/29/21: Two 3 to 4 mm polyps in the ascending colon, removed with a cold snare. Resected and retrieved. - Small internal hemorrhoids with post-banding scars visible in the rectum. - The examination was otherwise normal.   History Patient Active Problem List   Diagnosis Date Noted   Hyperparathyroidism ([place]) 07/27/2018   Hypothyroidism 07/27/2018   Thyroid cancer ([place]) 07/19/2018   Past Medical History:  Diagnosis Date   Acid reflux    on meds   Diabetes mellitus without complication ([place])    on meds   Hemorrhoids    Hyperlipidemia    Hypertension    Past Surgical History:  Procedure Laterality Date   RADICAL NECK DISSECTION Right 07/19/2018   Procedure: RIGHT NECK DISSECTION;  Surgeon: Rozetta Nunnery, MD;  Location: [place] [facility];  Service: ENT;  Laterality: Right;   THYROIDECTOMY Right 07/19/2018   Procedure: TOTAL THYROIDECTOMY AND RIGHT NECK DISSECTION;  Surgeon: Rozetta Nunnery, MD;  Location: [place];  Service: ENT;  Laterality: Right;   No Known Allergies Prior to Admission medications   Medication Sig Start Date End Date Taking? Authorizing Provider  ACCU-CHEK GUIDE test strip TEST UP TO FOUR TIMES A  DAY AS DIRECTED (START WITH TESTING ONCE PER DAY) 04/21/18  Yes Wendie Agreste, MD  atorvastatin (LIPITOR) 20 MG tablet TAKE 1 TABLET BY MOUTH DAILY AT 6 PM. 11/11/21  Yes Wendie Agreste, MD  blood glucose meter kit and supplies Dispense based on patient and insurance preference. Use up to four times daily as directed. (FOR ICD-10 E10.9, E11.9). Once per day for now. 08/04/17  Yes Wendie Agreste, MD  CVS D3 50 MCG (2000 UT) CAPS TAKE 1 CAPSULE BY MOUTH EVERY DAY 09/04/19  Yes Renato Shin, MD  Dulaglutide (TRULICITY) 2.70 JJ/0.0XF SOPN Inject 0.75 mg into the skin once a week. 11/11/21   Yes Wendie Agreste, MD  levothyroxine (SYNTHROID) 150 MCG tablet Take 1 tablet (150 mcg total) by mouth daily. 09/12/21  Yes Renato Shin, MD  levothyroxine (SYNTHROID) 175 MCG tablet Take 175 mcg by mouth daily. 05/11/22  Yes [provider]  lisinopril (ZESTRIL) 10 MG tablet TAKE 1 TABLET BY MOUTH EVERY DAY 05/03/22  Yes Wendie Agreste, MD  metFORMIN (GLUCOPHAGE) 1000 MG tablet Take 1 tablet (1,000 mg total) by mouth 2 (two) times daily with a meal. 11/11/21  Yes Wendie Agreste, MD  omeprazole (PRILOSEC) 40 MG capsule TAKE ONE PILL ,40 MG, ONE HOUR BEFORE BREAKFAST. TAKE FOR AT LEAST 6 WEEKS TO PROMOTE ESOPHAGEAL HEALING. 03/20/21  Yes Pyrtle, Lajuan Lines, MD   Social History   Socioeconomic History   Marital status: Married    Spouse name: Not on file   Number of children: 2   Years of education: Not on file   Highest education level: Not on file  Occupational History   Not on file  Tobacco Use   Smoking status: Never   Smokeless tobacco: Never  Vaping Use   Vaping Use: Never used  Substance and Sexual Activity   Alcohol use: Yes    Comment: weekends, occasional   Drug use: No   Sexual activity: Yes  Other Topics Concern   Not on file  Social History Narrative   Not on file   Social Determinants of Health   Financial Resource Strain: Not on file  Food Insecurity: Not on file  Transportation Needs: Not on file  Physical Activity: Not on file  Stress: Not on file  Social Connections: Not on file  Intimate Partner Violence: Not on file    Review of Systems  Constitutional:  Negative for fatigue and unexpected weight change.  Eyes:  Negative for visual disturbance.  Respiratory:  Negative for cough, chest tightness and shortness of breath.   Cardiovascular:  Negative for chest pain, palpitations and leg swelling.  Gastrointestinal:  Negative for abdominal pain and blood in stool.  Neurological:  Negative for dizziness, light-headedness and headaches.      Objective:   Vitals:   05/13/22 1410  BP: 122/88  Pulse: 68  Temp: 98.8 F (37.1 C)  SpO2: 98%  Weight: 255 lb 9.6 oz (115.9 kg)  Height: _0  (1.93 m)     Physical Exam Vitals reviewed.  Constitutional:      Appearance: He is well-developed.  HENT:     Head: Normocephalic and atraumatic.  Neck:     Vascular: No carotid bruit or JVD.  Cardiovascular:     Rate and Rhythm: Normal rate and regular rhythm.     Heart sounds: Normal heart sounds. No murmur heard. Pulmonary:     Effort: Pulmonary effort is normal.     Breath sounds: Normal breath  sounds. No rales.  Musculoskeletal:     Right lower leg: No edema.     Left lower leg: No edema.  Skin:    General: Skin is warm and dry.  Neurological:     Mental Status: He is alert and oriented to person, place, and time.  Psychiatric:        Mood and Affect: Mood normal.        Assessment & Plan:  JAHKEEM KURKA is a 51 y.o. male . Essential hypertension -  Stable, tolerating current regimen. Medications refilled. Labs pending as above.   Type 2 diabetes mellitus with hyperglycemia, without long-term current use of insulin (HCC) - Plan: metFORMIN (GLUCOPHAGE) 1000 MG tablet, Microalbumin / creatinine urine ratio, Hemoglobin A1c, Dulaglutide (TRULICITY) 0.07 HQ/1.9XJ SOPN  -  Stable, tolerating current regimen. Medications refilled. Labs pending as above.   Hyperlipidemia, unspecified hyperlipidemia type - Plan: atorvastatin (LIPITOR) 20 MG tablet, Comprehensive metabolic panel, Lipid panel  -  Stable, tolerating current regimen. Medications refilled. Labs pending as above.   LLQ abdominal pain - Plan: CBC, Lipase  -Infrequent symptoms as above, left-sided, lower abdominal pain.  Nontender on exam today.  Last flare 5 months ago.  Associated with diarrhea, frequent bowel movement initially and then some residual discomfort for a few days.  Question colitis.  Last colonoscopy without diverticula.  No  nausea/vomiting but with left-sided pain we will check lipase, check CBC, CMP as above.  Advised to be seen at time of flare if it recurs, and can also discuss with GI if needed.  5-monthfollow-up, sooner during abdominal pain flare if recurs.  Meds ordered this encounter  Medications   atorvastatin (LIPITOR) 20 MG tablet    Sig: TAKE 1 TABLET BY MOUTH DAILY AT 6 PM.    Dispense:  90 tablet    Refill:  1   metFORMIN (GLUCOPHAGE) 1000 MG tablet    Sig: Take 1 tablet (1,000 mg total) by mouth 2 (two) times daily with a meal.    Dispense:  180 tablet    Refill:  1   Dulaglutide (TRULICITY) 08.83MGP/4.9IYSOPN    Sig: Inject 0.75 mg into the skin once a week.    Dispense:  6 mL    Refill:  1    Disregard 0.555mquantity rx - fill this one instead.   Patient Instructions  No med changes today.  I will check a few labs for the abdominal symptoms but because you are not actively having the symptoms at this time, this may or may not be helpful.  If abdominal pain returns, please be seen during that flare so we can evaluate that further.  Can also discuss with your gastroenterologist if needed but again being seen during the flare may be helpful for further evaluation.  Take care.    Return to the clinic or go to the nearest emergency room if any of your symptoms worsen or new symptoms occur.  Abdominal Pain, Adult Pain in the abdomen (abdominal pain) can be caused by many things. Often, abdominal pain is not serious and it gets better with no treatment or by being treated at home. However, sometimes abdominal pain is serious. Your health care provider will ask questions about your medical history and do a physical exam to try to determine the cause of your abdominal pain. Follow these instructions at home: Medicines Take over-the-counter and prescription medicines only as told by your health care provider. Do not take a laxative unless  told by your health care provider. General  instructions  Watch your condition for any changes. Drink enough fluid to keep your urine pale yellow. Keep all follow-up visits as told by your health care provider. This is important. Contact a health care provider if: Your abdominal pain changes or gets worse. You are not hungry or you lose weight without trying. You are constipated or have diarrhea for more than 2-3 days. You have pain when you urinate or have a bowel movement. Your abdominal pain wakes you up at night. Your pain gets worse with meals, after eating, or with certain foods. You are vomiting and cannot keep anything down. You have a fever. You have blood in your urine. Get help right away if: Your pain does not go away as soon as your health care provider told you to expect. You cannot stop vomiting. Your pain is only in areas of the abdomen, such as the right side or the left lower portion of the abdomen. Pain on the right side could be caused by appendicitis. You have bloody or black stools, or stools that look like tar. You have severe pain, cramping, or bloating in your abdomen. You have signs of dehydration, such as: Dark urine, very little urine, or no urine. Cracked lips. Dry mouth. Sunken eyes. Sleepiness. Weakness. You have trouble breathing or chest pain. Summary Often, abdominal pain is not serious and it gets better with no treatment or by being treated at home. However, sometimes abdominal pain is serious. Watch your condition for any changes. Take over-the-counter and prescription medicines only as told by your health care provider. Contact a health care provider if your abdominal pain changes or gets worse. Get help right away if you have severe pain, cramping, or bloating in your abdomen. This information is not intended to replace advice given to you by your health care provider. Make sure you discuss any questions you have with your health care provider. Document Revised: 08/10/2019 Document  Reviewed: 10/30/2018 Elsevier Patient Education  [place],   Merri Ray, MD [place], [facility] 05/13/22 3:02 PM

## 2022-05-13 NOTE — Patient Instructions (Signed)
No med changes today.  I will check a few labs for the abdominal symptoms but because you are not actively having the symptoms at this time, this may or may not be helpful.  If abdominal pain returns, please be seen during that flare so we can evaluate that further.  Can also discuss with your gastroenterologist if needed but again being seen during the flare may be helpful for further evaluation.  Take care.    Return to the clinic or go to the nearest emergency room if any of your symptoms worsen or new symptoms occur.  Abdominal Pain, Adult Pain in the abdomen (abdominal pain) can be caused by many things. Often, abdominal pain is not serious and it gets better with no treatment or by being treated at home. However, sometimes abdominal pain is serious. Your health care provider will ask questions about your medical history and do a physical exam to try to determine the cause of your abdominal pain. Follow these instructions at home: Medicines Take over-the-counter and prescription medicines only as told by your health care provider. Do not take a laxative unless told by your health care provider. General instructions  Watch your condition for any changes. Drink enough fluid to keep your urine pale yellow. Keep all follow-up visits as told by your health care provider. This is important. Contact a health care provider if: Your abdominal pain changes or gets worse. You are not hungry or you lose weight without trying. You are constipated or have diarrhea for more than 2-3 days. You have pain when you urinate or have a bowel movement. Your abdominal pain wakes you up at night. Your pain gets worse with meals, after eating, or with certain foods. You are vomiting and cannot keep anything down. You have a fever. You have blood in your urine. Get help right away if: Your pain does not go away as soon as your health care provider told you to expect. You cannot stop vomiting. Your pain is  only in areas of the abdomen, such as the right side or the left lower portion of the abdomen. Pain on the right side could be caused by appendicitis. You have bloody or black stools, or stools that look like tar. You have severe pain, cramping, or bloating in your abdomen. You have signs of dehydration, such as: Dark urine, very little urine, or no urine. Cracked lips. Dry mouth. Sunken eyes. Sleepiness. Weakness. You have trouble breathing or chest pain. Summary Often, abdominal pain is not serious and it gets better with no treatment or by being treated at home. However, sometimes abdominal pain is serious. Watch your condition for any changes. Take over-the-counter and prescription medicines only as told by your health care provider. Contact a health care provider if your abdominal pain changes or gets worse. Get help right away if you have severe pain, cramping, or bloating in your abdomen. This information is not intended to replace advice given to you by your health care provider. Make sure you discuss any questions you have with your health care provider. Document Revised: 08/10/2019 Document Reviewed: 10/30/2018 Elsevier Patient Education  [place].

## 2022-05-14 LAB — CBC
HCT: 40.4 % (ref 39.0–52.0)
Hemoglobin: 13.8 g/dL (ref 13.0–17.0)
MCHC: 34.3 g/dL (ref 30.0–36.0)
MCV: 89.2 fl (ref 78.0–100.0)
Platelets: 151 10*3/uL (ref 150.0–400.0)
RBC: 4.53 Mil/uL (ref 4.22–5.81)
RDW: 12.6 % (ref 11.5–15.5)
WBC: 5.3 10*3/uL (ref 4.0–10.5)

## 2022-05-14 LAB — COMPREHENSIVE METABOLIC PANEL
ALT: 38 U/L (ref 0–53)
AST: 21 U/L (ref 0–37)
Albumin: 4.6 g/dL (ref 3.5–5.2)
Alkaline Phosphatase: 46 U/L (ref 39–117)
BUN: 17 mg/dL (ref 6–23)
CO2: 29 mEq/L (ref 19–32)
Calcium: 9.2 mg/dL (ref 8.4–10.5)
Chloride: 104 mEq/L (ref 96–112)
Creatinine, Ser: 1.18 mg/dL (ref 0.40–1.50)
GFR: 70.5 mL/min (ref >60.00)
Glucose, Bld: 90 mg/dL (ref 70–99)
Potassium: 4.1 mEq/L (ref 3.5–5.1)
Sodium: 141 mEq/L (ref 135–145)
Total Bilirubin: 1.1 mg/dL (ref 0.2–1.2)
Total Protein: 7.1 g/dL (ref 6.0–8.3)

## 2022-05-14 LAB — MICROALBUMIN / CREATININE URINE RATIO
Creatinine,U: 176.6 mg/dL
Microalb Creat Ratio: 0.8 mg/g (ref 0.0–30.0)
Microalb, Ur: 1.4 mg/dL (ref 0.0–1.9)

## 2022-05-14 LAB — LIPID PANEL
Cholesterol: 147 mg/dL (ref 0–200)
HDL: 34.7 mg/dL — ABNORMAL LOW (ref >39.00)
LDL Cholesterol: 86 mg/dL (ref 0–99)
NonHDL: 112.38
Total CHOL/HDL Ratio: 4
Triglycerides: 133 mg/dL (ref 0.0–149.0)
VLDL: 26.6 mg/dL (ref 0.0–40.0)

## 2022-05-14 LAB — LIPASE: Lipase: 55 U/L (ref 11.0–59.0)

## 2022-05-14 LAB — HEMOGLOBIN A1C: Hgb A1c MFr Bld: 7.6 % — ABNORMAL HIGH (ref 4.6–6.5)

## 2022-05-23 ENCOUNTER — Other Ambulatory Visit: Payer: Self-pay | Admitting: Family Medicine

## 2022-05-23 DIAGNOSIS — E1165 Type 2 diabetes mellitus with hyperglycemia: Secondary | ICD-10-CM

## 2022-05-23 MED ORDER — TRULICITY 1.5 MG/0.5ML ~~LOC~~ SOAJ: 1.5000 mg | SUBCUTANEOUS | 1 refills | Status: DC

## 2022-05-23 NOTE — Progress Notes (Signed)
See labs 

## 2022-08-27 ENCOUNTER — Other Ambulatory Visit: Payer: Self-pay | Admitting: Family Medicine

## 2022-08-27 DIAGNOSIS — I10 Essential (primary) hypertension: Secondary | ICD-10-CM

## 2022-09-24 ENCOUNTER — Ambulatory Visit (INDEPENDENT_AMBULATORY_CARE_PROVIDER_SITE_OTHER): Payer: BC Managed Care – PPO | Admitting: Internal Medicine

## 2022-09-24 ENCOUNTER — Encounter: Payer: Self-pay | Admitting: Internal Medicine

## 2022-09-24 VITALS — BP 124/82 | HR 83 | Ht 76.0 in | Wt 244.0 lb

## 2022-09-24 DIAGNOSIS — E890 Postprocedural hypothyroidism: Secondary | ICD-10-CM | POA: Diagnosis not present

## 2022-09-24 DIAGNOSIS — Z85850 Personal history of malignant neoplasm of thyroid: Secondary | ICD-10-CM

## 2022-09-24 NOTE — Progress Notes (Unsigned)
Name: Tyrone Lee  MRN/ DOB: [id number], October 29, 1970    Age/ Sex: 52 y.o., male     PCP: Wendie Agreste, MD   Reason for Endocrinology Evaluation: PTC/Hypothyroidism     Initial Endocrinology Clinic Visit: 07/27/2018    PATIENT IDENTIFIER: Tyrone Lee is a 52 y.o., male with a past medical history of hx of PTC. He has followed with [facility] since 07/27/2018 for consultative assistance with management of his Hx of PTC and postoperative Hypothyroidism.   HISTORICAL SUMMARY:  1/20: thyroidectomy and RND: multifocal PTC, largest focus is 3.2 cm (T2N1).   2/20: RAI, 127 mCi 2/20: post-therapy scan: small focus in the neck.   8/20 Tg undetectable (ab pos).   8/21 Tg undetectable (ab pos). 9/21 [place] no nodule or thyroid tissue 3/22 Tg undetectable (ab pos) 9/22 Tg undetectable (ab pos).   Hx of Hyperparathyroidism, dx'ed 2019, parathyroid adenoma was removed during thyroid surgery and his calcium has normalized since    Patient was followed by Dr. Loanne Drilling from 2020 until 09/2021    SUBJECTIVE:    Today (09/24/2022):  Tyrone Lee is here for a follow-up on postoperative hypothyroid and Hx of PTC.  Denies local neck swelling  Has onted with weight loss Denies diarrhea or loose stools Denies tremors Denies palpitations   Levothyroxine 150 mcg daily  Vitamin D 1000 iu daily   HISTORY:  Past Medical History:  Past Medical History:  Diagnosis Date   Acid reflux    on meds   Diabetes mellitus without complication ([place])    on meds   Hemorrhoids    Hyperlipidemia    Hypertension    Past Surgical History:  Past Surgical History:  Procedure Laterality Date   RADICAL NECK DISSECTION Right 07/19/2018   Procedure: RIGHT NECK DISSECTION;  Surgeon: Rozetta Nunnery, MD;  Location: [facility];  Service: ENT;  Laterality: Right;   THYROIDECTOMY Right 07/19/2018   Procedure: TOTAL THYROIDECTOMY AND RIGHT NECK DISSECTION;  Surgeon: Rozetta Nunnery, MD;  Location: [facility];  Service: ENT;  Laterality: Right;   Social History:  reports that he has never smoked. He has never used smokeless tobacco. He reports current alcohol use. He reports that he does not use drugs. Family History:  Family History  Problem Relation Age of Onset   Alcoholism Mother    Drug abuse Mother    Alcoholism Father    Lung cancer Maternal Grandfather    Lung cancer Paternal Grandfather    Diabetes Maternal Grandmother    Colon cancer Neg Hx    Stomach cancer Neg Hx    Esophageal cancer Neg Hx    Thyroid disease Neg Hx      HOME MEDICATIONS: Allergies as of 09/24/2022   No Known Allergies      Medication List        Accurate as of September 24, 2022  2:43 PM. If you have any questions, ask your nurse or doctor.          Accu-Chek Guide test strip Generic drug: glucose blood TEST UP TO FOUR TIMES A DAY AS DIRECTED (START WITH TESTING ONCE PER DAY)   atorvastatin 20 MG tablet Commonly known as: LIPITOR TAKE 1 TABLET BY MOUTH DAILY AT 6 PM.   blood glucose meter kit and supplies Dispense based on patient and insurance preference. Use up to four times daily as directed. (FOR ICD-10 E10.9, E11.9). Once per day for now.   [employer] D3 50  MCG (2000 UT) Caps Generic drug: Cholecalciferol TAKE 1 CAPSULE BY MOUTH EVERY DAY   levothyroxine 150 MCG tablet Commonly known as: SYNTHROID Take 1 tablet (150 mcg total) by mouth daily. What changed: Another medication with the same name was removed. Continue taking this medication, and follow the directions you see here. Changed by: Dorita Sciara, MD   lisinopril 10 MG tablet Commonly known as: ZESTRIL TAKE 1 TABLET BY MOUTH EVERY DAY   metFORMIN 1000 MG tablet Commonly known as: GLUCOPHAGE Take 1 tablet (1,000 mg total) by mouth 2 (two) times daily with a meal.   omeprazole 40 MG capsule Commonly known as: PRILOSEC TAKE ONE PILL ,40 MG, ONE HOUR BEFORE BREAKFAST. TAKE FOR AT LEAST 6  WEEKS TO PROMOTE ESOPHAGEAL HEALING.   Trulicity 1.5 0000000 Sopn Generic drug: Dulaglutide Inject 1.5 mg into the skin once a week.          OBJECTIVE:   PHYSICAL EXAM: VS: BP 124/82 (BP Location: Left Arm, Patient Position: Sitting, Cuff Size: Large)   Pulse 83   Ht 6\' 4"  (1.93 m)   Wt 244 lb (110.7 kg)   SpO2 99%   BMI 29.70 kg/m    EXAM: General: Pt appears well and is in NAD  Eyes: External eye exam normal without stare, lid lag or exophthalmos.  EOM intact.    Neck: General: Supple without adenopathy. Thyroid: T  No goiter or nodules appreciated.  Lungs: Clear with good BS bilat with no rales, rhonchi, or wheezes  Heart: Auscultation: RRR.  Abdomen: soft, nontender, without masses or organomegaly palpable  Extremities:  BL LE: No pretibial edema normal ROM and strength.  Mental Status: Judgment, insight: Intact Orientation: Oriented to time, place, and person Mood and affect: No depression, anxiety, or agitation     DATA REVIEWED: ***  Thyroid ultrasound 03/12/2020  FINDINGS: The thyroid gland is surgically absent. No residual thyroid tissue identified on today's exam. A few small cervical lymph nodes are noted.   IMPRESSION: Status post thyroidectomy without evidence for residual thyroid tissue. No concerning cervical lymph nodes identified on today's study     Thyroid Pathology 07/19/2018   1. Thyroid, lobectomy, Right lobe - PAPILLARY CARCINOMA, MULTIFOCAL, LARGEST TUMOR IS 3.2 CM. - EXTRATHYROIDAL EXTENSION IS PRESENT. - MARGINS OF RESECTION ARE NOT INVOLVED. - METASTATIC CARCINOMA PRESENT IN ONE OF ONE LYMPH NODE (1/1). - SEE ONCOLOGY TABLE. 2. Lymph node, biopsy, Inferior Right - METASTATIC CARCINOMA PRESENT IN ONE OF SEVEN LYMPH NODES (1/7). 3. Thyroid, lobectomy, Left and central neck - CHRONIC LYMPHOCYTIC THYROIDITIS. - NO CARCINOMA IDENTIFIED IN EIGHT LYMPH NODES (0/8). 4. Parathyroid gland, Questionable Right - HYPERCELLULAR  PARATHYROID TISSUE, 1.5 GRAMS. 5. Lymph nodes, radical neck dissection - METASTATIC CARCINOMA PRESENT IN THREE OF FIFTEEN LYMPH NODES (3/15). - EXTRANODAL EXTENSION IS PRESENT. Microscopic Comment 1. THYROID GLAND: Procedure: Total thyroidectomy Tumor Focality: Multifocal Tumor Site: Right lobe Tumor Size: 3.2 cm Histologic Type: Papillary carcinoma, classic (usual, conventional) Margins: Uninvolved by carcinoma Distance of invasive carcinoma from closest margin: Less than 1 mm from the black ink Angioinvasion: Not identified Lymphatic Invasion: Present Extrathyroidal Extension: Present, microscopic Regional Lymph Nodes:   Microscopic Comment(continued) Number of Lymph Nodes Involved: 5 Nodal Levels Involved: One lymph node at the superior aspect of the right thyroid lobectomy (specimen 1) One lymph node from the specimen submitted as inferior right (specimen 2) Three lymph nodes from the radical neck dissection (specimen 5) Size of Largest Metastatic Deposit: 3.5 cm Extranodal Extension (ENE):  Present Number of Lymph Nodes Examined: 31 Nodal Levels Examined: One lymph node at the superior aspect of the right thyroid lobectomy (specimen 1) Seven lymph nodes from the specimen submitted as inferior right (specimen 2) Eight lymph nodes from the specimen submitted as left and central neck thyroidectomy (specimen 3) Fifteen lymph nodes from the radical neck dissection (specimen 5) Pathologic Stage Classification (pTNM, AJCC 8th Edition): mpT2, pN1  Old records , labs and images have been reviewed.   ASSESSMENT / PLAN / RECOMMENDATIONS:   Hx PTC:  -Patient was at high risk for recurrence based on metastasis to a lymph node of 3.5 cm in largest dimension on his initial pathology report -Historically he has had positive TG antibodies, we will proceed with TG/TG AB to LCM -I did recommend proceeding with thyroid bed ultrasound, patient was asking if this was necessary, I did explain  to the patient that with his high risk of recurrence based on metastases and a positive TG antibody makes it difficult for me to assess his risk at this time without having full data, also his last ultrasound was 2 years ago and will be beneficial to have an updated ultrasound  - TSH goal 0.1- ***   2.  Postoperative hypothyroid:  -Patient is clinically euthyroid - Pt educated extensively on the correct way to take levothyroxine (first thing in the morning with water, 30 minutes before eating or taking other medications). - Pt encouraged to double dose the following day if she were to miss a dose given long half-life of levothyroxine.   Medication   F/U in 6 months     Signed electronically by: Mack Guise, MD  [facility] Endocrinology  [address] [place], [place] [address] Phone: [phone] FAX: [phone]      CC: Wendie Agreste, MD [address] [place] [address] Phone: [phone]  Fax: [phone]   Return to Endocrinology clinic as below: Future Appointments  Date Time Provider [place]  11/11/2022  3:00 PM Wendie Agreste, MD [facility]

## 2022-09-24 NOTE — Patient Instructions (Signed)

## 2022-09-27 ENCOUNTER — Encounter: Payer: Self-pay | Admitting: Internal Medicine

## 2022-09-27 MED ORDER — LEVOTHYROXINE SODIUM 150 MCG PO TABS: 150.0000 ug | ORAL_TABLET | Freq: Every day | ORAL | 3 refills | Status: DC

## 2022-10-03 LAB — THYROGLOBULIN BY LCMS: Thyroglobulin by LCMS: <0.2 ng/mL — ABNORMAL LOW (ref 1.4–29.2)

## 2022-10-03 LAB — TSH: TSH: 0.18 u[IU]/mL — ABNORMAL LOW (ref 0.450–4.500)

## 2022-10-03 LAB — TGAB+THYROGLOBULIN IMA OR LCMS: Thyroglobulin Antibody: 56.9 IU/mL — ABNORMAL HIGH (ref 0.0–0.9)

## 2022-10-25 ENCOUNTER — Ambulatory Visit
Admission: RE | Admit: 2022-10-25 | Discharge: 2022-10-25 | Disposition: A | Discharge status: Home / Self Care | Payer: BC Managed Care – PPO | Source: Ambulatory Visit | Attending: Internal Medicine | Admitting: Internal Medicine

## 2022-10-25 DIAGNOSIS — Z85850 Personal history of malignant neoplasm of thyroid: Secondary | ICD-10-CM

## 2022-10-25 DIAGNOSIS — E890 Postprocedural hypothyroidism: Secondary | ICD-10-CM | POA: Diagnosis not present

## 2022-11-04 ENCOUNTER — Other Ambulatory Visit: Payer: Self-pay | Admitting: Family Medicine

## 2022-11-04 DIAGNOSIS — E1165 Type 2 diabetes mellitus with hyperglycemia: Secondary | ICD-10-CM

## 2022-11-06 ENCOUNTER — Other Ambulatory Visit: Payer: Self-pay | Admitting: Family Medicine

## 2022-11-06 DIAGNOSIS — E785 Hyperlipidemia, unspecified: Secondary | ICD-10-CM

## 2022-11-11 ENCOUNTER — Ambulatory Visit (INDEPENDENT_AMBULATORY_CARE_PROVIDER_SITE_OTHER): Payer: BC Managed Care – PPO | Admitting: Family Medicine

## 2022-11-11 ENCOUNTER — Encounter: Payer: Self-pay | Admitting: Family Medicine

## 2022-11-11 VITALS — BP 130/86 | HR 66 | Temp 98.7°F | Ht 76.0 in | Wt 233.4 lb

## 2022-11-11 DIAGNOSIS — Z7985 Long-term (current) use of injectable non-insulin antidiabetic drugs: Secondary | ICD-10-CM

## 2022-11-11 DIAGNOSIS — E785 Hyperlipidemia, unspecified: Secondary | ICD-10-CM | POA: Diagnosis not present

## 2022-11-11 DIAGNOSIS — I10 Essential (primary) hypertension: Secondary | ICD-10-CM | POA: Insufficient documentation

## 2022-11-11 DIAGNOSIS — E1165 Type 2 diabetes mellitus with hyperglycemia: Secondary | ICD-10-CM | POA: Insufficient documentation

## 2022-11-11 DIAGNOSIS — Z7984 Long term (current) use of oral hypoglycemic drugs: Secondary | ICD-10-CM

## 2022-11-11 DIAGNOSIS — N529 Male erectile dysfunction, unspecified: Secondary | ICD-10-CM

## 2022-11-11 MED ORDER — TRULICITY 1.5 MG/0.5ML ~~LOC~~ SOAJ
1.5000 mg | SUBCUTANEOUS | 1 refills | Status: DC
Start: 2022-11-11 — End: 2023-05-19

## 2022-11-11 MED ORDER — SILDENAFIL CITRATE 50 MG PO TABS: 25.0000 mg | ORAL_TABLET | Freq: Every day | ORAL | 5 refills | Status: DC | PRN

## 2022-11-11 MED ORDER — LISINOPRIL 10 MG PO TABS: 10.0000 mg | ORAL_TABLET | Freq: Every day | ORAL | 1 refills | Status: DC

## 2022-11-11 NOTE — Assessment & Plan Note (Signed)
Recently refilled meds.  Stable, tolerating current regimen. Labs pending as above.

## 2022-11-11 NOTE — Progress Notes (Signed)
Subjective:  Patient ID: Tyrone Lee, male    DOB: [birth year]  Age: 52 y.o. MRN: [id number]  CC:  Chief Complaint  Patient presents with   Hypertension   Diabetes    HPI Tyrone Lee [facility] presents for   Hypertension: Lisinopril 10 mg daily, no home readings  No side effects with meds.  BP Readings from Last 3 Encounters:  11/11/22 130/86  09/24/22 124/82  05/13/22 122/88   Lab Results  Component Value Date   CREATININE 1.18 05/13/2022   Postoperative hypothyroidism History of papillary thyroid cancer, thyroidectomy in 2020.  History of hyperparathyroidism diagnosed in 2019, parathyroid adenoma removed during thyroid surgery and calcium normalized.  Endocrine visit March 22.  Dr. Lonzo Cloud.  Repeat thyroid ultrasound, thyroglobulin <0.2,  TSH 0.180.  Continued levothyroxine 150 mcg daily.  Thyroid ultrasound April 22, no evidence of thyroid cancer recurrence.  Diabetes: Treated with metformin, Trulicity, he is on ACE inhibitor and statin Home readings fasting: no recent readings. No symptomatic low symptoms.  Microalbumin: Normal 05/13/2022 Optho, foot exam, pneumovax:  Ophthalmology exam - due - refuses.  Hep C, HIV screening - declines.   Lab Results  Component Value Date   HGBA1C 7.6 (H) 05/13/2022   HGBA1C 6.9 (H) 11/11/2021   HGBA1C 7.4 (H) 05/14/2021   Lab Results  Component Value Date   MICROALBUR 1.4 05/13/2022   LDLCALC 86 05/13/2022   CREATININE 1.18 05/13/2022   Hyperlipidemia: Lipitor 20mg  qd. No new myalgias. Fasting.  Lab Results  Component Value Date   CHOL 147 05/13/2022   HDL 34.70 (L) 05/13/2022   LDLCALC 86 05/13/2022   TRIG 133.0 05/13/2022   CHOLHDL 4 05/13/2022   Lab Results  Component Value Date   ALT 38 05/13/2022   AST 21 05/13/2022   ALKPHOS 46 05/13/2022   BILITOT 1.1 05/13/2022   Erectile dysfunction: Difficulty with erections past year. Some difficulty with onset and maintenance. Some erections, not same tumescence.   Starting to date again, no prior use of viagra/meds.  No otc treatments. No personal history of heart dz or CP/dyspnea with exertion. Would like to try viagra.      History Patient Active Problem List   Diagnosis Date Noted   Type 2 diabetes mellitus with hyperglycemia, without long-term current use of insulin (HCC) 11/11/2022   Hyperlipidemia 11/11/2022   Essential hypertension 11/11/2022   Erectile dysfunction 11/11/2022   Hx of papillary thyroid carcinoma 09/24/2022   Hyperparathyroidism (HCC) 07/27/2018   Hypothyroidism 07/27/2018   Thyroid cancer (HCC) 07/19/2018    Past Medical History:  Diagnosis Date   Acid reflux    on meds   Diabetes mellitus without complication (HCC)    on meds   Hemorrhoids    Hyperlipidemia    Hypertension       Review of Systems  Constitutional:  Negative for fatigue and unexpected weight change.  Eyes:  Negative for visual disturbance.  Respiratory:  Negative for cough, chest tightness and shortness of breath.   Cardiovascular:  Negative for chest pain, palpitations and leg swelling.  Gastrointestinal:  Negative for abdominal pain and blood in stool.  Neurological:  Negative for dizziness, light-headedness and headaches.     Objective:   Vitals:   11/11/22 1448  BP: 130/86  Pulse: 66  Temp: 98.7 F (37.1 C)  TempSrc: Temporal  SpO2: 100%  Weight: 233 lb 6.4 oz (105.9 kg)  Height: 6\' 4"  (1.93 m)     Physical Exam Vitals reviewed.  Constitutional:  Appearance: He is well-developed.  HENT:     Head: Normocephalic and atraumatic.  Neck:     Vascular: No carotid bruit or JVD.  Cardiovascular:     Rate and Rhythm: Normal rate and regular rhythm.     Heart sounds: Normal heart sounds. No murmur heard. Pulmonary:     Effort: Pulmonary effort is normal.     Breath sounds: Normal breath sounds. No rales.  Musculoskeletal:     Right lower leg: No edema.     Left lower leg: No edema.  Skin:    General: Skin is warm  and dry.  Neurological:     Mental Status: He is alert and oriented to person, place, and time.  Psychiatric:        Mood and Affect: Mood normal.        Assessment & Plan:  Tyrone Lee is a 52 y.o. male . Type 2 diabetes mellitus with hyperglycemia, without long-term current use of insulin (HCC) Assessment & Plan:  Stable, tolerating current regimen. Medications refilled. Labs pending as above.  Plan adjustment accordingly based on results, 53-month follow-up scheduled if A1c stable, 3 months if elevated.  Continue metformin, Trulicity same doses for now  Orders: -     Hemoglobin A1c -     Trulicity; Inject 1.5 mg into the skin once a week.  Dispense: 6 mL; Refill: 1  Hyperlipidemia, unspecified hyperlipidemia type Assessment & Plan: Recently refilled meds.  Stable, tolerating current regimen. Labs pending as above.    Orders: -     Lipid panel  Essential hypertension Assessment & Plan:  Stable, tolerating current regimen. Medications refilled. Labs pending as above.    Orders: -     Comprehensive metabolic panel -     Lisinopril; Take 1 tablet (10 mg total) by mouth daily.  Dispense: 90 tablet; Refill: 1  Erectile dysfunction, unspecified erectile dysfunction type Assessment & Plan: viagra Rx given - use lowest effective dose. Side effects discussed (including but not limited to headache/flushing, blue discoloration of vision, possible vascular steal and risk of cardiac effects if underlying unknown coronary artery disease, and permanent sensorineural hearing loss). Understanding expressed.   Orders: -     Sildenafil Citrate; Take 0.5-1 tablets (25-50 mg total) by mouth daily as needed for erectile dysfunction.  Dispense: 10 tablet; Refill: 5    Patient Instructions  No med changes today.  I will let you know if any concerns on labs.  Start with lowest effective dose of sildenafil, and let me know if that is not tolerated or ineffective.  Erectile  Dysfunction Erectile dysfunction (ED) is the inability to get or keep an erection in order to have sexual intercourse. ED is considered a symptom of an underlying disorder and is not considered a disease. ED may include: Inability to get an erection. Lack of enough hardness of the erection to allow penetration. Loss of erection before sex is finished. What are the causes? This condition may be caused by: Physical causes, such as: Artery problems. This may include heart disease, high blood pressure, atherosclerosis, and diabetes. Hormonal problems, such as low testosterone. Obesity. Nerve problems. This may include back or pelvic injuries, multiple sclerosis, Parkinson's disease, spinal cord injury, and stroke. Certain medicines, such as: Pain relievers. Antidepressants. Blood pressure medicines and water pills (diuretics). Cancer medicines. Antihistamines. Muscle relaxants. Lifestyle factors, such as: Use of drugs such as marijuana, cocaine, or opioids. Excessive use of alcohol. Smoking. Lack of physical activity or exercise. Psychological  causes, such as: Anxiety or stress. Sadness or depression. Exhaustion. Fear about sexual performance. Guilt. What are the signs or symptoms? Symptoms of this condition include: Inability to get an erection. Lack of enough hardness of the erection to allow penetration. Loss of the erection before sex is finished. Sometimes having normal erections, but with frequent unsatisfactory episodes. Low sexual satisfaction in either partner due to erection problems. A curved penis occurring with erection. The curve may cause pain, or the penis may be too curved to allow for intercourse. Never having nighttime or morning erections. How is this diagnosed? This condition is often diagnosed by: Performing a physical exam to find other diseases or specific problems with the penis. Asking you detailed questions about the problem. Doing tests, such  as: Blood tests to check for diabetes mellitus or high cholesterol, or to measure hormone levels. Other tests to check for underlying health conditions. An ultrasound exam to check for scarring. A test to check blood flow to the penis. Doing a sleep study at home to measure nighttime erections. How is this treated? This condition may be treated by: Medicines, such as: Medicine taken by mouth to help you achieve an erection (oral medicine). Hormone replacement therapy to replace low testosterone levels. Medicine that is injected into the penis. Your health care provider may instruct you how to give yourself these injections at home. Medicine that is delivered with a short applicator tube. The tube is inserted into the opening at the tip of the penis, which is the opening of the urethra. A tiny pellet of medicine is put in the urethra. The pellet dissolves and enhances erectile function. This is also called MUSE (medicated urethral system for erections) therapy. Vacuum pump. This is a pump with a ring on it. The pump and ring are placed on the penis and used to create pressure that helps the penis become erect. Penile implant surgery. In this procedure, you may receive: An inflatable implant. This consists of cylinders, a pump, and a reservoir. The cylinders can be inflated with a fluid that helps to create an erection, and they can be deflated after intercourse. A semi-rigid implant. This consists of two silicone rubber rods. The rods provide some rigidity. They are also flexible, so the penis can both curve downward in its normal position and become straight for sexual intercourse. Blood vessel surgery to improve blood flow to the penis. During this procedure, a blood vessel from a different part of the body is placed into the penis to allow blood to flow around (bypass) damaged or blocked blood vessels. Lifestyle changes, such as exercising more, losing weight, and quitting smoking. Follow these  instructions at home: Medicines  Take over-the-counter and prescription medicines only as told by your health care provider. Do not increase the dosage without first discussing it with your health care provider. If you are using self-injections, do injections as directed by your health care provider. Make sure you avoid any veins that are on the surface of the penis. After giving an injection, apply pressure to the injection site for 5 minutes. Talk to your health care provider about how to prevent headaches while taking ED medicines. These medicines may cause a sudden headache due to the increase in blood flow in your body. General instructions Exercise regularly, as directed by your health care provider. Work with your health care provider to lose weight, if needed. Do not use any products that contain nicotine or tobacco. These products include cigarettes, chewing tobacco, and  vaping devices, such as e-cigarettes. If you need help quitting, ask your health care provider. Before using a vacuum pump, read the instructions that come with the pump and discuss any questions with your health care provider. Keep all follow-up visits. This is important. Contact a health care provider if: You feel nauseous. You are vomiting. You get sudden headaches while taking ED medicines. You have any concerns about your sexual health. Get help right away if: You are taking oral or injectable medicines and you have an erection that lasts longer than 4 hours. If your health care provider is unavailable, go to the nearest emergency room for evaluation. An erection that lasts much longer than 4 hours can result in permanent damage to your penis. You have severe pain in your groin or abdomen. You develop redness or severe swelling of your penis. You have redness spreading at your groin or lower abdomen. You are unable to urinate. You experience chest pain or a rapid heartbeat (palpitations) after taking oral  medicines. These symptoms may represent a serious problem that is an emergency. Do not wait to see if the symptoms will go away. Get medical help right away. Call your local emergency services (911 in the [place] Do not drive yourself to the hospital. Summary Erectile dysfunction (ED) is the inability to get or keep an erection during sexual intercourse. This condition is diagnosed based on a physical exam, your symptoms, and tests to determine the cause. Treatment varies depending on the cause and may include medicines, hormone therapy, surgery, or a vacuum pump. You may need follow-up visits to make sure that you are using your medicines or devices correctly. Get help right away if you are taking or injecting medicines and you have an erection that lasts longer than 4 hours. This information is not intended to replace advice given to you by your health care provider. Make sure you discuss any questions you have with your health care provider. Document Revised: 09/17/2020 Document Reviewed: 09/17/2020 Elsevier Patient Education  2023 [employer]     Signed,   Meredith Staggers, MD [facility], [facility] 11/11/22 3:46 PM

## 2022-11-11 NOTE — Patient Instructions (Signed)
No med changes today.  I will let you know if any concerns on labs.  Start with lowest effective dose of sildenafil, and let me know if that is not tolerated or ineffective.  Erectile Dysfunction Erectile dysfunction (ED) is the inability to get or keep an erection in order to have sexual intercourse. ED is considered a symptom of an underlying disorder and is not considered a disease. ED may include: Inability to get an erection. Lack of enough hardness of the erection to allow penetration. Loss of erection before sex is finished. What are the causes? This condition may be caused by: Physical causes, such as: Artery problems. This may include heart disease, high blood pressure, atherosclerosis, and diabetes. Hormonal problems, such as low testosterone. Obesity. Nerve problems. This may include back or pelvic injuries, multiple sclerosis, Parkinson's disease, spinal cord injury, and stroke. Certain medicines, such as: Pain relievers. Antidepressants. Blood pressure medicines and water pills (diuretics). Cancer medicines. Antihistamines. Muscle relaxants. Lifestyle factors, such as: Use of drugs such as marijuana, cocaine, or opioids. Excessive use of alcohol. Smoking. Lack of physical activity or exercise. Psychological causes, such as: Anxiety or stress. Sadness or depression. Exhaustion. Fear about sexual performance. Guilt. What are the signs or symptoms? Symptoms of this condition include: Inability to get an erection. Lack of enough hardness of the erection to allow penetration. Loss of the erection before sex is finished. Sometimes having normal erections, but with frequent unsatisfactory episodes. Low sexual satisfaction in either partner due to erection problems. A curved penis occurring with erection. The curve may cause pain, or the penis may be too curved to allow for intercourse. Never having nighttime or morning erections. How is this diagnosed? This condition  is often diagnosed by: Performing a physical exam to find other diseases or specific problems with the penis. Asking you detailed questions about the problem. Doing tests, such as: Blood tests to check for diabetes mellitus or high cholesterol, or to measure hormone levels. Other tests to check for underlying health conditions. An ultrasound exam to check for scarring. A test to check blood flow to the penis. Doing a sleep study at home to measure nighttime erections. How is this treated? This condition may be treated by: Medicines, such as: Medicine taken by mouth to help you achieve an erection (oral medicine). Hormone replacement therapy to replace low testosterone levels. Medicine that is injected into the penis. Your health care provider may instruct you how to give yourself these injections at home. Medicine that is delivered with a short applicator tube. The tube is inserted into the opening at the tip of the penis, which is the opening of the urethra. A tiny pellet of medicine is put in the urethra. The pellet dissolves and enhances erectile function. This is also called MUSE (medicated urethral system for erections) therapy. Vacuum pump. This is a pump with a ring on it. The pump and ring are placed on the penis and used to create pressure that helps the penis become erect. Penile implant surgery. In this procedure, you may receive: An inflatable implant. This consists of cylinders, a pump, and a reservoir. The cylinders can be inflated with a fluid that helps to create an erection, and they can be deflated after intercourse. A semi-rigid implant. This consists of two silicone rubber rods. The rods provide some rigidity. They are also flexible, so the penis can both curve downward in its normal position and become straight for sexual intercourse. Blood vessel surgery to improve blood flow  to the penis. During this procedure, a blood vessel from a different part of the body is placed into  the penis to allow blood to flow around (bypass) damaged or blocked blood vessels. Lifestyle changes, such as exercising more, losing weight, and quitting smoking. Follow these instructions at home: Medicines  Take over-the-counter and prescription medicines only as told by your health care provider. Do not increase the dosage without first discussing it with your health care provider. If you are using self-injections, do injections as directed by your health care provider. Make sure you avoid any veins that are on the surface of the penis. After giving an injection, apply pressure to the injection site for 5 minutes. Talk to your health care provider about how to prevent headaches while taking ED medicines. These medicines may cause a sudden headache due to the increase in blood flow in your body. General instructions Exercise regularly, as directed by your health care provider. Work with your health care provider to lose weight, if needed. Do not use any products that contain nicotine or tobacco. These products include cigarettes, chewing tobacco, and vaping devices, such as e-cigarettes. If you need help quitting, ask your health care provider. Before using a vacuum pump, read the instructions that come with the pump and discuss any questions with your health care provider. Keep all follow-up visits. This is important. Contact a health care provider if: You feel nauseous. You are vomiting. You get sudden headaches while taking ED medicines. You have any concerns about your sexual health. Get help right away if: You are taking oral or injectable medicines and you have an erection that lasts longer than 4 hours. If your health care provider is unavailable, go to the nearest emergency room for evaluation. An erection that lasts much longer than 4 hours can result in permanent damage to your penis. You have severe pain in your groin or abdomen. You develop redness or severe swelling of your  penis. You have redness spreading at your groin or lower abdomen. You are unable to urinate. You experience chest pain or a rapid heartbeat (palpitations) after taking oral medicines. These symptoms may represent a serious problem that is an emergency. Do not wait to see if the symptoms will go away. Get medical help right away. Call your local emergency services (911 in the [place] Do not drive yourself to the hospital. Summary Erectile dysfunction (ED) is the inability to get or keep an erection during sexual intercourse. This condition is diagnosed based on a physical exam, your symptoms, and tests to determine the cause. Treatment varies depending on the cause and may include medicines, hormone therapy, surgery, or a vacuum pump. You may need follow-up visits to make sure that you are using your medicines or devices correctly. Get help right away if you are taking or injecting medicines and you have an erection that lasts longer than 4 hours. This information is not intended to replace advice given to you by your health care provider. Make sure you discuss any questions you have with your health care provider. Document Revised: 09/17/2020 Document Reviewed: 09/17/2020 Elsevier Patient Education  2023 [employer].

## 2022-11-11 NOTE — Assessment & Plan Note (Signed)
Stable, tolerating current regimen. Medications refilled. Labs pending as above.   

## 2022-11-11 NOTE — Assessment & Plan Note (Signed)
Stable, tolerating current regimen. Medications refilled. Labs pending as above.  Plan adjustment accordingly based on results, 51-month follow-up scheduled if A1c stable, 3 months if elevated.  Continue metformin, Trulicity same doses for now

## 2022-11-11 NOTE — Assessment & Plan Note (Signed)
viagra Rx given - use lowest effective dose. Side effects discussed (including but not limited to headache/flushing, blue discoloration of vision, possible vascular steal and risk of cardiac effects if underlying unknown coronary artery disease, and permanent sensorineural hearing loss). Understanding expressed.

## 2022-11-12 LAB — COMPREHENSIVE METABOLIC PANEL
ALT: 17 U/L (ref 0–53)
AST: 15 U/L (ref 0–37)
Albumin: 4.4 g/dL (ref 3.5–5.2)
Alkaline Phosphatase: 45 U/L (ref 39–117)
BUN: 16 mg/dL (ref 6–23)
CO2: 29 mEq/L (ref 19–32)
Calcium: 9.3 mg/dL (ref 8.4–10.5)
Chloride: 104 mEq/L (ref 96–112)
Creatinine, Ser: 0.98 mg/dL (ref 0.40–1.50)
GFR: 87.79 mL/min (ref >60.00)
Glucose, Bld: 89 mg/dL (ref 70–99)
Potassium: 4.2 mEq/L (ref 3.5–5.1)
Sodium: 140 mEq/L (ref 135–145)
Total Bilirubin: 1 mg/dL (ref 0.2–1.2)
Total Protein: 7.1 g/dL (ref 6.0–8.3)

## 2022-11-12 LAB — LIPID PANEL
Cholesterol: 115 mg/dL (ref 0–200)
HDL: 35.3 mg/dL — ABNORMAL LOW (ref >39.00)
LDL Cholesterol: 60 mg/dL (ref 0–99)
NonHDL: 79.7
Total CHOL/HDL Ratio: 3
Triglycerides: 98 mg/dL (ref 0.0–149.0)
VLDL: 19.6 mg/dL (ref 0.0–40.0)

## 2022-11-12 LAB — HEMOGLOBIN A1C: Hgb A1c MFr Bld: 6.1 % (ref 4.6–6.5)

## 2022-11-18 ENCOUNTER — Telehealth: Payer: Self-pay

## 2022-11-18 NOTE — Telephone Encounter (Signed)
Pt needs PA for sildanifil, reported tried to pick up today and was denied due to this.   Please let us know when this is complete

## 2022-11-19 ENCOUNTER — Other Ambulatory Visit (HOSPITAL_COMMUNITY): Payer: Self-pay

## 2022-11-19 ENCOUNTER — Telehealth: Payer: Self-pay

## 2022-11-19 DIAGNOSIS — N529 Male erectile dysfunction, unspecified: Secondary | ICD-10-CM

## 2022-11-19 NOTE — Telephone Encounter (Signed)
Pharmacy Patient Advocate Encounter   Received notification from [facility] that prior authorization for Sildenafil Citrate 50MG  tablets is required/requested.   PA submitted on 11/19/22 to (ins) [employer] via [employer] or [facility]) confirmation # [id number] Status is pending

## 2022-11-19 NOTE — Telephone Encounter (Signed)
PA submitted. Created new encounter for PA. Will update and route back to pool once determination has been made.  

## 2022-11-22 ENCOUNTER — Other Ambulatory Visit (HOSPITAL_COMMUNITY): Payer: Self-pay

## 2022-11-22 NOTE — Telephone Encounter (Signed)
Pharmacy team states the pt insurance will only approve 6 tablets at a time . I called and left the pt a VM stating information

## 2022-11-22 NOTE — Telephone Encounter (Signed)
Pharmacy Patient Advocate Encounter  Received notification from [facility] [employer] that the request for prior authorization for Sildenafil Citrate 50MG  tablets  has been partially approved.       PA#  [id number] PRIOR AUTHORIZATION EXPIRES ON 11/18/25  Per [facility] test claim, copay for 6 tablets is $2.89

## 2022-11-23 MED ORDER — SILDENAFIL CITRATE 50 MG PO TABS
25.0000 mg | ORAL_TABLET | Freq: Every day | ORAL | 5 refills | Status: DC | PRN
Start: 2022-11-23 — End: 2023-05-19

## 2022-11-23 NOTE — Telephone Encounter (Signed)
Quantity changed to 6 tablets.

## 2022-11-23 NOTE — Addendum Note (Signed)
Addended by: Neva Seat, Remer Couse R on: 11/23/2022 10:00 AM   Modules accepted: Orders

## 2023-03-30 ENCOUNTER — Encounter: Payer: Self-pay | Admitting: Internal Medicine

## 2023-03-30 ENCOUNTER — Ambulatory Visit (INDEPENDENT_AMBULATORY_CARE_PROVIDER_SITE_OTHER): Payer: BC Managed Care – PPO | Admitting: Internal Medicine

## 2023-03-30 VITALS — BP 120/70 | HR 71 | Ht 76.0 in | Wt 237.0 lb

## 2023-03-30 DIAGNOSIS — E890 Postprocedural hypothyroidism: Secondary | ICD-10-CM

## 2023-03-30 DIAGNOSIS — Z85850 Personal history of malignant neoplasm of thyroid: Secondary | ICD-10-CM | POA: Diagnosis not present

## 2023-03-30 NOTE — Progress Notes (Unsigned)
Name: Tyrone Lee  MRN/ DOB: [id number], 23-Aug-1970    Age/ Sex: 52 y.o., male     PCP: Shade Flood, MD   Reason for Endocrinology Evaluation: PTC/Hypothyroidism     Initial Endocrinology Clinic Visit: 07/27/2018    PATIENT IDENTIFIER: Tyrone Lee is a 52 y.o., male with a past medical history of hx of PTC. He has followed with [facility] since 07/27/2018 for consultative assistance with management of his Hx of PTC and postoperative Hypothyroidism.   HISTORICAL SUMMARY:  1/20: thyroidectomy and RND: multifocal PTC, largest focus is 3.2 cm (T2N1).   2/20: RAI, 127 mCi 2/20: post-therapy scan: small focus in the neck.   8/20 Tg undetectable (ab pos).   8/21 Tg undetectable (ab pos). 9/21 [place] no nodule or thyroid tissue 3/22 Tg undetectable (ab pos) 9/22 Tg undetectable (ab pos).  3/23 Tg undetectable (ab pos) 09/2022 Tg undetectable    Hx of Hyperparathyroidism, dx'ed 2019, parathyroid adenoma was removed during thyroid surgery and his calcium has normalized since    Patient was followed by Dr. Everardo All from 2020 until 09/2021    SUBJECTIVE:    Today (03/30/2023):  Tyrone Lee is here for a follow-up on postoperative hypothyroid and Hx of PTC.  Denies local neck swelling  Denies palpitations  Denies loose stools or diarrhea unless related to food  Denies tremors  Denies anxiety    Levothyroxine 150 mcg daily  Vitamin D 1000 iu daily   HISTORY:  Past Medical History:  Past Medical History:  Diagnosis Date   Acid reflux    on meds   Diabetes mellitus without complication (HCC)    on meds   Hemorrhoids    Hyperlipidemia    Hypertension    Past Surgical History:  Past Surgical History:  Procedure Laterality Date   RADICAL NECK DISSECTION Right 07/19/2018   Procedure: RIGHT NECK DISSECTION;  Surgeon: Drema Halon, MD;  Location: [facility] OR;  Service: ENT;  Laterality: Right;   THYROIDECTOMY Right 07/19/2018   Procedure:  TOTAL THYROIDECTOMY AND RIGHT NECK DISSECTION;  Surgeon: Drema Halon, MD;  Location: [facility] OR;  Service: ENT;  Laterality: Right;   Social History:  reports that he has never smoked. He has never used smokeless tobacco. He reports current alcohol use. He reports that he does not use drugs. Family History:  Family History  Problem Relation Age of Onset   Alcoholism Mother    Drug abuse Mother    Alcoholism Father    Lung cancer Maternal Grandfather    Lung cancer Paternal Grandfather    Diabetes Maternal Grandmother    Colon cancer Neg Hx    Stomach cancer Neg Hx    Esophageal cancer Neg Hx    Thyroid disease Neg Hx      HOME MEDICATIONS: Allergies as of 03/30/2023   No Known Allergies      Medication List        Accurate as of March 30, 2023  2:45 PM. If you have any questions, ask your nurse or doctor.          Accu-Chek Guide test strip Generic drug: glucose blood TEST UP TO FOUR TIMES A DAY AS DIRECTED (START WITH TESTING ONCE PER DAY)   atorvastatin 20 MG tablet Commonly known as: LIPITOR TAKE 1 TABLET BY MOUTH DAILY AT 6 PM.   blood glucose meter kit and supplies Dispense based on patient and insurance preference. Use up to four times daily as  directed. (FOR ICD-10 E10.9, E11.9). Once per day for now.   [facility] D3 50 MCG (2000 UT) Caps Generic drug: Cholecalciferol TAKE 1 CAPSULE BY MOUTH EVERY DAY   levothyroxine 150 MCG tablet Commonly known as: SYNTHROID Take 1 tablet (150 mcg total) by mouth daily.   lisinopril 10 MG tablet Commonly known as: ZESTRIL Take 1 tablet (10 mg total) by mouth daily.   metFORMIN 1000 MG tablet Commonly known as: GLUCOPHAGE TAKE 1 TABLET (1,000 MG TOTAL) BY MOUTH TWICE A DAY WITH FOOD   omeprazole 40 MG capsule Commonly known as: PRILOSEC TAKE ONE PILL ,40 MG, ONE HOUR BEFORE BREAKFAST. TAKE FOR AT LEAST 6 WEEKS TO PROMOTE ESOPHAGEAL HEALING.   sildenafil 50 MG tablet Commonly known as: Viagra Take 0.5-1  tablets (25-50 mg total) by mouth daily as needed for erectile dysfunction.   Trulicity 1.5 MG/0.5ML Sopn Generic drug: Dulaglutide Inject 1.5 mg into the skin once a week.          OBJECTIVE:   PHYSICAL EXAM: VS: BP 120/70 (BP Location: Left Arm, Patient Position: Sitting, Cuff Size: Large)   Pulse 71   Ht 6\' 4"  (1.93 m)   Wt 237 lb (107.5 kg)   SpO2 98%   BMI 28.85 kg/m    EXAM: General: Pt appears well and is in NAD  Neck: General: Supple without adenopathy. Thyroid:  No goiter or nodules appreciated.  Lungs: Clear with good BS bilat   Heart: Auscultation: RRR.  Abdomen: soft, nontender, without masses or organomegaly palpable  Extremities:  BL LE: No pretibial edema   Mental Status: Judgment, insight: Intact Orientation: Oriented to time, place, and person Mood and affect: No depression, anxiety, or agitation     DATA REVIEWED:   Latest Reference Range & Units 03/30/23 14:54  TSH 0.35 - 5.50 uIU/mL 0.10 (L)  (L): Data is abnormally low   Latest Reference Range & Units 09/24/22 15:00  Thyroglobulin by LCMS 1.4 - 29.2 ng/mL <0.2 (L)  (L): Data is abnormally low   Thyroid ultrasound 09/24/2022  FINDINGS: Isthmus: Surgically absent. There is no residual nodular soft tissue within the isthmic resection bed.   Right lobe: Surgically absent. There is no residual nodular soft tissue within the right lobectomy resection bed.   Left lobe: Surgically absent. There is no residual nodular soft tissue within the left lobectomy resection bed.   _________________________________________________________   No regional cervical lymphadenopathy.   IMPRESSION: Post total thyroidectomy without evidence of residual or locally recurrent disease.    Thyroid Pathology 07/19/2018   1. Thyroid, lobectomy, Right lobe - PAPILLARY CARCINOMA, MULTIFOCAL, LARGEST TUMOR IS 3.2 CM. - EXTRATHYROIDAL EXTENSION IS PRESENT. - MARGINS OF RESECTION ARE NOT INVOLVED. -  METASTATIC CARCINOMA PRESENT IN ONE OF ONE LYMPH NODE (1/1). - SEE ONCOLOGY TABLE. 2. Lymph node, biopsy, Inferior Right - METASTATIC CARCINOMA PRESENT IN ONE OF SEVEN LYMPH NODES (1/7). 3. Thyroid, lobectomy, Left and central neck - CHRONIC LYMPHOCYTIC THYROIDITIS. - NO CARCINOMA IDENTIFIED IN EIGHT LYMPH NODES (0/8). 4. Parathyroid gland, Questionable Right - HYPERCELLULAR PARATHYROID TISSUE, 1.5 GRAMS. 5. Lymph nodes, radical neck dissection - METASTATIC CARCINOMA PRESENT IN THREE OF FIFTEEN LYMPH NODES (3/15). - EXTRANODAL EXTENSION IS PRESENT. Microscopic Comment 1. THYROID GLAND: Procedure: Total thyroidectomy Tumor Focality: Multifocal Tumor Site: Right lobe Tumor Size: 3.2 cm Histologic Type: Papillary carcinoma, classic (usual, conventional) Margins: Uninvolved by carcinoma Distance of invasive carcinoma from closest margin: Less than 1 mm from the black ink Angioinvasion: Not identified Lymphatic Invasion: Present  Extrathyroidal Extension: Present, microscopic Regional Lymph Nodes:   Microscopic Comment(continued) Number of Lymph Nodes Involved: 5 Nodal Levels Involved: One lymph node at the superior aspect of the right thyroid lobectomy (specimen 1) One lymph node from the specimen submitted as inferior right (specimen 2) Three lymph nodes from the radical neck dissection (specimen 5) Size of Largest Metastatic Deposit: 3.5 cm Extranodal Extension (ENE): Present Number of Lymph Nodes Examined: 31 Nodal Levels Examined: One lymph node at the superior aspect of the right thyroid lobectomy (specimen 1) Seven lymph nodes from the specimen submitted as inferior right (specimen 2) Eight lymph nodes from the specimen submitted as left and central neck thyroidectomy (specimen 3) Fifteen lymph nodes from the radical neck dissection (specimen 5) Pathologic Stage Classification (pTNM, AJCC 8th Edition): mpT2, pN1  Old records , labs and images have been reviewed.    ASSESSMENT / PLAN / RECOMMENDATIONS:   Hx PTC:  -Patient was at high risk for recurrence based on metastasis to a lymph node of 3.5 cm in largest dimension on his initial pathology report -Historically he has had positive TG antibodies, with low thyroglobulin level -Repeat thyroid bed ultrasound 10/2022 , did not show any evidence of recurrence - TSH goal ~ 0.1 -TG AB remains elevated, TG pending today but historically thyroglobulin levels have been low   2.  Postoperative hypothyroid:  -Patient is clinically euthyroid -TSH at goal, no changes at this time, but if his TSH continues to trend down we will decrease levothyroxine dose  Medication Continue levothyroxine 150 mcg daily  Patient would like to go to yearly visits    Signed electronically by: Lyndle Herrlich, MD  [facility]  [facility] [address] [place] [place], [place] [address] Phone: [phone] FAX: [phone]      CC: Shade Flood, MD [address] [place] [address] Phone: [phone]  Fax: [phone]   Return to Endocrinology clinic as below: Future Appointments  Date Time Provider Department Center  05/19/2023  3:00 PM Shade Flood, MD [facility]

## 2023-03-30 NOTE — Patient Instructions (Signed)

## 2023-03-31 LAB — TSH: TSH: 0.1 u[IU]/mL — ABNORMAL LOW (ref 0.35–5.50)

## 2023-03-31 MED ORDER — LEVOTHYROXINE SODIUM 150 MCG PO TABS
150.0000 ug | ORAL_TABLET | Freq: Every day | ORAL | 3 refills | Status: DC
Start: 2023-03-31 — End: 2024-04-04

## 2023-04-20 LAB — THYROGLOBULIN BY LCMS: Thyroglobulin by LCMS: <0.2 ng/mL — ABNORMAL LOW (ref 1.4–29.2)

## 2023-04-20 LAB — TGAB+THYROGLOBULIN IMA OR LCMS: Thyroglobulin Antibody: 61 [IU]/mL — ABNORMAL HIGH (ref 0.0–0.9)

## 2023-05-01 ENCOUNTER — Other Ambulatory Visit: Payer: Self-pay | Admitting: Family Medicine

## 2023-05-01 DIAGNOSIS — E785 Hyperlipidemia, unspecified: Secondary | ICD-10-CM

## 2023-05-01 DIAGNOSIS — E1165 Type 2 diabetes mellitus with hyperglycemia: Secondary | ICD-10-CM

## 2023-05-19 ENCOUNTER — Ambulatory Visit (INDEPENDENT_AMBULATORY_CARE_PROVIDER_SITE_OTHER): Payer: BC Managed Care – PPO | Admitting: Family Medicine

## 2023-05-19 VITALS — BP 130/78 | HR 66 | Temp 98.1°F

## 2023-05-19 DIAGNOSIS — I10 Essential (primary) hypertension: Secondary | ICD-10-CM

## 2023-05-19 DIAGNOSIS — Z7984 Long term (current) use of oral hypoglycemic drugs: Secondary | ICD-10-CM

## 2023-05-19 DIAGNOSIS — N529 Male erectile dysfunction, unspecified: Secondary | ICD-10-CM

## 2023-05-19 DIAGNOSIS — Z7985 Long-term (current) use of injectable non-insulin antidiabetic drugs: Secondary | ICD-10-CM

## 2023-05-19 DIAGNOSIS — E785 Hyperlipidemia, unspecified: Secondary | ICD-10-CM

## 2023-05-19 DIAGNOSIS — E1165 Type 2 diabetes mellitus with hyperglycemia: Secondary | ICD-10-CM

## 2023-05-19 DIAGNOSIS — R7989 Other specified abnormal findings of blood chemistry: Secondary | ICD-10-CM

## 2023-05-19 MED ORDER — LISINOPRIL 10 MG PO TABS: 10.0000 mg | ORAL_TABLET | Freq: Every day | ORAL | 1 refills | Status: DC

## 2023-05-19 MED ORDER — TRULICITY 1.5 MG/0.5ML ~~LOC~~ SOAJ: 1.5000 mg | SUBCUTANEOUS | 1 refills | Status: DC

## 2023-05-19 MED ORDER — METFORMIN HCL 1000 MG PO TABS: 1000.0000 mg | ORAL_TABLET | Freq: Two times a day (BID) | ORAL | 1 refills | Status: DC

## 2023-05-19 MED ORDER — ATORVASTATIN CALCIUM 20 MG PO TABS: ORAL_TABLET | ORAL | 1 refills | Status: DC

## 2023-05-19 MED ORDER — SILDENAFIL CITRATE 100 MG PO TABS: 50.0000 mg | ORAL_TABLET | Freq: Every day | ORAL | 11 refills | Status: DC | PRN

## 2023-05-19 NOTE — Progress Notes (Signed)
Subjective:  Patient ID: Tyrone Lee, male    DOB: 26-Dec-1970  Age: 52 y.o. MRN: [id number]  CC:  Chief Complaint  Patient presents with   Medical Management of Chronic Issues    Pt is well and notes no concerns or questions     HPI Tyrone Lee presents for   Followed by Dr .Lonzo Cloud for hypothyroidism, PTC. Appt on 03/30/23. Cliniclaly euthyroid, continued on levothyroxine. Monitoring thyroglobulin level with hx of PTC. Thyroid ultrasound 10/2022 without sign of recurrence.  Yearly visits.   Diabetes: Treated with metformin, Trulicity, ACE inhibitor and statin.  Improved control last visit. No n/v/abd pain or new side effects. No home readings.  No sx's of low blood sugars.   Microalbumin: 05/13/2022 Optho, foot exam, pneumovax:  Has declined ophthalmology exam, hepatitis C and HIV screening.  Lab Results  Component Value Date   HGBA1C 6.1 11/11/2022   HGBA1C 7.6 (H) 05/13/2022   HGBA1C 6.9 (H) 11/11/2021   Lab Results  Component Value Date   MICROALBUR 1.4 05/13/2022   LDLCALC 60 11/11/2022   CREATININE 0.98 11/11/2022   Hypertension: Lisinopril 10mg  every day no new cough or side effects.  Home readings:none BP Readings from Last 3 Encounters:  05/19/23 130/78  03/30/23 120/70  11/11/22 130/86   Lab Results  Component Value Date   CREATININE 0.98 11/11/2022    Prilosec as needed for heartburn - few times per week - effective, aware of trigger foods.   Hyperlipidemia: Lipitor 20 mg daily without new myalgias or side effects. Lab Results  Component Value Date   CHOL 115 11/11/2022   HDL 35.30 (L) 11/11/2022   LDLCALC 60 11/11/2022   TRIG 98.0 11/11/2022   CHOLHDL 3 11/11/2022   Lab Results  Component Value Date   ALT 17 11/11/2022   AST 15 11/11/2022   ALKPHOS 45 11/11/2022   BILITOT 1.0 11/11/2022   Erectile dysfunction Discussed at his May visit started on viagra, with lowest effective dose.  Denies any headache, flushing,  vision or hearing changes or chest pain/shortness of breath with exertion. Effective with 50mg  dose - some improvement, still some difficulty.  Exercising without muscle wasting. No fatigue.  No results found for: "TESTOSTERONE"   History Patient Active Problem List   Diagnosis Date Noted   Type 2 diabetes mellitus with hyperglycemia, without long-term current use of insulin (HCC) 11/11/2022   Hyperlipidemia 11/11/2022   Essential hypertension 11/11/2022   Erectile dysfunction 11/11/2022   Hx of papillary thyroid carcinoma 09/24/2022   Hyperparathyroidism (HCC) 07/27/2018   Hypothyroidism 07/27/2018   Thyroid cancer (HCC) 07/19/2018   Past Medical History:  Diagnosis Date   Acid reflux    on meds   Diabetes mellitus without complication (HCC)    on meds   Hemorrhoids    Hyperlipidemia    Hypertension    Past Surgical History:  Procedure Laterality Date   RADICAL NECK DISSECTION Right 07/19/2018   Procedure: RIGHT NECK DISSECTION;  Surgeon: Drema Halon, MD;  Location: [facility] OR;  Service: ENT;  Laterality: Right;   THYROIDECTOMY Right 07/19/2018   Procedure: TOTAL THYROIDECTOMY AND RIGHT NECK DISSECTION;  Surgeon: Drema Halon, MD;  Location: [facility] OR;  Service: ENT;  Laterality: Right;   Not on File Prior to Admission medications   Medication Sig Start Date End Date Taking? Authorizing Provider  ACCU-CHEK GUIDE test strip TEST UP TO FOUR TIMES A DAY AS DIRECTED (START WITH TESTING ONCE PER DAY) 04/21/18  Yes  Shade Flood, MD  atorvastatin (LIPITOR) 20 MG tablet TAKE 1 TABLET BY MOUTH DAILY AT 6 PM. 05/02/23  Yes Shade Flood, MD  blood glucose meter kit and supplies Dispense based on patient and insurance preference. Use up to four times daily as directed. (FOR ICD-10 E10.9, E11.9). Once per day for now. 08/04/17  Yes Shade Flood, MD  [employer] D3 50 MCG (2000 UT) CAPS TAKE 1 CAPSULE BY MOUTH EVERY DAY 09/04/19  Yes Romero Belling, MD  Dulaglutide (TRULICITY)  1.5 MG/0.5ML SOPN Inject 1.5 mg into the skin once a week. 11/11/22  Yes Shade Flood, MD  levothyroxine (SYNTHROID) 150 MCG tablet Take 1 tablet (150 mcg total) by mouth daily. 03/31/23  Yes Shamleffer, Konrad Dolores, MD  lisinopril (ZESTRIL) 10 MG tablet Take 1 tablet (10 mg total) by mouth daily. 11/11/22  Yes Shade Flood, MD  metFORMIN (GLUCOPHAGE) 1000 MG tablet TAKE 1 TABLET (1,000 MG TOTAL) BY MOUTH TWICE A DAY WITH FOOD 05/02/23  Yes Shade Flood, MD  omeprazole (PRILOSEC) 40 MG capsule TAKE ONE PILL ,40 MG, ONE HOUR BEFORE BREAKFAST. TAKE FOR AT LEAST 6 WEEKS TO PROMOTE ESOPHAGEAL HEALING. 03/20/21  Yes Pyrtle, Carie Caddy, MD  sildenafil (VIAGRA) 50 MG tablet Take 0.5-1 tablets (25-50 mg total) by mouth daily as needed for erectile dysfunction. 11/23/22  Yes Shade Flood, MD   Social History   Socioeconomic History   Marital status: Married    Spouse name: Not on file   Number of children: 2   Years of education: Not on file   Highest education level: Some college, no degree  Occupational History   Not on file  Tobacco Use   Smoking status: Never   Smokeless tobacco: Never  Vaping Use   Vaping status: Never Used  Substance and Sexual Activity   Alcohol use: Yes    Comment: weekends, occasional   Drug use: No   Sexual activity: Yes  Other Topics Concern   Not on file  Social History Narrative   Not on file   Social Determinants of Health   Financial Resource Strain: Patient Declined (05/15/2023)   Overall Financial Resource Strain (CARDIA)    Difficulty of Paying Living Expenses: Patient declined  Food Insecurity: Patient Declined (05/15/2023)   Hunger Vital Sign    Worried About Running Out of Food in the Last Year: Patient declined    Ran Out of Food in the Last Year: Patient declined  Transportation Needs: Unknown (05/15/2023)   PRAPARE - Administrator, Civil Service (Medical): No    Lack of Transportation (Non-Medical): Patient declined   Physical Activity: Sufficiently Active (05/15/2023)   Exercise Vital Sign    Days of Exercise per Week: 5 days    Minutes of Exercise per Session: 60 min  Stress: No Stress Concern Present (05/15/2023)   [employer] of Occupational Health - Occupational Stress Questionnaire    Feeling of Stress : Not at all  Social Connections: Unknown (05/15/2023)   Social Connection and Isolation Panel [NHANES]    Frequency of Communication with Friends and Family: More than three times a week    Frequency of Social Gatherings with Friends and Family: Three times a week    Attends Religious Services: Patient declined    Active Member of Clubs or Organizations: No    Attends Banker Meetings: Not on file    Marital Status: Patient declined  Intimate Partner Violence: Not on file  Review of Systems  Constitutional:  Negative for fatigue and unexpected weight change.  Eyes:  Negative for visual disturbance.  Respiratory:  Negative for cough, chest tightness and shortness of breath.   Cardiovascular:  Negative for chest pain, palpitations and leg swelling.  Gastrointestinal:  Negative for abdominal pain and blood in stool.  Neurological:  Negative for dizziness, light-headedness and headaches.     Objective:   Vitals:   05/19/23 1507  BP: 130/78  Pulse: 66  Temp: 98.1 F (36.7 C)  TempSrc: Temporal     Physical Exam Vitals reviewed.  Constitutional:      Appearance: He is well-developed.  HENT:     Head: Normocephalic and atraumatic.  Neck:     Vascular: No carotid bruit or JVD.  Cardiovascular:     Rate and Rhythm: Normal rate and regular rhythm.     Heart sounds: Normal heart sounds. No murmur heard. Pulmonary:     Effort: Pulmonary effort is normal.     Breath sounds: Normal breath sounds. No rales.  Musculoskeletal:     Right lower leg: No edema.     Left lower leg: No edema.  Skin:    General: Skin is warm and dry.  Neurological:     Mental Status:  He is alert and oriented to person, place, and time.  Psychiatric:        Mood and Affect: Mood normal.        Assessment & Plan:  Tyrone Lee is a 52 y.o. male . Type 2 diabetes mellitus with hyperglycemia, without long-term current use of insulin (HCC) - Plan: Hemoglobin A1c, Comprehensive metabolic panel, Microalbumin / creatinine urine ratio, Dulaglutide (TRULICITY) 1.5 MG/0.5ML SOAJ, metFORMIN (GLUCOPHAGE) 1000 MG tablet  -Tolerating current med regimen, check labs and adjust plan accordingly.  Hyperlipidemia, unspecified hyperlipidemia type - Plan: Lipid panel, Comprehensive metabolic panel, atorvastatin (LIPITOR) 20 MG tablet  -Tolerating Lipitor, check labs, adjust plan accordingly, no change in meds at this time.  Essential hypertension - Plan: lisinopril (ZESTRIL) 10 MG tablet  -Stable, labs as above.  Continue same dose lisinopril.  Erectile dysfunction, unspecified erectile dysfunction type - Plan: Testosterone, sildenafil (VIAGRA) 100 MG tablet  -Trial of higher dose Viagra with potential side effects and risk discussed lowest effective dose.  Will check testosterone level but advised if low reading will need to be repeated at least twice between 8 and 10 AM for accurate result.  No orders of the defined types were placed in this encounter.  Patient Instructions  Thanks for coming in today.  I will check testosterone level but if it is low that will need to be repeated twice between 8 and 10 AM for accurate reading.  That can be done as a lab visit but I will let you know depending on your results from today.  You can try the higher dose of Viagra up to 100 mg but if side effects at that dose return to 50 mg.  If any other concerns on labs I will let you know.  No med changes for now.  Take care!    Signed,   Meredith Staggers, MD [facility] 05/19/23 3:29 PM

## 2023-05-19 NOTE — Patient Instructions (Signed)
Thanks for coming in today.  I will check testosterone level but if it is low that will need to be repeated twice between 8 and 10 AM for accurate reading.  That can be done as a lab visit but I will let you know depending on your results from today.  You can try the higher dose of Viagra up to 100 mg but if side effects at that dose return to 50 mg.  If any other concerns on labs I will let you know.  No med changes for now.  Take care!

## 2023-05-20 LAB — LIPID PANEL
Cholesterol: 139 mg/dL (ref 0–200)
HDL: 41 mg/dL (ref >39.00)
LDL Cholesterol: 79 mg/dL (ref 0–99)
NonHDL: 98.06
Total CHOL/HDL Ratio: 3
Triglycerides: 93 mg/dL (ref 0.0–149.0)
VLDL: 18.6 mg/dL (ref 0.0–40.0)

## 2023-05-20 LAB — COMPREHENSIVE METABOLIC PANEL
ALT: 13 U/L (ref 0–53)
AST: 14 U/L (ref 0–37)
Albumin: 4.7 g/dL (ref 3.5–5.2)
Alkaline Phosphatase: 55 U/L (ref 39–117)
BUN: 19 mg/dL (ref 6–23)
CO2: 28 meq/L (ref 19–32)
Calcium: 9.6 mg/dL (ref 8.4–10.5)
Chloride: 102 meq/L (ref 96–112)
Creatinine, Ser: 1.09 mg/dL (ref 0.40–1.50)
GFR: 76.99 mL/min (ref >60.00)
Glucose, Bld: 80 mg/dL (ref 70–99)
Potassium: 4.2 meq/L (ref 3.5–5.1)
Sodium: 138 meq/L (ref 135–145)
Total Bilirubin: 1.1 mg/dL (ref 0.2–1.2)
Total Protein: 7.5 g/dL (ref 6.0–8.3)

## 2023-05-20 LAB — HEMOGLOBIN A1C: Hgb A1c MFr Bld: 6 % (ref 4.6–6.5)

## 2023-05-20 LAB — MICROALBUMIN / CREATININE URINE RATIO
Creatinine,U: 123 mg/dL
Microalb Creat Ratio: 0.6 mg/g (ref 0.0–30.0)
Microalb, Ur: 0.8 mg/dL (ref 0.0–1.9)

## 2023-05-20 LAB — TESTOSTERONE: Testosterone: 235.65 ng/dL — ABNORMAL LOW (ref 300.00–890.00)

## 2023-05-22 ENCOUNTER — Encounter: Payer: Self-pay | Admitting: Family Medicine

## 2023-05-25 NOTE — Addendum Note (Signed)
Addended by: Eldred Manges on: 05/25/2023 03:10 PM   Modules accepted: Orders

## 2023-05-27 ENCOUNTER — Encounter: Payer: Self-pay | Admitting: Family Medicine

## 2023-05-27 ENCOUNTER — Other Ambulatory Visit (INDEPENDENT_AMBULATORY_CARE_PROVIDER_SITE_OTHER): Payer: BC Managed Care – PPO

## 2023-05-27 DIAGNOSIS — R7989 Other specified abnormal findings of blood chemistry: Secondary | ICD-10-CM

## 2023-05-27 DIAGNOSIS — E349 Endocrine disorder, unspecified: Secondary | ICD-10-CM

## 2023-05-27 LAB — TESTOSTERONE: Testosterone: 289.59 ng/dL — ABNORMAL LOW (ref 300.00–890.00)

## 2023-05-27 NOTE — Telephone Encounter (Signed)
Pt is asking about lab notes he would rather not repeat test again due to cost

## 2023-05-27 NOTE — Telephone Encounter (Signed)
Replied on lab results note.

## 2023-05-30 ENCOUNTER — Other Ambulatory Visit: Payer: BC Managed Care – PPO

## 2023-06-13 NOTE — Telephone Encounter (Signed)
Pt reports he has not heard about his testosterone and next steps since we sent results to Dr Virgilio Frees should I call his office and put in a phone message on behalf of the patient?

## 2023-06-15 ENCOUNTER — Ambulatory Visit (INDEPENDENT_AMBULATORY_CARE_PROVIDER_SITE_OTHER): Payer: BC Managed Care – PPO | Admitting: Internal Medicine

## 2023-06-15 ENCOUNTER — Encounter: Payer: Self-pay | Admitting: Internal Medicine

## 2023-06-15 VITALS — BP 130/80 | HR 85 | Ht 76.0 in | Wt 238.0 lb

## 2023-06-15 DIAGNOSIS — R7989 Other specified abnormal findings of blood chemistry: Secondary | ICD-10-CM

## 2023-06-15 NOTE — Patient Instructions (Signed)
Clomiphene vs Testosterone

## 2023-06-15 NOTE — Progress Notes (Signed)
Name: Tyrone Lee [facility]  MRN/ DOB: [id number], January 09, 1971    Age/ Sex: 52 y.o., male     PCP: Shade Flood, MD   Reason for Endocrinology Evaluation: PTC/Hypothyroidism     Initial Endocrinology Clinic Visit: 07/27/2018    PATIENT IDENTIFIER: [name] is a 52 y.o., male with a past medical history of hx of PTC. He has followed with [facility] since 07/27/2018 for consultative assistance with management of his Hx of PTC and postoperative Hypothyroidism.   HISTORICAL SUMMARY:  1/20: thyroidectomy and RND: multifocal PTC, largest focus is 3.2 cm (T2N1).   2/20: RAI, 127 mCi 2/20: post-therapy scan: small focus in the neck.   8/20 Tg undetectable (ab pos).   8/21 Tg undetectable (ab pos). 9/21 [place] no nodule or thyroid tissue 3/22 Tg undetectable (ab pos) 9/22 Tg undetectable (ab pos).  3/23 Tg undetectable (ab pos) 09/2022 Tg undetectable    Hx of Hyperparathyroidism, dx'ed 2019, parathyroid adenoma was removed during thyroid surgery and his calcium has normalized since    Patient was followed by Dr. Everardo All from 2020 until 09/2021    SUBJECTIVE:    Today (06/15/2023):  Mr. [name] is here for a follow-up on postoperative hypothyroid and Hx of PTC.   Since his last visit he was evaluated by his PCP for rectal dysfunction, on Viagra.  Was noted with a low testosterone of 235 NG/DL, which was drawn at 8:65 PM  Repeat testosterone levels a week later around 10 AM continue to show low testosterone at 289.59 NG/DL  He has been having ED for a while but noticed it more recently.  Minimal spontaneous erections  Denies headaches  Denies vision changes  Denies nipple discharge  No change in testicular size  NO change in hair growth  Denies palpitations  Denies LE edema  Nocturia +  No tobacco use  No prostate cancer  No CAD/CVA  No Fh of clotting disorder   No narcotic  No illicit drug use or cannabis     Levothyroxine 150 mcg  daily  Vitamin D 1000 iu daily   HISTORY:  Past Medical History:  Past Medical History:  Diagnosis Date   Acid reflux    on meds   Diabetes mellitus without complication (HCC)    on meds   Hemorrhoids    Hyperlipidemia    Hypertension    Past Surgical History:  Past Surgical History:  Procedure Laterality Date   RADICAL NECK DISSECTION Right 07/19/2018   Procedure: RIGHT NECK DISSECTION;  Surgeon: Drema Halon, MD;  Location: [facility] OR;  Service: ENT;  Laterality: Right;   THYROIDECTOMY Right 07/19/2018   Procedure: TOTAL THYROIDECTOMY AND RIGHT NECK DISSECTION;  Surgeon: Drema Halon, MD;  Location: [facility] OR;  Service: ENT;  Laterality: Right;   Social History:  reports that he has never smoked. He has never used smokeless tobacco. He reports current alcohol use. He reports that he does not use drugs. Family History:  Family History  Problem Relation Age of Onset   Alcoholism Mother    Drug abuse Mother    Alcoholism Father    Lung cancer Maternal Grandfather    Lung cancer Paternal Grandfather    Diabetes Maternal Grandmother    Colon cancer Neg Hx    Stomach cancer Neg Hx    Esophageal cancer Neg Hx    Thyroid disease Neg Hx      HOME MEDICATIONS: Allergies as of 06/15/2023   Not on  File      Medication List        Accurate as of June 15, 2023  1:32 PM. If you have any questions, ask your nurse or doctor.          Accu-Chek Guide test strip Generic drug: glucose blood TEST UP TO FOUR TIMES A DAY AS DIRECTED (START WITH TESTING ONCE PER DAY)   atorvastatin 20 MG tablet Commonly known as: LIPITOR TAKE 1 TABLET BY MOUTH DAILY AT 6 PM.   blood glucose meter kit and supplies Dispense based on patient and insurance preference. Use up to four times daily as directed. (FOR ICD-10 E10.9, E11.9). Once per day for now.   CVS D3 50 MCG (2000 UT) Caps Generic drug: Cholecalciferol TAKE 1 CAPSULE BY MOUTH EVERY DAY   levothyroxine 150 MCG  tablet Commonly known as: SYNTHROID Take 1 tablet (150 mcg total) by mouth daily.   lisinopril 10 MG tablet Commonly known as: ZESTRIL Take 1 tablet (10 mg total) by mouth daily.   metFORMIN 1000 MG tablet Commonly known as: GLUCOPHAGE Take 1 tablet (1,000 mg total) by mouth 2 (two) times daily with a meal.   omeprazole 40 MG capsule Commonly known as: PRILOSEC TAKE ONE PILL ,40 MG, ONE HOUR BEFORE BREAKFAST. TAKE FOR AT LEAST 6 WEEKS TO PROMOTE ESOPHAGEAL HEALING.   sildenafil 100 MG tablet Commonly known as: Viagra Take 0.5-1 tablets (50-100 mg total) by mouth daily as needed for erectile dysfunction.   Trulicity 1.5 MG/0.5ML Soaj Generic drug: Dulaglutide Inject 1.5 mg into the skin once a week.          OBJECTIVE:   PHYSICAL EXAM: VS: BP 130/80 (BP Location: Left Arm, Patient Position: Sitting, Cuff Size: Large)   Pulse 85   Ht 6\' 4"  (1.93 m)   Wt 238 lb (108 kg)   SpO2 96%   BMI 28.97 kg/m    EXAM: General: Pt appears well and is in NAD  Neck: General: Supple without adenopathy. Thyroid:  No goiter or nodules appreciated.  Lungs: Clear with good BS bilat   Heart: Auscultation: RRR.  Abdomen: soft, nontender  Genital: Normal external genital exam, bilateral testicular volume 25 mL  Extremities:  BL LE: No pretibial edema   Mental Status: Judgment, insight: Intact Orientation: Oriented to time, place, and person Mood and affect: No depression, anxiety, or agitation     DATA REVIEWED:   Latest Reference Range & Units 05/19/23 15:49 05/27/23 09:58  Testosterone 300.00 - 890.00 ng/dL 742.59 (L) 563.87 (L)      Latest Reference Range & Units 03/30/23 14:54  TSH 0.35 - 5.50 uIU/mL 0.10 (L)    Latest Reference Range & Units 09/24/22 15:00  Thyroglobulin by LCMS 1.4 - 29.2 ng/mL <0.2 (L)     Thyroid ultrasound 09/24/2022  FINDINGS: Isthmus: Surgically absent. There is no residual nodular soft tissue within the isthmic resection bed.   Right  lobe: Surgically absent. There is no residual nodular soft tissue within the right lobectomy resection bed.   Left lobe: Surgically absent. There is no residual nodular soft tissue within the left lobectomy resection bed.   _________________________________________________________   No regional cervical lymphadenopathy.   IMPRESSION: Post total thyroidectomy without evidence of residual or locally recurrent disease.    Thyroid Pathology 07/19/2018   1. Thyroid, lobectomy, Right lobe - PAPILLARY CARCINOMA, MULTIFOCAL, LARGEST TUMOR IS 3.2 CM. - EXTRATHYROIDAL EXTENSION IS PRESENT. - MARGINS OF RESECTION ARE NOT INVOLVED. - METASTATIC CARCINOMA PRESENT IN ONE  OF ONE LYMPH NODE (1/1). - SEE ONCOLOGY TABLE. 2. Lymph node, biopsy, Inferior Right - METASTATIC CARCINOMA PRESENT IN ONE OF SEVEN LYMPH NODES (1/7). 3. Thyroid, lobectomy, Left and central neck - CHRONIC LYMPHOCYTIC THYROIDITIS. - NO CARCINOMA IDENTIFIED IN EIGHT LYMPH NODES (0/8). 4. Parathyroid gland, Questionable Right - HYPERCELLULAR PARATHYROID TISSUE, 1.5 GRAMS. 5. Lymph nodes, radical neck dissection - METASTATIC CARCINOMA PRESENT IN THREE OF FIFTEEN LYMPH NODES (3/15). - EXTRANODAL EXTENSION IS PRESENT. Microscopic Comment 1. THYROID GLAND: Procedure: Total thyroidectomy Tumor Focality: Multifocal Tumor Site: Right lobe Tumor Size: 3.2 cm Histologic Type: Papillary carcinoma, classic (usual, conventional) Margins: Uninvolved by carcinoma Distance of invasive carcinoma from closest margin: Less than 1 mm from the black ink Angioinvasion: Not identified Lymphatic Invasion: Present Extrathyroidal Extension: Present, microscopic Regional Lymph Nodes:   Microscopic Comment(continued) Number of Lymph Nodes Involved: 5 Nodal Levels Involved: One lymph node at the superior aspect of the right thyroid lobectomy (specimen 1) One lymph node from the specimen submitted as inferior right (specimen 2) Three lymph  nodes from the radical neck dissection (specimen 5) Size of Largest Metastatic Deposit: 3.5 cm Extranodal Extension (ENE): Present Number of Lymph Nodes Examined: 31 Nodal Levels Examined: One lymph node at the superior aspect of the right thyroid lobectomy (specimen 1) Seven lymph nodes from the specimen submitted as inferior right (specimen 2) Eight lymph nodes from the specimen submitted as left and central neck thyroidectomy (specimen 3) Fifteen lymph nodes from the radical neck dissection (specimen 5) Pathologic Stage Classification (pTNM, AJCC 8th Edition): mpT2, pN1  Old records , labs and images have been reviewed.   ASSESSMENT / PLAN / RECOMMENDATIONS:   Hx PTC:  -Patient was at high risk for recurrence based on metastasis to a lymph node of 3.5 cm in largest dimension on his initial pathology report -Historically he has had positive TG antibodies, with low thyroglobulin level -Repeat thyroid bed ultrasound 10/2022 , did not show any evidence of recurrence - TSH goal ~ 0.1 -TG AB remains elevated, TG has been low <0.2 ng/mL   2.  Postoperative hypothyroid:  -Patient is clinically euthyroid -TSH at goal  Medication Continue levothyroxine 150 mcg daily  3. Low Testosterone :  -We discussed the importance of repeating testosterone in the fasting early morning status, will include prolactin, LH -We discussed alternative treatment to include testosterone replacement therapy versus clomiphene depending on his test results -We discussed the risk and the benefit -We discussed side effects of testosterone therapy such as erythropoiesis, worsening of sleep apnea that is severe and untreated,  Prostate volumes and serum PSA increase in response to testosterone treatment which might increase BPH and worsen urinary outflow obstruction as well as prostate cancer risk.  - We also discussed there is a possibility of increased cardiovascular risk associated with testosterone  use. -Patient will return for fasting labs   Follow-up in 4 months   I spent 30 minutes preparing to see the patient by review of recent labs, imaging and procedures, obtaining and reviewing separately obtained history, communicating with the patient, ordering medications, tests or procedures, and documenting clinical information in the EHR including the differential Dx, treatment, and any further evaluation and other management   Signed electronically by: Lyndle Herrlich, MD  [facility]  [facility] [address] [place] [place], [place] [address] Phone: [phone] FAX: [phone]      CC: Shade Flood, MD [address] [place] [address] Phone: [phone]  Fax: [phone]   Return to Endocrinology clinic as below: Future Appointments  Date Time Provider Department Center  06/15/2023  1:40 PM Ireoluwa Gorsline, Konrad Dolores, MD [facility] None  11/16/2023  3:00 PM Shade Flood, MD [facility]  04/02/2024  3:00 PM Lynnex Fulp, Konrad Dolores, MD [facility] None

## 2023-06-23 ENCOUNTER — Other Ambulatory Visit: Payer: BC Managed Care – PPO

## 2023-06-23 DIAGNOSIS — R7989 Other specified abnormal findings of blood chemistry: Secondary | ICD-10-CM | POA: Diagnosis not present

## 2023-06-27 LAB — TESTOSTERONE, TOTAL, LC/MS/MS: Testosterone, Total, LC-MS-MS: 306 ng/dL (ref 250–1100)

## 2023-06-27 LAB — LUTEINIZING HORMONE: LH: 2.5 m[IU]/mL (ref 1.5–9.3)

## 2023-06-27 LAB — PROLACTIN: Prolactin: 5 ng/mL (ref 2.0–18.0)

## 2023-06-27 LAB — PSA: PSA: 0.5 ng/mL (ref <=4.00)

## 2023-10-14 ENCOUNTER — Ambulatory Visit: Payer: BC Managed Care – PPO | Admitting: Internal Medicine

## 2023-11-16 ENCOUNTER — Ambulatory Visit: Payer: BC Managed Care – PPO | Admitting: Family Medicine

## 2023-11-16 DIAGNOSIS — E785 Hyperlipidemia, unspecified: Secondary | ICD-10-CM | POA: Diagnosis not present

## 2023-11-16 DIAGNOSIS — E1165 Type 2 diabetes mellitus with hyperglycemia: Secondary | ICD-10-CM | POA: Diagnosis not present

## 2023-11-16 DIAGNOSIS — Z7984 Long term (current) use of oral hypoglycemic drugs: Secondary | ICD-10-CM

## 2023-11-16 DIAGNOSIS — Z7985 Long-term (current) use of injectable non-insulin antidiabetic drugs: Secondary | ICD-10-CM

## 2023-11-16 DIAGNOSIS — I10 Essential (primary) hypertension: Secondary | ICD-10-CM | POA: Diagnosis not present

## 2023-11-16 DIAGNOSIS — Z0000 Encounter for general adult medical examination without abnormal findings: Secondary | ICD-10-CM

## 2023-11-16 MED ORDER — ATORVASTATIN CALCIUM 20 MG PO TABS: ORAL_TABLET | ORAL | 1 refills | Status: DC

## 2023-11-16 MED ORDER — TRULICITY 1.5 MG/0.5ML ~~LOC~~ SOAJ: 1.5000 mg | SUBCUTANEOUS | 1 refills | Status: DC

## 2023-11-16 MED ORDER — LISINOPRIL 10 MG PO TABS: 10.0000 mg | ORAL_TABLET | Freq: Every day | ORAL | 1 refills | Status: DC

## 2023-11-16 MED ORDER — METFORMIN HCL 1000 MG PO TABS: 1000.0000 mg | ORAL_TABLET | Freq: Two times a day (BID) | ORAL | 1 refills | Status: DC

## 2023-11-16 NOTE — Progress Notes (Signed)
 Subjective:  Patient ID: Tyrone Lee, male    DOB: 10/24/1970  Age: 53 y.o. MRN: [id number]  CC:  Chief Complaint  Patient presents with   Annual Exam    Pt is doing well fasting x 4 hours, pt notes no questions     HPI Tyrone Lee presents for Annual Exam PCP, MD Endocrinology, Dr. Rosalea Collin, history of papillary thyroid  cancer, postoperative hypothyroidism.  Low testosterone .  Office visit in December.  Repeat thyroid  bed ultrasound in April 2024 without evidence of recurrence.   Clinically euthyroid.  Continued levothyroxine  150 mcg daily.  Labs ordered for testosterone  with follow-up planned in 4 months.  Testosterone  level of 306, normal range 06/23/23, normal prolactin,Normal LH,  normal PSA  Diabetes: Treated with metformin , Trulicity , ACE inhibitor, statin. Denies nausea, vomiting and abdominal pain,  new neck swelling or side effects of meds.  He is on ACE inhibitor and statin No home readings, but no symptoms of low blood sugars. Microalbumin: Normal ratio 05/19/2023 Optho, foot exam, pneumovax:  Ophthalmology exam: overdue, declines  Declines pneumonia vaccine Declines hep C and HIV screening Still watching diet. Exercising daily 30, weights, some cardio.  Lab Results  Component Value Date   HGBA1C 6.0 05/19/2023   HGBA1C 6.1 11/11/2022   HGBA1C 7.6 (H) 05/13/2022   Lab Results  Component Value Date   MICROALBUR 0.8 05/19/2023   LDLCALC 79 05/19/2023   CREATININE 1.09 05/19/2023   Hypertension: Lisinopril  10 mg daily without new cough or side effects. Home readings: none BP Readings from Last 3 Encounters:  11/16/23 126/78  06/15/23 130/80  05/19/23 130/78   Lab Results  Component Value Date   CREATININE 1.09 05/19/2023   Hyperlipidemia: Lipitor 20 mg daily without new myalgias/side effects.  Lab Results  Component Value Date   CHOL 139 05/19/2023   HDL 41.00 05/19/2023   LDLCALC 79 05/19/2023   TRIG 93.0 05/19/2023   CHOLHDL 3  05/19/2023   Lab Results  Component Value Date   ALT 13 05/19/2023   AST 14 05/19/2023   ALKPHOS 55 05/19/2023   BILITOT 1.1 05/19/2023    GERD Intermittent dosing of Prilosec, few times per week has been effective previously and aware of trigger foods.  Taking PPI as needed.      11/16/2023    3:09 PM 05/19/2023    3:10 PM 11/11/2022    2:45 PM 05/13/2022    2:08 PM 11/06/2020    3:29 PM  Depression screen PHQ 2/9  Decreased Interest 0 0 0 0 0  Down, Depressed, Hopeless 0 0 0 0 0  PHQ - 2 Score 0 0 0 0 0  Altered sleeping 0 0 0 0   Tired, decreased energy 0 0 0 0   Change in appetite 0 0 0 0   Feeling bad or failure about yourself  0 0 0 0   Trouble concentrating 0 0 0 0   Moving slowly or fidgety/restless 0 0 0 0   Suicidal thoughts 0 0 0 0   PHQ-9 Score 0 0 0 0   Difficult doing work/chores   Not difficult at all      Health Maintenance  Topic Date Due   HIV Screening  Never done   Hepatitis C Screening  Never done   Pneumococcal Vaccine 55-76 Years old (2 of 2 - PCV) 11/02/2018   OPHTHALMOLOGY EXAM  05/28/2021   HEMOGLOBIN A1C  11/16/2023   COVID-19 Vaccine (7 - 2024-25 season) 11/28/2023  INFLUENZA VACCINE  02/03/2024   Diabetic kidney evaluation - eGFR measurement  05/18/2024   Diabetic kidney evaluation - Urine ACR  05/18/2024   Colonoscopy  01/30/2028   DTaP/Tdap/Td (2 - Td or Tdap) 01/31/2029   Zoster Vaccines- Shingrix  Completed   HPV VACCINES  Aged Out   Meningococcal B Vaccine  Aged Out   FOOT EXAM  Discontinued  Colonoscopy 01/2021 - few polyps, tubular adenoma recall 68yrs.  Prostate: does not have family history of prostate cancer Lab Results  Component Value Date   PSA 0.50 06/23/2023    Immunization History  Administered Date(s) Administered   Janssen (J&J) SARS-COV-2 Vaccination 09/15/2019   Moderna Covid-19 Fall Seasonal Vaccine 26yrs & older 05/13/2022, 05/31/2023   PFIZER(Purple Top)SARS-COV-2 Vaccination 05/02/2020, 11/14/2020, 05/14/2021    Pneumococcal Polysaccharide-23 11/01/2017   Tdap 02/01/2019   Zoster Recombinant(Shingrix) 05/02/2020, 11/06/2020  Covid booster last November.  Declines pneumonia vaccine, flu vaccines.   Vision Screening   Right eye Left eye Both eyes  Without correction 20/40 20/30 20/30   With correction     Comments: Patient notes he does have glasses at home    Dental:Within Last 6 months - every 4 months.   Alcohol: none  Tobacco: none  Exercise: as above.   ED: No HA/flushing. No hearing/vision changes.   History Patient Active Problem List   Diagnosis Date Noted   Type 2 diabetes mellitus with hyperglycemia, without long-term current use of insulin (HCC) 11/11/2022   Hyperlipidemia 11/11/2022   Essential hypertension 11/11/2022   Erectile dysfunction 11/11/2022   Hx of papillary thyroid  carcinoma 09/24/2022   Hyperparathyroidism (HCC) 07/27/2018   Hypothyroidism 07/27/2018   Thyroid  cancer (HCC) 07/19/2018   Past Medical History:  Diagnosis Date   Acid reflux    on meds   Diabetes mellitus without complication (HCC)    on meds   Hemorrhoids    Hyperlipidemia    Hypertension    Past Surgical History:  Procedure Laterality Date   RADICAL NECK DISSECTION Right 07/19/2018   Procedure: RIGHT NECK DISSECTION;  Surgeon: Prescott Brodie, MD;  Location: [facility] OR;  Service: ENT;  Laterality: Right;   THYROIDECTOMY Right 07/19/2018   Procedure: TOTAL THYROIDECTOMY AND RIGHT NECK DISSECTION;  Surgeon: Prescott Brodie, MD;  Location: [facility] OR;  Service: ENT;  Laterality: Right;   No Known Allergies Prior to Admission medications   Medication Sig Start Date End Date Taking? Authorizing Provider  ACCU-CHEK GUIDE test strip TEST UP TO FOUR TIMES A DAY AS DIRECTED (START WITH TESTING ONCE PER DAY) 04/21/18  Yes Benjiman Bras, MD  atorvastatin  (LIPITOR) 20 MG tablet TAKE 1 TABLET BY MOUTH DAILY AT 6 PM. 05/19/23  Yes Benjiman Bras, MD  blood glucose meter kit and supplies  Dispense based on patient and insurance preference. Use up to four times daily as directed. (FOR ICD-10 E10.9, E11.9). Once per day for now. 08/04/17  Yes Benjiman Bras, MD  [employer] D3 50 MCG (2000 UT) CAPS TAKE 1 CAPSULE BY MOUTH EVERY DAY 09/04/19  Yes Gwyndolyn Lerner, MD  Dulaglutide  (TRULICITY ) 1.5 MG/0.5ML SOAJ Inject 1.5 mg into the skin once a week. 05/19/23  Yes Benjiman Bras, MD  levothyroxine  (SYNTHROID ) 150 MCG tablet Take 1 tablet (150 mcg total) by mouth daily. 03/31/23  Yes Shamleffer, Ibtehal Jaralla, MD  lisinopril  (ZESTRIL ) 10 MG tablet Take 1 tablet (10 mg total) by mouth daily. 05/19/23  Yes Benjiman Bras, MD  metFORMIN  (GLUCOPHAGE ) 1000 MG tablet Take  1 tablet (1,000 mg total) by mouth 2 (two) times daily with a meal. 05/19/23  Yes Benjiman Bras, MD  omeprazole  (PRILOSEC) 40 MG capsule TAKE ONE PILL ,40 MG, ONE HOUR BEFORE BREAKFAST. TAKE FOR AT LEAST 6 WEEKS TO PROMOTE ESOPHAGEAL HEALING. 03/20/21  Yes Pyrtle, Amber Bail, MD  sildenafil  (VIAGRA ) 100 MG tablet Take 0.5-1 tablets (50-100 mg total) by mouth daily as needed for erectile dysfunction. 05/19/23  Yes Benjiman Bras, MD   Social History   Socioeconomic History   Marital status: Married    Spouse name: Not on file   Number of children: 2   Years of education: Not on file   Highest education level: GED or equivalent  Occupational History   Not on file  Tobacco Use   Smoking status: Never   Smokeless tobacco: Never  Vaping Use   Vaping status: Never Used  Substance and Sexual Activity   Alcohol use: Not Currently    Comment: weekends, occasional   Drug use: No   Sexual activity: Yes  Other Topics Concern   Not on file  Social History Narrative   Not on file   Social Drivers of Health   Financial Resource Strain: Low Risk  (11/16/2023)   Overall Financial Resource Strain (CARDIA)    Difficulty of Paying Living Expenses: Not hard at all  Food Insecurity: No Food Insecurity (11/16/2023)   Hunger Vital  Sign    Worried About Running Out of Food in the Last Year: Never true    Ran Out of Food in the Last Year: Never true  Transportation Needs: No Transportation Needs (11/16/2023)   PRAPARE - Administrator, Civil Service (Medical): No    Lack of Transportation (Non-Medical): No  Physical Activity: Sufficiently Active (11/16/2023)   Exercise Vital Sign    Days of Exercise per Week: 5 days    Minutes of Exercise per Session: 30 min  Stress: No Stress Concern Present (11/16/2023)   [employer] of Occupational Health - Occupational Stress Questionnaire    Feeling of Stress : Not at all  Social Connections: Socially Isolated (11/16/2023)   Social Connection and Isolation Panel [NHANES]    Frequency of Communication with Friends and Family: Twice a week    Frequency of Social Gatherings with Friends and Family: Once a week    Attends Religious Services: Never    Database administrator or Organizations: No    Attends Engineer, structural: Not on file    Marital Status: Separated  Intimate Partner Violence: Not on file    Review of Systems 13 point review of systems per patient health survey noted.  Negative other than as indicated above or in HPI.   Objective:   Vitals:   11/16/23 1504  BP: 126/78  Pulse: 65  Temp: 98.5 F (36.9 C)  TempSrc: Temporal  SpO2: 97%  Weight: 244 lb 6.4 oz (110.9 kg)  Height: 6' 3.25" (1.911 m)     Physical Exam Vitals reviewed.  Constitutional:      Appearance: He is well-developed.  HENT:     Head: Normocephalic and atraumatic.     Right Ear: External ear normal.     Left Ear: External ear normal.  Eyes:     Conjunctiva/sclera: Conjunctivae normal.     Pupils: Pupils are equal, round, and reactive to light.  Neck:     Thyroid : No thyromegaly.  Cardiovascular:     Rate and Rhythm: Normal rate and  regular rhythm.     Heart sounds: Normal heart sounds.  Pulmonary:     Effort: Pulmonary effort is normal. No  respiratory distress.     Breath sounds: Normal breath sounds. No wheezing.  Abdominal:     General: There is no distension.     Palpations: Abdomen is soft.     Tenderness: There is no abdominal tenderness.  Musculoskeletal:        General: No tenderness. Normal range of motion.     Cervical back: Normal range of motion and neck supple.  Lymphadenopathy:     Cervical: No cervical adenopathy.  Skin:    General: Skin is warm and dry.  Neurological:     Mental Status: He is alert and oriented to person, place, and time.     Deep Tendon Reflexes: Reflexes are normal and symmetric.  Psychiatric:        Behavior: Behavior normal.       Assessment & Plan:  Tyrone Lee is a 53 y.o. male . Type 2 diabetes mellitus with hyperglycemia, without long-term current use of insulin (HCC) - Plan: Microalbumin / creatinine urine ratio, metFORMIN  (GLUCOPHAGE ) 1000 MG tablet, Dulaglutide  (TRULICITY ) 1.5 MG/0.5ML SOAJ, Hemoglobin A1c  - Tolerating current med regimen, no changes for now, check labs as above and adjust plan accordingly.  Essential hypertension - Plan: lisinopril  (ZESTRIL ) 10 MG tablet, Comprehensive metabolic panel with GFR  - Check labs, stable, continue lisinopril  same dose for now.  Hyperlipidemia, unspecified hyperlipidemia type - Plan: atorvastatin  (LIPITOR) 20 MG tablet, Lipid panel  - Tolerating Lipitor, continue same, labs as above and adjust plan accordingly.  Meds ordered this encounter  Medications   metFORMIN  (GLUCOPHAGE ) 1000 MG tablet    Sig: Take 1 tablet (1,000 mg total) by mouth 2 (two) times daily with a meal.    Dispense:  180 tablet    Refill:  1   lisinopril  (ZESTRIL ) 10 MG tablet    Sig: Take 1 tablet (10 mg total) by mouth daily.    Dispense:  90 tablet    Refill:  1    DX Code Needed  .   Dulaglutide  (TRULICITY ) 1.5 MG/0.5ML SOAJ    Sig: Inject 1.5 mg into the skin once a week.    Dispense:  6 mL    Refill:  1   atorvastatin  (LIPITOR) 20 MG  tablet    Sig: TAKE 1 TABLET BY MOUTH DAILY AT 6 PM.    Dispense:  90 tablet    Refill:  1   Patient Instructions  No medication changes at this time.  If any concerns on labs I will let you know.  Keep follow-up with your endocrinologist as planned and let me know if there are any questions from today's visit.  Take care!  Preventive Care 52-61 Years Old, Male Preventive care refers to lifestyle choices and visits with your health care provider that can promote health and wellness. Preventive care visits are also called wellness exams. What can I expect for my preventive care visit? Counseling During your preventive care visit, your health care provider may ask about your: Medical history, including: Past medical problems. Family medical history. Current health, including: Emotional well-being. Home life and relationship well-being. Sexual activity. Lifestyle, including: Alcohol, nicotine or tobacco, and drug use. Access to firearms. Diet, exercise, and sleep habits. Safety issues such as seatbelt and bike helmet use. Sunscreen use. Work and work Astronomer. Physical exam Your health care provider will check your: Height and  weight. These may be used to calculate your BMI (body mass index). BMI is a measurement that tells if you are at a healthy weight. Waist circumference. This measures the distance around your waistline. This measurement also tells if you are at a healthy weight and may help predict your risk of certain diseases, such as type 2 diabetes and high blood pressure. Heart rate and blood pressure. Body temperature. Skin for abnormal spots. What immunizations do I need?  Vaccines are usually given at various ages, according to a schedule. Your health care provider will recommend vaccines for you based on your age, medical history, and lifestyle or other factors, such as travel or where you work. What tests do I need? Screening Your health care provider may  recommend screening tests for certain conditions. This may include: Lipid and cholesterol levels. Diabetes screening. This is done by checking your blood sugar (glucose) after you have not eaten for a while (fasting). Hepatitis B test. Hepatitis C test. HIV (human immunodeficiency virus) test. STI (sexually transmitted infection) testing, if you are at risk. Lung cancer screening. Prostate cancer screening. Colorectal cancer screening. Talk with your health care provider about your test results, treatment options, and if necessary, the need for more tests. Follow these instructions at home: Eating and drinking  Eat a diet that includes fresh fruits and vegetables, whole grains, lean protein, and low-fat dairy products. Take vitamin and mineral supplements as recommended by your health care provider. Do not drink alcohol if your health care provider tells you not to drink. If you drink alcohol: Limit how much you have to 0-2 drinks a day. Know how much alcohol is in your drink. In the [place] one drink equals one 12 oz bottle of beer (355 mL), one 5 oz glass of wine (148 mL), or one 1 oz glass of hard liquor (44 mL). Lifestyle Brush your teeth every morning and night with fluoride toothpaste. Floss one time each day. Exercise for at least 30 minutes 5 or more days each week. Do not use any products that contain nicotine or tobacco. These products include cigarettes, chewing tobacco, and vaping devices, such as e-cigarettes. If you need help quitting, ask your health care provider. Do not use drugs. If you are sexually active, practice safe sex. Use a condom or other form of protection to prevent STIs. Take aspirin only as told by your health care provider. Make sure that you understand how much to take and what form to take. Work with your health care provider to find out whether it is safe and beneficial for you to take aspirin daily. Find healthy ways to manage stress, such  as: Meditation, yoga, or listening to music. Journaling. Talking to a trusted person. Spending time with friends and family. Minimize exposure to UV radiation to reduce your risk of skin cancer. Safety Always wear your seat belt while driving or riding in a vehicle. Do not drive: If you have been drinking alcohol. Do not ride with someone who has been drinking. When you are tired or distracted. While texting. If you have been using any mind-altering substances or drugs. Wear a helmet and other protective equipment during sports activities. If you have firearms in your house, make sure you follow all gun safety procedures. What's next? Go to your health care provider once a year for an annual wellness visit. Ask your health care provider how often you should have your eyes and teeth checked. Stay up to date on all vaccines. This information  is not intended to replace advice given to you by your health care provider. Make sure you discuss any questions you have with your health care provider. Document Revised: 12/17/2020 Document Reviewed: 12/17/2020 Elsevier Patient Education  2024 Elsevier Inc.    Signed,   Caro Christmas, MD [facility], [facility] 11/16/23 3:34 PM

## 2023-11-16 NOTE — Patient Instructions (Signed)
 No medication changes at this time.  If any concerns on labs I will let you know.  Keep follow-up with your endocrinologist as planned and let me know if there are any questions from today's visit.  Take care!  Preventive Care 55-55 Years Old, Male Preventive care refers to lifestyle choices and visits with your health care provider that can promote health and wellness. Preventive care visits are also called wellness exams. What can I expect for my preventive care visit? Counseling During your preventive care visit, your health care provider may ask about your: Medical history, including: Past medical problems. Family medical history. Current health, including: Emotional well-being. Home life and relationship well-being. Sexual activity. Lifestyle, including: Alcohol, nicotine or tobacco, and drug use. Access to firearms. Diet, exercise, and sleep habits. Safety issues such as seatbelt and bike helmet use. Sunscreen use. Work and work Astronomer. Physical exam Your health care provider will check your: Height and weight. These may be used to calculate your BMI (body mass index). BMI is a measurement that tells if you are at a healthy weight. Waist circumference. This measures the distance around your waistline. This measurement also tells if you are at a healthy weight and may help predict your risk of certain diseases, such as type 2 diabetes and high blood pressure. Heart rate and blood pressure. Body temperature. Skin for abnormal spots. What immunizations do I need?  Vaccines are usually given at various ages, according to a schedule. Your health care provider will recommend vaccines for you based on your age, medical history, and lifestyle or other factors, such as travel or where you work. What tests do I need? Screening Your health care provider may recommend screening tests for certain conditions. This may include: Lipid and cholesterol levels. Diabetes screening. This is  done by checking your blood sugar (glucose) after you have not eaten for a while (fasting). Hepatitis B test. Hepatitis C test. HIV (human immunodeficiency virus) test. STI (sexually transmitted infection) testing, if you are at risk. Lung cancer screening. Prostate cancer screening. Colorectal cancer screening. Talk with your health care provider about your test results, treatment options, and if necessary, the need for more tests. Follow these instructions at home: Eating and drinking  Eat a diet that includes fresh fruits and vegetables, whole grains, lean protein, and low-fat dairy products. Take vitamin and mineral supplements as recommended by your health care provider. Do not drink alcohol if your health care provider tells you not to drink. If you drink alcohol: Limit how much you have to 0-2 drinks a day. Know how much alcohol is in your drink. In the [place] one drink equals one 12 oz bottle of beer (355 mL), one 5 oz glass of wine (148 mL), or one 1 oz glass of hard liquor (44 mL). Lifestyle Brush your teeth every morning and night with fluoride toothpaste. Floss one time each day. Exercise for at least 30 minutes 5 or more days each week. Do not use any products that contain nicotine or tobacco. These products include cigarettes, chewing tobacco, and vaping devices, such as e-cigarettes. If you need help quitting, ask your health care provider. Do not use drugs. If you are sexually active, practice safe sex. Use a condom or other form of protection to prevent STIs. Take aspirin only as told by your health care provider. Make sure that you understand how much to take and what form to take. Work with your health care provider to find out whether it is safe  and beneficial for you to take aspirin daily. Find healthy ways to manage stress, such as: Meditation, yoga, or listening to music. Journaling. Talking to a trusted person. Spending time with friends and family. Minimize  exposure to UV radiation to reduce your risk of skin cancer. Safety Always wear your seat belt while driving or riding in a vehicle. Do not drive: If you have been drinking alcohol. Do not ride with someone who has been drinking. When you are tired or distracted. While texting. If you have been using any mind-altering substances or drugs. Wear a helmet and other protective equipment during sports activities. If you have firearms in your house, make sure you follow all gun safety procedures. What's next? Go to your health care provider once a year for an annual wellness visit. Ask your health care provider how often you should have your eyes and teeth checked. Stay up to date on all vaccines. This information is not intended to replace advice given to you by your health care provider. Make sure you discuss any questions you have with your health care provider. Document Revised: 12/17/2020 Document Reviewed: 12/17/2020 Elsevier Patient Education  2024 [employer].

## 2023-11-17 LAB — COMPREHENSIVE METABOLIC PANEL WITH GFR
ALT: 24 U/L (ref 0–53)
AST: 17 U/L (ref 0–37)
Albumin: 4.5 g/dL (ref 3.5–5.2)
Alkaline Phosphatase: 50 U/L (ref 39–117)
BUN: 16 mg/dL (ref 6–23)
CO2: 28 meq/L (ref 19–32)
Calcium: 9 mg/dL (ref 8.4–10.5)
Chloride: 102 meq/L (ref 96–112)
Creatinine, Ser: 1.09 mg/dL (ref 0.40–1.50)
GFR: 76.72 mL/min (ref >60.00)
Glucose, Bld: 92 mg/dL (ref 70–99)
Potassium: 4.1 meq/L (ref 3.5–5.1)
Sodium: 138 meq/L (ref 135–145)
Total Bilirubin: 1.3 mg/dL — ABNORMAL HIGH (ref 0.2–1.2)
Total Protein: 7.1 g/dL (ref 6.0–8.3)

## 2023-11-17 LAB — MICROALBUMIN / CREATININE URINE RATIO
Creatinine,U: 177.3 mg/dL
Microalb Creat Ratio: 5.7 mg/g (ref 0.0–30.0)
Microalb, Ur: 1 mg/dL (ref 0.0–1.9)

## 2023-11-17 LAB — LIPID PANEL
Cholesterol: 125 mg/dL (ref 0–200)
HDL: 38.1 mg/dL — ABNORMAL LOW (ref >39.00)
LDL Cholesterol: 67 mg/dL (ref 0–99)
NonHDL: 86.76
Total CHOL/HDL Ratio: 3
Triglycerides: 99 mg/dL (ref 0.0–149.0)
VLDL: 19.8 mg/dL (ref 0.0–40.0)

## 2023-11-17 LAB — HEMOGLOBIN A1C: Hgb A1c MFr Bld: 6.3 % (ref 4.6–6.5)

## 2023-11-19 ENCOUNTER — Encounter: Payer: Self-pay | Admitting: Family Medicine

## 2023-11-21 ENCOUNTER — Ambulatory Visit: Payer: Self-pay | Admitting: Family Medicine

## 2023-12-03 ENCOUNTER — Other Ambulatory Visit: Payer: Self-pay | Admitting: Family Medicine

## 2023-12-03 DIAGNOSIS — N529 Male erectile dysfunction, unspecified: Secondary | ICD-10-CM

## 2024-04-02 ENCOUNTER — Encounter: Payer: Self-pay | Admitting: Internal Medicine

## 2024-04-02 ENCOUNTER — Ambulatory Visit (INDEPENDENT_AMBULATORY_CARE_PROVIDER_SITE_OTHER): Payer: BC Managed Care – PPO | Admitting: Internal Medicine

## 2024-04-02 VITALS — BP 136/88 | HR 76 | Ht 75.25 in | Wt 255.8 lb

## 2024-04-02 DIAGNOSIS — E890 Postprocedural hypothyroidism: Secondary | ICD-10-CM | POA: Diagnosis not present

## 2024-04-02 DIAGNOSIS — Z85850 Personal history of malignant neoplasm of thyroid: Secondary | ICD-10-CM

## 2024-04-02 NOTE — Progress Notes (Unsigned)
 Name: Tyrone Lee [facility]  MRN/ DOB: [id number], 1971/05/23    Age/ Sex: 53 y.o., male     PCP: Levora Reyes SAUNDERS, MD   Reason for Endocrinology Evaluation: PTC/Hypothyroidism     Initial Endocrinology Clinic Visit: 07/27/2018    PATIENT IDENTIFIER: [name] is a 53 y.o., male with a past medical history of hx of PTC. He has followed with [facility] since 07/27/2018 for consultative assistance with management of his Hx of PTC and postoperative Hypothyroidism.   HISTORICAL SUMMARY:  1/20: thyroidectomy and RND: multifocal PTC, largest focus is 3.2 cm (T2N1).   2/20: RAI, 127 mCi 2/20: post-therapy scan: small focus in the neck.   8/20 Tg undetectable (ab pos).   8/21 Tg undetectable (ab pos). 9/21 US  no nodule or thyroid  tissue 3/22 Tg undetectable (ab pos) 9/22 Tg undetectable (ab pos).  3/23 Tg undetectable (ab pos) 09/2022 Tg undetectable 03/2023 Tg <0.2 (Pos Ab)   Hx of Hyperparathyroidism, dx'ed 2019, parathyroid adenoma was removed during thyroid  surgery and his calcium  has normalized since    Patient was followed by Dr. Kassie from 2020 until 09/2021  ED: Patient presented to his PCP with complaints of erectile dysfunction, despite being on Viagra .  His testosterone  level was noted to be low at 235 NG/DL which was drawn at 6:49 PM Repeat testosterone  a week later around 10 AM continue to show low testosterone  at 289.59 NG/DL   Patient had a repeat testosterone  at our clinic in the fasting status close to 8 AM which was normal at 306 NG/DL, with normal prolactin and LH    SUBJECTIVE:    Today (04/02/2024):  [name] is here for a follow-up on postoperative hypothyroid and Hx of PTC.     No local neck swelling  No palpitations  No tremors  No constipation but has occasional diarrhea  Has epigastric pain on occasions    Levothyroxine  150 mcg daily  Vitamin D  1000 iu daily     HISTORY:  Past Medical History:  Past Medical  History:  Diagnosis Date   Acid reflux    on meds   Diabetes mellitus without complication (HCC)    on meds   Hemorrhoids    Hyperlipidemia    Hypertension    Past Surgical History:  Past Surgical History:  Procedure Laterality Date   RADICAL NECK DISSECTION Right 07/19/2018   Procedure: RIGHT NECK DISSECTION;  Surgeon: Ethyl Lonni BRAVO, MD;  Location: [facility] OR;  Service: ENT;  Laterality: Right;   THYROIDECTOMY Right 07/19/2018   Procedure: TOTAL THYROIDECTOMY AND RIGHT NECK DISSECTION;  Surgeon: Ethyl Lonni BRAVO, MD;  Location: [facility] OR;  Service: ENT;  Laterality: Right;   Social History:  reports that he has never smoked. He has never used smokeless tobacco. He reports that he does not currently use alcohol. He reports that he does not use drugs. Family History:  Family History  Problem Relation Age of Onset   Alcoholism Mother    Drug abuse Mother    Alcoholism Father    Lung cancer Maternal Grandfather    Lung cancer Paternal Grandfather    Diabetes Maternal Grandmother    Colon cancer Neg Hx    Stomach cancer Neg Hx    Esophageal cancer Neg Hx    Thyroid  disease Neg Hx      HOME MEDICATIONS: Allergies as of 04/02/2024   No Known Allergies      Medication List  Accurate as of April 02, 2024  3:02 PM. If you have any questions, ask your nurse or doctor.          Accu-Chek Guide test strip Generic drug: glucose blood TEST UP TO FOUR TIMES A DAY AS DIRECTED (START WITH TESTING ONCE PER DAY)   atorvastatin  20 MG tablet Commonly known as: LIPITOR TAKE 1 TABLET BY MOUTH DAILY AT 6 PM.   blood glucose meter kit and supplies Dispense based on patient and insurance preference. Use up to four times daily as directed. (FOR ICD-10 E10.9, E11.9). Once per day for now.   CVS D3 50 MCG (2000 UT) Caps Generic drug: Cholecalciferol  TAKE 1 CAPSULE BY MOUTH EVERY DAY   levothyroxine  150 MCG tablet Commonly known as: SYNTHROID  Take 1 tablet (150 mcg  total) by mouth daily.   lisinopril  10 MG tablet Commonly known as: ZESTRIL  Take 1 tablet (10 mg total) by mouth daily.   metFORMIN  1000 MG tablet Commonly known as: GLUCOPHAGE  Take 1 tablet (1,000 mg total) by mouth 2 (two) times daily with a meal.   omeprazole  40 MG capsule Commonly known as: PRILOSEC TAKE ONE PILL ,40 MG, ONE HOUR BEFORE BREAKFAST. TAKE FOR AT LEAST 6 WEEKS TO PROMOTE ESOPHAGEAL HEALING.   sildenafil  100 MG tablet Commonly known as: Viagra  Take 0.5-1 tablets (50-100 mg total) by mouth daily as needed for erectile dysfunction.   sildenafil  50 MG tablet Commonly known as: VIAGRA  TAKE 0.5-1 TABLETS (25-50 MG TOTAL) BY MOUTH DAILY AS NEEDED FOR ERECTILE DYSFUNCTION.   Trulicity  1.5 MG/0.5ML Soaj Generic drug: Dulaglutide  Inject 1.5 mg into the skin once a week.          OBJECTIVE:   PHYSICAL EXAM: VS: BP 136/88 (BP Location: Left Arm, Patient Position: Sitting, Cuff Size: Large)   Pulse 76   Ht 6' 3.25 (1.911 m)   Wt 255 lb 12.8 oz (116 kg)   SpO2 97%   BMI 31.76 kg/m    EXAM: General: Pt appears well and is in NAD  Neck: General: Supple without adenopathy. Thyroid :  No goiter or nodules appreciated.  Lungs: Clear with good BS bilat   Heart: Auscultation: RRR.  Abdomen: soft, nontender  Genital: Normal external genital exam, bilateral testicular volume 25 mL  Extremities:  BL LE: No pretibial edema   Mental Status: Judgment, insight: Intact Orientation: Oriented to time, place, and person Mood and affect: No depression, anxiety, or agitation     DATA REVIEWED:     Latest Reference Range & Units 03/30/23 14:54  TSH 0.35 - 5.50 uIU/mL 0.10 (L)    Latest Reference Range & Units 09/24/22 15:00  Thyroglobulin by LCMS 1.4 - 29.2 ng/mL <0.2 (L)     Thyroid  ultrasound 09/24/2022  FINDINGS: Isthmus: Surgically absent. There is no residual nodular soft tissue within the isthmic resection bed.   Right lobe: Surgically absent. There is  no residual nodular soft tissue within the right lobectomy resection bed.   Left lobe: Surgically absent. There is no residual nodular soft tissue within the left lobectomy resection bed.   _________________________________________________________   No regional cervical lymphadenopathy.   IMPRESSION: Post total thyroidectomy without evidence of residual or locally recurrent disease.    Thyroid  Pathology 07/19/2018   1. Thyroid , lobectomy, Right lobe - PAPILLARY CARCINOMA, MULTIFOCAL, LARGEST TUMOR IS 3.2 CM. - EXTRATHYROIDAL EXTENSION IS PRESENT. - MARGINS OF RESECTION ARE NOT INVOLVED. - METASTATIC CARCINOMA PRESENT IN ONE OF ONE LYMPH NODE (1/1). - SEE ONCOLOGY TABLE. 2. Lymph  node, biopsy, Inferior Right - METASTATIC CARCINOMA PRESENT IN ONE OF SEVEN LYMPH NODES (1/7). 3. Thyroid , lobectomy, Left and central neck - CHRONIC LYMPHOCYTIC THYROIDITIS. - NO CARCINOMA IDENTIFIED IN EIGHT LYMPH NODES (0/8). 4. Parathyroid gland, Questionable Right - HYPERCELLULAR PARATHYROID TISSUE, 1.5 GRAMS. 5. Lymph nodes, radical neck dissection - METASTATIC CARCINOMA PRESENT IN THREE OF FIFTEEN LYMPH NODES (3/15). - EXTRANODAL EXTENSION IS PRESENT. Microscopic Comment 1. THYROID  GLAND: Procedure: Total thyroidectomy Tumor Focality: Multifocal Tumor Site: Right lobe Tumor Size: 3.2 cm Histologic Type: Papillary carcinoma, classic (usual, conventional) Margins: Uninvolved by carcinoma Distance of invasive carcinoma from closest margin: Less than 1 mm from the black ink Angioinvasion: Not identified Lymphatic Invasion: Present Extrathyroidal Extension: Present, microscopic Regional Lymph Nodes:   Microscopic Comment(continued) Number of Lymph Nodes Involved: 5 Nodal Levels Involved: One lymph node at the superior aspect of the right thyroid  lobectomy (specimen 1) One lymph node from the specimen submitted as inferior right (specimen 2) Three lymph nodes from the radical neck  dissection (specimen 5) Size of Largest Metastatic Deposit: 3.5 cm Extranodal Extension (ENE): Present Number of Lymph Nodes Examined: 31 Nodal Levels Examined: One lymph node at the superior aspect of the right thyroid  lobectomy (specimen 1) Seven lymph nodes from the specimen submitted as inferior right (specimen 2) Eight lymph nodes from the specimen submitted as left and central neck thyroidectomy (specimen 3) Fifteen lymph nodes from the radical neck dissection (specimen 5) Pathologic Stage Classification (pTNM, AJCC 8th Edition): mpT2, pN1  Old records , labs and images have been reviewed.   ASSESSMENT / PLAN / RECOMMENDATIONS:   Hx PTC:  -Patient was at high risk for recurrence based on metastasis to a lymph node of 3.5 cm in largest dimension on his initial pathology report -Historically he has had positive TG antibodies, with low thyroglobulin level -Repeat thyroid  bed ultrasound 10/2022 , did not show any evidence of recurrence - TSH goal ~ 0.1 -TG AB remains elevated, TG has been low <0.2 ng/mL   2.  Postoperative hypothyroid:  -Patient is clinically euthyroid -TSH at goal  Medication Continue levothyroxine  150 mcg daily    Follow-up in     Signed electronically by: Stefano Redgie Butts, MD  [facility]  [facility] [address] [place] [place], [place] [address] Phone: [phone] FAX: [phone]      CC: Levora Reyes SAUNDERS, MD [id number] [place] [address] Phone: [phone]  Fax: [phone]   Return to Endocrinology clinic as below: Future Appointments  Date Time Provider Department Center  05/24/2024  3:00 PM Levora Reyes SAUNDERS, MD [facility]  11/19/2024  3:00 PM Levora Reyes SAUNDERS, MD [facility]

## 2024-04-02 NOTE — Patient Instructions (Signed)

## 2024-04-04 ENCOUNTER — Ambulatory Visit: Payer: Self-pay | Admitting: Internal Medicine

## 2024-04-04 ENCOUNTER — Other Ambulatory Visit: Payer: Self-pay

## 2024-04-04 MED ORDER — LEVOTHYROXINE SODIUM 150 MCG PO TABS: 150.0000 ug | ORAL_TABLET | Freq: Every day | ORAL | 3 refills | Status: AC

## 2024-04-04 NOTE — Telephone Encounter (Signed)
 Please contact the patient and schedule him for repeat labs only appointment in 2 months    Thanks

## 2024-04-08 LAB — THYROGLOBULIN, LC/MS/MS: THYROGLOBULIN, LC/MS/MS: <0.4 ng/mL

## 2024-04-08 LAB — TSH: TSH: 0.33 m[IU]/L — ABNORMAL LOW (ref 0.40–4.50)

## 2024-05-24 ENCOUNTER — Ambulatory Visit (INDEPENDENT_AMBULATORY_CARE_PROVIDER_SITE_OTHER): Admitting: Family Medicine

## 2024-05-24 ENCOUNTER — Encounter: Payer: Self-pay | Admitting: Family Medicine

## 2024-05-24 VITALS — BP 118/88 | HR 76 | Temp 99.1°F | Resp 16 | Ht 75.25 in | Wt 251.6 lb

## 2024-05-24 DIAGNOSIS — I10 Essential (primary) hypertension: Secondary | ICD-10-CM | POA: Diagnosis not present

## 2024-05-24 DIAGNOSIS — E785 Hyperlipidemia, unspecified: Secondary | ICD-10-CM | POA: Diagnosis not present

## 2024-05-24 DIAGNOSIS — Z7985 Long-term (current) use of injectable non-insulin antidiabetic drugs: Secondary | ICD-10-CM

## 2024-05-24 DIAGNOSIS — N529 Male erectile dysfunction, unspecified: Secondary | ICD-10-CM

## 2024-05-24 DIAGNOSIS — E1165 Type 2 diabetes mellitus with hyperglycemia: Secondary | ICD-10-CM | POA: Diagnosis not present

## 2024-05-24 MED ORDER — METFORMIN HCL 1000 MG PO TABS: 1000.0000 mg | ORAL_TABLET | Freq: Two times a day (BID) | ORAL | 2 refills | Status: AC

## 2024-05-24 MED ORDER — ATORVASTATIN CALCIUM 20 MG PO TABS: ORAL_TABLET | ORAL | 2 refills | Status: AC

## 2024-05-24 MED ORDER — SILDENAFIL CITRATE 100 MG PO TABS: 50.0000 mg | ORAL_TABLET | Freq: Every day | ORAL | 11 refills | Status: AC | PRN

## 2024-05-24 MED ORDER — TRULICITY 1.5 MG/0.5ML ~~LOC~~ SOAJ: 1.5000 mg | SUBCUTANEOUS | 2 refills | Status: AC

## 2024-05-24 MED ORDER — LISINOPRIL 10 MG PO TABS: 10.0000 mg | ORAL_TABLET | Freq: Every day | ORAL | 2 refills | Status: AC

## 2024-05-24 NOTE — Progress Notes (Signed)
 Subjective:  Patient ID: Tyrone Lee, male    DOB: [birth year]  Age: 53 y.o. MRN: [id number]  CC:  Chief Complaint  Patient presents with   Diabetes    No questions or concerns.    Hypertension    HPI Tyrone Lee presents for   Diabetes: Complicated by history of hyperglycemia but well-controlled on most recent labs with use of metformin  1000 mg twice daily, Trulicity  1.5 mg weekly and he is on ACE inhibitor, statin. He is under the care of endocrinology for postoperative hypothyroidism and papillary thyroid  cancer, with recent office visit noted September 29.  TSH goal approximately 0.5.  Prior thyroid  bed ultrasound April 2024 without evidence of recurrence.  Levothyroxine  was slightly increased to approach goal above.  Repeat TSH planned in 2 months.  0.33 on September 29.  Tumor markers were continued to be undetectable on his September 29 visit. No new side effects with his medications above. Occasional diarrhea few times per month only.  No home readings. No symptoms of hypoglycemia.   Microalbumin: Normal ratio 11/16/2023 Optho, foot exam, pneumovax:  Ophthalmology exam: declines.  Flu vaccine, pneumonia vaccine, hepatitis C and HIV screening were all declined.  Hepatitis B vaccination declined.  Covid booster planned at pharmacy tomorrow.   Lab Results  Component Value Date   HGBA1C 6.3 11/16/2023   HGBA1C 6.0 05/19/2023   HGBA1C 6.1 11/11/2022   Lab Results  Component Value Date   MICROALBUR 1.0 11/16/2023   LDLCALC 67 11/16/2023   CREATININE 1.09 11/16/2023   Hypertension: Lisinopril  10 mg daily.  No new side effects. Home readings: BP Readings from Last 3 Encounters:  05/24/24 118/88  04/02/24 136/88  11/16/23 126/78   Lab Results  Component Value Date   CREATININE 1.09 11/16/2023   Hyperlipidemia: Lipitor 20 mg daily. No myalgias, side effects.  Lab Results  Component Value Date   CHOL 125 11/16/2023   HDL 38.10 (L) 11/16/2023   LDLCALC  67 11/16/2023   TRIG 99.0 11/16/2023   CHOLHDL 3 11/16/2023   Lab Results  Component Value Date   ALT 24 11/16/2023   AST 17 11/16/2023   ALKPHOS 50 11/16/2023   BILITOT 1.3 (H) 11/16/2023   Erectile dysfunction Effective with 100mg  dose. No vision or hearing changes, no chest pain or dyspnea with exertion.    History Patient Active Problem List   Diagnosis Date Noted   Type 2 diabetes mellitus with hyperglycemia, without long-term current use of insulin (HCC) 11/11/2022   Hyperlipidemia 11/11/2022   Essential hypertension 11/11/2022   Erectile dysfunction 11/11/2022   Hx of papillary thyroid  carcinoma 09/24/2022   Hyperparathyroidism 07/27/2018   Hypothyroidism 07/27/2018   Thyroid  cancer (HCC) 07/19/2018   Past Medical History:  Diagnosis Date   Acid reflux    on meds   Diabetes mellitus without complication (HCC)    on meds   Hemorrhoids    Hyperlipidemia    Hypertension    Past Surgical History:  Procedure Laterality Date   RADICAL NECK DISSECTION Right 07/19/2018   Procedure: RIGHT NECK DISSECTION;  Surgeon: Ethyl Lonni BRAVO, MD;  Location: [facility] OR;  Service: ENT;  Laterality: Right;   THYROIDECTOMY Right 07/19/2018   Procedure: TOTAL THYROIDECTOMY AND RIGHT NECK DISSECTION;  Surgeon: Ethyl Lonni BRAVO, MD;  Location: [facility] OR;  Service: ENT;  Laterality: Right;   No Known Allergies Prior to Admission medications   Medication Sig Start Date End Date Taking? Authorizing Provider  ACCU-CHEK GUIDE test strip TEST  UP TO FOUR TIMES A DAY AS DIRECTED (START WITH TESTING ONCE PER DAY) 04/21/18  Yes Levora Tyrone SAUNDERS, MD  atorvastatin  (LIPITOR) 20 MG tablet TAKE 1 TABLET BY MOUTH DAILY AT 6 PM. 11/16/23  Yes Levora Tyrone SAUNDERS, MD  blood glucose meter kit and supplies Dispense based on patient and insurance preference. Use up to four times daily as directed. (FOR ICD-10 E10.9, E11.9). Once per day for now. 08/04/17  Yes Levora Tyrone SAUNDERS, MD  CVS D3 50 MCG (2000 UT)  CAPS TAKE 1 CAPSULE BY MOUTH EVERY DAY 09/04/19  Yes Kassie Mallick, MD  Dulaglutide  (TRULICITY ) 1.5 MG/0.5ML SOAJ Inject 1.5 mg into the skin once a week. 11/16/23  Yes Levora Tyrone SAUNDERS, MD  levothyroxine  (SYNTHROID ) 150 MCG tablet Take 1 tablet (150 mcg total) by mouth daily. 04/04/24  Yes Shamleffer, Ibtehal Jaralla, MD  lisinopril  (ZESTRIL ) 10 MG tablet Take 1 tablet (10 mg total) by mouth daily. 11/16/23  Yes Levora Tyrone SAUNDERS, MD  metFORMIN  (GLUCOPHAGE ) 1000 MG tablet Take 1 tablet (1,000 mg total) by mouth 2 (two) times daily with a meal. 11/16/23  Yes Levora Tyrone SAUNDERS, MD  omeprazole  (PRILOSEC) 40 MG capsule TAKE ONE PILL ,40 MG, ONE HOUR BEFORE BREAKFAST. TAKE FOR AT LEAST 6 WEEKS TO PROMOTE ESOPHAGEAL HEALING. 03/20/21  Yes Pyrtle, Gordy HERO, MD  sildenafil  (VIAGRA ) 100 MG tablet Take 0.5-1 tablets (50-100 mg total) by mouth daily as needed for erectile dysfunction. 05/19/23  Yes Levora Tyrone SAUNDERS, MD  sildenafil  (VIAGRA ) 50 MG tablet TAKE 0.5-1 TABLETS (25-50 MG TOTAL) BY MOUTH DAILY AS NEEDED FOR ERECTILE DYSFUNCTION. Patient not taking: Reported on 05/24/2024 12/05/23   Levora Tyrone SAUNDERS, MD   Social History   Socioeconomic History   Marital status: Married    Spouse name: Not on file   Number of children: 2   Years of education: Not on file   Highest education level: Some college, no degree  Occupational History   Not on file  Tobacco Use   Smoking status: Never   Smokeless tobacco: Never  Vaping Use   Vaping status: Never Used  Substance and Sexual Activity   Alcohol use: Not Currently    Comment: weekends, occasional   Drug use: No   Sexual activity: Yes  Other Topics Concern   Not on file  Social History Narrative   Not on file   Social Drivers of Health   Financial Resource Strain: Low Risk  (05/23/2024)   Overall Financial Resource Strain (CARDIA)    Difficulty of Paying Living Expenses: Not hard at all  Food Insecurity: No Food Insecurity (05/23/2024)   Hunger Vital  Sign    Worried About Running Out of Food in the Last Year: Never true    Ran Out of Food in the Last Year: Never true  Transportation Needs: No Transportation Needs (05/23/2024)   PRAPARE - Administrator, Civil Service (Medical): No    Lack of Transportation (Non-Medical): No  Physical Activity: Insufficiently Active (05/23/2024)   Exercise Vital Sign    Days of Exercise per Week: 7 days    Minutes of Exercise per Session: 20 min  Stress: No Stress Concern Present (05/23/2024)   [employer] of Occupational Health - Occupational Stress Questionnaire    Feeling of Stress: Not at all  Social Connections: Socially Isolated (05/23/2024)   Social Connection and Isolation Panel    Frequency of Communication with Friends and Family: Twice a week    Frequency of  Social Gatherings with Friends and Family: Once a week    Attends Religious Services: Never    Database Administrator or Organizations: No    Attends Engineer, Structural: Not on file    Marital Status: Divorced  Intimate Partner Violence: Not on file    Review of Systems  Constitutional:  Negative for fatigue and unexpected weight change.  Eyes:  Negative for visual disturbance.  Respiratory:  Negative for cough, chest tightness and shortness of breath.   Cardiovascular:  Negative for chest pain, palpitations and leg swelling.  Gastrointestinal:  Negative for abdominal pain and blood in stool.  Neurological:  Negative for dizziness, light-headedness and headaches.     Objective:   Vitals:   05/24/24 1444  BP: 118/88  Pulse: 76  Resp: 16  Temp: 99.1 F (37.3 C)  TempSrc: Temporal  SpO2: 97%  Weight: 251 lb 9.6 oz (114.1 kg)  Height: 6' 3.25 (1.911 m)     Physical Exam Vitals reviewed.  Constitutional:      Appearance: He is well-developed.  HENT:     Head: Normocephalic and atraumatic.  Neck:     Vascular: No carotid bruit or JVD.  Cardiovascular:     Rate and Rhythm: Normal rate  and regular rhythm.     Heart sounds: Normal heart sounds. No murmur heard. Pulmonary:     Effort: Pulmonary effort is normal.     Breath sounds: Normal breath sounds. No rales.  Musculoskeletal:     Right lower leg: No edema.     Left lower leg: No edema.  Skin:    General: Skin is warm and dry.  Neurological:     Mental Status: He is alert and oriented to person, place, and time.  Psychiatric:        Mood and Affect: Mood normal.        Assessment & Plan:  Tyrone Lee is a 53 y.o. male . Type 2 diabetes mellitus with hyperglycemia, without long-term current use of insulin (HCC) - Plan: Dulaglutide  (TRULICITY ) 1.5 MG/0.5ML SOAJ, metFORMIN  (GLUCOPHAGE ) 1000 MG tablet, Hemoglobin A1c  -  Stable, tolerating current regimen. Medications refilled. Labs pending as above.  Reasons for ophthalmology exam discussed, specifically diabetic retinopathy screening, declined at this time.  If more frequent diarrhea would consider extended release formulation of metformin .  No changes for now.  Hyperlipidemia, unspecified hyperlipidemia type - Plan: atorvastatin  (LIPITOR) 20 MG tablet, Comprehensive metabolic panel with GFR, Lipid panel  - Tolerating current dose of Lipitor.  Check labs and adjust medicines accordingly.  Essential hypertension - Plan: lisinopril  (ZESTRIL ) 10 MG tablet, Comprehensive metabolic panel with GFR  - Stable with current regimen.  Continue same, check labs and adjust plan accordingly.  Erectile dysfunction, unspecified erectile dysfunction type - Plan: sildenafil  (VIAGRA ) 100 MG tablet  -viagra  Rx given - use lowest effective dose. Side effects discussed (including but not limited to headache/flushing, blue discoloration of vision, possible vascular steal and risk of cardiac effects if underlying unknown coronary artery disease, and permanent sensorineural hearing loss). Understanding expressed.   Meds ordered this encounter  Medications   atorvastatin  (LIPITOR)  20 MG tablet    Sig: TAKE 1 TABLET BY MOUTH DAILY AT 6 PM.    Dispense:  90 tablet    Refill:  2   Dulaglutide  (TRULICITY ) 1.5 MG/0.5ML SOAJ    Sig: Inject 1.5 mg into the skin once a week.    Dispense:  6 mL    Refill:  2   lisinopril  (ZESTRIL ) 10 MG tablet    Sig: Take 1 tablet (10 mg total) by mouth daily.    Dispense:  90 tablet    Refill:  2    DX Code Needed  .   metFORMIN  (GLUCOPHAGE ) 1000 MG tablet    Sig: Take 1 tablet (1,000 mg total) by mouth 2 (two) times daily with a meal.    Dispense:  180 tablet    Refill:  2   sildenafil  (VIAGRA ) 100 MG tablet    Sig: Take 0.5-1 tablets (50-100 mg total) by mouth daily as needed for erectile dysfunction.    Dispense:  5 tablet    Refill:  11   Patient Instructions  Thank you for coming in today. No change in medications at this time. If there are any concerns on your bloodwork, I will let you know. Take care!     Signed,   Tyrone Pines, MD [facility], [facility] 05/24/24 3:41 PM

## 2024-05-24 NOTE — Patient Instructions (Signed)
 Thank you for coming in today. No change in medications at this time. If there are any concerns on your bloodwork, I will let you know. Take care!

## 2024-05-25 LAB — COMPREHENSIVE METABOLIC PANEL WITH GFR
ALT: 20 U/L (ref 0–53)
AST: 17 U/L (ref 0–37)
Albumin: 4.6 g/dL (ref 3.5–5.2)
Alkaline Phosphatase: 58 U/L (ref 39–117)
BUN: 13 mg/dL (ref 6–23)
CO2: 28 meq/L (ref 19–32)
Calcium: 9.2 mg/dL (ref 8.4–10.5)
Chloride: 103 meq/L (ref 96–112)
Creatinine, Ser: 1.24 mg/dL (ref 0.40–1.50)
GFR: 65.48 mL/min (ref >60.00)
Glucose, Bld: 90 mg/dL (ref 70–99)
Potassium: 4 meq/L (ref 3.5–5.1)
Sodium: 138 meq/L (ref 135–145)
Total Bilirubin: 1.1 mg/dL (ref 0.2–1.2)
Total Protein: 7.6 g/dL (ref 6.0–8.3)

## 2024-05-25 LAB — LIPID PANEL
Cholesterol: 129 mg/dL (ref 0–200)
HDL: 35.6 mg/dL — ABNORMAL LOW (ref >39.00)
LDL Cholesterol: 73 mg/dL (ref 0–99)
NonHDL: 92.99
Total CHOL/HDL Ratio: 4
Triglycerides: 99 mg/dL (ref 0.0–149.0)
VLDL: 19.8 mg/dL (ref 0.0–40.0)

## 2024-05-25 LAB — HEMOGLOBIN A1C: Hgb A1c MFr Bld: 6.3 % (ref 4.6–6.5)

## 2024-05-29 ENCOUNTER — Ambulatory Visit: Payer: Self-pay | Admitting: Family Medicine

## 2024-06-14 ENCOUNTER — Other Ambulatory Visit

## 2024-11-19 ENCOUNTER — Encounter: Admitting: Family Medicine

## 2025-04-02 ENCOUNTER — Ambulatory Visit: Admitting: Internal Medicine
